# Patient Record
Sex: Male | Born: 1965 | Race: White | Hispanic: No | Marital: Married | Smoking: Former smoker
Health system: Southern US, Community
[De-identification: ages and names within clinical notes are randomized; demographics above are authoritative.]

## PROBLEM LIST (undated history)

## (undated) DIAGNOSIS — M1990 Unspecified osteoarthritis, unspecified site: Secondary | ICD-10-CM

## (undated) DIAGNOSIS — K5792 Diverticulitis of intestine, part unspecified, without perforation or abscess without bleeding: Secondary | ICD-10-CM

## (undated) DIAGNOSIS — M519 Unspecified thoracic, thoracolumbar and lumbosacral intervertebral disc disorder: Secondary | ICD-10-CM

## (undated) DIAGNOSIS — S82851A Displaced trimalleolar fracture of right lower leg, initial encounter for closed fracture: Secondary | ICD-10-CM

## (undated) HISTORY — PX: TOTAL HIP ARTHROPLASTY: SHX124

## (undated) HISTORY — PX: ABDOMINAL SURGERY: SHX537

---

## 2013-02-15 ENCOUNTER — Emergency Department (HOSPITAL_COMMUNITY): Payer: Self-pay

## 2013-02-15 ENCOUNTER — Telehealth (HOSPITAL_COMMUNITY): Payer: Self-pay | Admitting: Emergency Medicine

## 2013-02-15 ENCOUNTER — Encounter (HOSPITAL_COMMUNITY): Payer: Self-pay | Admitting: Emergency Medicine

## 2013-02-15 ENCOUNTER — Emergency Department (HOSPITAL_COMMUNITY)
Admission: EM | Admit: 2013-02-15 | Discharge: 2013-02-15 | Disposition: A | Discharge status: Home / Self Care | Payer: Self-pay | Attending: Emergency Medicine | Admitting: Emergency Medicine

## 2013-02-15 DIAGNOSIS — Z8739 Personal history of other diseases of the musculoskeletal system and connective tissue: Secondary | ICD-10-CM | POA: Insufficient documentation

## 2013-02-15 DIAGNOSIS — M545 Low back pain: Secondary | ICD-10-CM

## 2013-02-15 DIAGNOSIS — IMO0002 Reserved for concepts with insufficient information to code with codable children: Secondary | ICD-10-CM | POA: Insufficient documentation

## 2013-02-15 DIAGNOSIS — M129 Arthropathy, unspecified: Secondary | ICD-10-CM | POA: Insufficient documentation

## 2013-02-15 HISTORY — DX: Unspecified osteoarthritis, unspecified site: M19.90

## 2013-02-15 HISTORY — DX: Unspecified thoracic, thoracolumbar and lumbosacral intervertebral disc disorder: M51.9

## 2013-02-15 HISTORY — DX: Diverticulitis of intestine, part unspecified, without perforation or abscess without bleeding: K57.92

## 2013-02-15 MED ORDER — OXYCODONE-ACETAMINOPHEN 5-325 MG PO TABS
2.0000 | ORAL_TABLET | Freq: Once | ORAL | Status: AC
  Administered 2013-02-15: 2 via ORAL
  Filled 2013-02-15: qty 2

## 2013-02-15 MED ORDER — KETOROLAC TROMETHAMINE 60 MG/2ML IM SOLN
60.0000 mg | Freq: Once | INTRAMUSCULAR | Status: AC
  Administered 2013-02-15: 60 mg via INTRAMUSCULAR
  Filled 2013-02-15: qty 2

## 2013-02-15 MED ORDER — PREDNISONE 20 MG PO TABS: 40.0000 mg | ORAL_TABLET | Freq: Every day | ORAL | Status: DC

## 2013-02-15 MED ORDER — DIAZEPAM 5 MG PO TABS
5.0000 mg | ORAL_TABLET | Freq: Once | ORAL | Status: AC
  Administered 2013-02-15: 5 mg via ORAL
  Filled 2013-02-15: qty 1

## 2013-02-15 MED ORDER — CYCLOBENZAPRINE HCL 10 MG PO TABS: 10.0000 mg | ORAL_TABLET | Freq: Once | ORAL | Status: DC

## 2013-02-15 MED ORDER — OXYCODONE-ACETAMINOPHEN 5-325 MG PO TABS: 2.0000 | ORAL_TABLET | Freq: Once | ORAL | Status: DC

## 2013-02-15 MED ORDER — CYCLOBENZAPRINE HCL 10 MG PO TABS
10.0000 mg | ORAL_TABLET | Freq: Once | ORAL | Status: AC
  Administered 2013-02-15: 10 mg via ORAL
  Filled 2013-02-15: qty 1

## 2013-02-15 NOTE — ED Provider Notes (Signed)
History    CSN: [id number] Arrival date & time 02/15/13  4098  First MD Initiated Contact with Patient 02/15/13 1010     Chief Complaint  Patient presents with  . Back Pain  . Leg Pain    right leg   (Consider location/radiation/quality/duration/timing/severity/associated sxs/prior Treatment) HPI Comments: Patient is a 47 year old male with a past medical history of a ruptured disc who presents with gradual onset of lower back pain that started 4 days ago. The pain is sharp and severe and radiates down bilateral legs. The pain is constant. Movement makes the pain worse. Nothing makes the pain better. Patient has tried tylenol and ibuprofen for pain without relief. No associated symptoms. No saddles paresthesias or bladder/bowel incontinence.     Past Medical History  Diagnosis Date  . Ruptured disk   . Diverticulitis   . Arthritis    Past Surgical History  Procedure Laterality Date  . Abdominal surgery     No family history on file. History  Substance Use Topics  . Smoking status: Never Smoker   . Smokeless tobacco: Not on file  . Alcohol Use: No    Review of Systems  Musculoskeletal: Positive for back pain.  All other systems reviewed and are negative.    Allergies  Review of patient's allergies indicates no known allergies.  Home Medications   Current Outpatient Rx  Name  Route  Sig  Dispense  Refill  . acetaminophen (TYLENOL) 500 MG tablet   Oral   Take 1,000 mg by mouth every 6 (six) hours as needed for pain.         Marland Kitchen ibuprofen (ADVIL,MOTRIN) 200 MG tablet   Oral   Take 800 mg by mouth every 6 (six) hours as needed for pain.         Marland Kitchen senna (SENOKOT) 8.6 MG tablet   Oral   Take 2 tablets by mouth 2 (two) times daily.          BP 165/85  Pulse 62  Temp(Src) 97 F (36.1 C) (Oral)  Resp 20  Ht 6' (1.829 m)  Wt 240 lb (108.863 kg)  BMI 32.54 kg/m2  SpO2 99% Physical Exam  Nursing note and vitals reviewed. Constitutional: He appears  well-developed and well-nourished. No distress.  HENT:  Head: Normocephalic and atraumatic.  Eyes: Conjunctivae and EOM are normal.  Neck: Normal range of motion.  Cardiovascular: Normal rate and regular rhythm.  Exam reveals no gallop and no friction rub.   No murmur heard. Pulmonary/Chest: Effort normal and breath sounds normal. He has no wheezes. He has no rales. He exhibits no tenderness.  Abdominal: Soft. He exhibits no distension. There is no tenderness. There is no rebound.  Musculoskeletal: Normal range of motion.  No midline spine tenderness to palpation. No paraspinal tenderness to palpation.   Neurological: He is alert. Coordination normal.  Lower extremity strength and sensation equal and intact bilaterally. Speech is goal-oriented. Moves limbs without ataxia.   Skin: Skin is warm and dry.  Psychiatric: He has a normal mood and affect. His behavior is normal.    ED Course  Procedures (including critical care time) Labs Reviewed - No data to display Dg Lumbar Spine Complete  02/15/2013   *RADIOLOGY REPORT*  Clinical Data: Back pain  LUMBAR SPINE - COMPLETE 4+ VIEW  Comparison: None  Findings: There is a slight curvature of the lumbar spine which is convex towards the left.  There is mild multilevel disc space narrowing and  ventral endplate spurring noted.  This is most severe at L5 S1.  There is no fracture or subluxation identified.  IMPRESSION:  1.  Lumbar degenerative disc disease. 2.  No acute findings noted.   Original Report Authenticated By: Signa Kell, M.D.   1. Low back pain     MDM  10:55 AM Patient will have toradol and valium for pain. Lumbar spine xray pending.   11:22 AM Xray shows no acute changes. No bladder/bowel incontinence or saddle paresthesias. Patient denies relief with Toradol and valium. Patient will have Percocet and Flexeril. Patient will follow up with Dr. Jeral Fruit. Patient instructed to return with worsening or concerning symptoms.   Emilia Beck, PA-C 02/15/13 1131

## 2013-02-15 NOTE — ED Notes (Signed)
Pt discharged.Vital signs stable and GCS 15.Pt still complaining of pain on discharge.

## 2013-02-15 NOTE — ED Notes (Signed)
Pt reports low back pain that radiates down right leg onset Thursday. Pt reports that when he bent over the pain began. Pt has history of back problems. Pt ambulatory with crutches.

## 2013-02-15 NOTE — ED Provider Notes (Signed)
Medical screening examination/treatment/procedure(s) were performed by non-physician practitioner and as supervising physician I was immediately available for consultation/collaboration.    Tereso Unangst D Briyonna Omara, MD 02/15/13 1256 

## 2013-02-19 ENCOUNTER — Other Ambulatory Visit: Payer: Self-pay | Admitting: Neurosurgery

## 2013-02-19 DIAGNOSIS — M545 Low back pain: Secondary | ICD-10-CM

## 2013-02-20 ENCOUNTER — Ambulatory Visit
Admission: RE | Admit: 2013-02-20 | Discharge: 2013-02-20 | Disposition: A | Discharge status: Home / Self Care | Payer: No Typology Code available for payment source | Source: Ambulatory Visit | Attending: Neurosurgery | Admitting: Neurosurgery

## 2013-02-20 DIAGNOSIS — M545 Low back pain: Secondary | ICD-10-CM

## 2013-02-20 MED ORDER — IOHEXOL 180 MG/ML  SOLN
1.0000 mL | Freq: Once | INTRAMUSCULAR | Status: AC | PRN
  Administered 2013-02-20: 1 mL via EPIDURAL

## 2013-02-20 MED ORDER — METHYLPREDNISOLONE ACETATE 40 MG/ML INJ SUSP (RADIOLOG
120.0000 mg | Freq: Once | INTRAMUSCULAR | Status: AC
  Administered 2013-02-20: 120 mg via EPIDURAL

## 2013-11-09 ENCOUNTER — Other Ambulatory Visit: Payer: Self-pay | Admitting: Neurosurgery

## 2013-11-09 DIAGNOSIS — M545 Low back pain, unspecified: Secondary | ICD-10-CM

## 2013-11-11 ENCOUNTER — Ambulatory Visit
Admission: RE | Admit: 2013-11-11 | Discharge: 2013-11-11 | Disposition: A | Discharge status: Home / Self Care | Payer: No Typology Code available for payment source | Source: Ambulatory Visit | Attending: Neurosurgery | Admitting: Neurosurgery

## 2013-11-11 DIAGNOSIS — M545 Low back pain, unspecified: Secondary | ICD-10-CM

## 2013-11-11 MED ORDER — METHYLPREDNISOLONE ACETATE 40 MG/ML INJ SUSP (RADIOLOG
120.0000 mg | Freq: Once | INTRAMUSCULAR | Status: AC
  Administered 2013-11-11: 120 mg via EPIDURAL

## 2013-11-11 MED ORDER — IOHEXOL 180 MG/ML  SOLN
1.0000 mL | Freq: Once | INTRAMUSCULAR | Status: AC | PRN
  Administered 2013-11-11: 1 mL via EPIDURAL

## 2013-11-11 NOTE — Discharge Instructions (Signed)

## 2014-08-18 ENCOUNTER — Telehealth: Payer: Self-pay | Admitting: Family Medicine

## 2014-08-18 NOTE — Telephone Encounter (Signed)
Pt has [employer] no current meds that he takes regularly, needs CPE, pt given appt with Dr.Stacks 09/17/14 at 10:55, pt aware to arrive 15 minutes prior to appt with a copy of his insurance card.

## 2014-09-17 ENCOUNTER — Telehealth: Payer: Self-pay

## 2014-09-17 ENCOUNTER — Ambulatory Visit (INDEPENDENT_AMBULATORY_CARE_PROVIDER_SITE_OTHER): Payer: 59 | Admitting: Family Medicine

## 2014-09-17 ENCOUNTER — Encounter: Payer: Self-pay | Admitting: Family Medicine

## 2014-09-17 VITALS — BP 136/87 | HR 66 | Temp 97.0°F | Ht 71.5 in | Wt 265.8 lb

## 2014-09-17 DIAGNOSIS — Z1212 Encounter for screening for malignant neoplasm of rectum: Secondary | ICD-10-CM

## 2014-09-17 DIAGNOSIS — E669 Obesity, unspecified: Secondary | ICD-10-CM | POA: Diagnosis not present

## 2014-09-17 DIAGNOSIS — M5431 Sciatica, right side: Secondary | ICD-10-CM

## 2014-09-17 DIAGNOSIS — Z0000 Encounter for general adult medical examination without abnormal findings: Secondary | ICD-10-CM

## 2014-09-17 MED ORDER — CELECOXIB 400 MG PO CAPS: 400.0000 mg | ORAL_CAPSULE | Freq: Every day | ORAL | Status: DC

## 2014-09-17 MED ORDER — BETAMETHASONE SOD PHOS & ACET 6 (3-3) MG/ML IJ SUSP
6.0000 mg | Freq: Once | INTRAMUSCULAR | Status: AC
  Administered 2014-09-17: 6 mg via INTRAMUSCULAR

## 2014-09-17 NOTE — Addendum Note (Signed)
Addended by: Selmer Dominion on: 09/17/2014 02:14 PM   Modules accepted: Orders

## 2014-09-17 NOTE — Telephone Encounter (Signed)
[facility] to x-ray for referral  To [facility].

## 2014-09-17 NOTE — Progress Notes (Signed)
Subjective:  Patient ID: Aaron Krause, male    DOB: [birth year]  Age: 49 y.o. MRN: [id number]  CC: Annual Exam   HPI Aaron Krause presents for sciatica down RLE also bone spurs in Right hip. Has questions about weight control. Starting a new plant-based diet. Con not bend or lift. 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 Has neurosurgical eval stating he has 2 herniated discs that can be repaired. Pt. Was advised to wait as long as possible due to complexity of the surgery. States he uses ibuprofen rarely due to concern for his stomach. Uses oxy less because he has watched his bro-in law become an addict.Pain is moderate in severity. Waxes and wanes.  History Aaron Krause has a past medical history of Ruptured disk; Diverticulitis; and Arthritis.   He has past surgical history that includes Abdominal surgery.   His family history is not on file.He reports that he quit smoking about 5 years ago. His smoking use included Cigarettes. He started smoking about 21 years ago. He smoked 1.00 pack per day. He does not have any smokeless tobacco history on file. He reports that he does not drink alcohol or use illicit drugs.  Current Outpatient Prescriptions on File Prior to Visit  Medication Sig Dispense Refill  . acetaminophen (TYLENOL) 500 MG tablet Take 1,000 mg by mouth every 6 (six) hours as needed for pain.    . cyclobenzaprine (FLEXERIL) 10 MG tablet Take 1 tablet (10 mg total) by mouth once. 20 tablet 0  . oxyCODONE-acetaminophen (PERCOCET/ROXICET) 5-325 MG per tablet Take 2 tablets by mouth once. 20 tablet 0  . senna (SENOKOT) 8.6 MG tablet Take 2 tablets by mouth 2 (two) times daily.     No current facility-administered medications on file prior to visit.    ROS Review of Systems  Constitutional: Negative for fever, chills, diaphoresis, activity change, appetite change, fatigue and unexpected weight change.  HENT: Negative for congestion, ear pain, hearing  loss, postnasal drip, rhinorrhea, sore throat, tinnitus and trouble swallowing.   Eyes: Negative for photophobia, pain, discharge and redness.  Respiratory: Negative for apnea, cough, choking, chest tightness, shortness of breath, wheezing and stridor.   Cardiovascular: Negative for chest pain, palpitations and leg swelling.  Gastrointestinal: Negative for nausea, vomiting, abdominal pain, diarrhea, constipation, blood in stool and abdominal distention.  Endocrine: Negative for cold intolerance, heat intolerance, polydipsia, polyphagia and polyuria.  Genitourinary: Negative for dysuria, urgency, frequency, hematuria, flank pain, enuresis, difficulty urinating and genital sores.  Musculoskeletal: Positive for back pain (see history of present illness). Negative for joint swelling.  Skin: Negative for color change, rash and wound.  Allergic/Immunologic: Negative for immunocompromised state.  Neurological: Negative for dizziness, tremors, seizures, syncope, facial asymmetry, speech difficulty, weakness, light-headedness, numbness and headaches.  Hematological: Does not bruise/bleed easily.  Psychiatric/Behavioral: Negative for suicidal ideas, hallucinations, behavioral problems, confusion, sleep disturbance, dysphoric mood, decreased concentration and agitation. The patient is not nervous/anxious and is not hyperactive.     Objective:  BP 136/87 mmHg  Pulse 66  Temp(Src) 97 F (36.1 C) (Oral)  Ht 5' 11.5" (1.816 m)  Wt 265 lb 12.8 oz (120.566 kg)  BMI 36.56 kg/m2  BP Readings from Last 3 Encounters:  09/17/14 136/87  11/11/13 157/76  02/20/13 155/75    Wt Readings from Last 3 Encounters:  09/17/14 265 lb 12.8 oz (120.566 kg)  02/15/13 240 lb (108.863 kg)     Physical Exam  Constitutional: He is oriented  to person, place, and time. He appears well-developed and well-nourished.  HENT:  Head: Normocephalic and atraumatic.  Mouth/Throat: Oropharynx is clear and moist.  Eyes: EOM are  normal. Pupils are equal, round, and reactive to light.  Neck: Normal range of motion. No tracheal deviation present. No thyromegaly present.  Cardiovascular: Normal rate, regular rhythm and normal heart sounds.  Exam reveals no gallop and no friction rub.   No murmur heard. Pulmonary/Chest: Breath sounds normal. He has no wheezes. He has no rales.  Abdominal: Soft. He exhibits no mass. There is no tenderness.  Genitourinary: Rectum normal, prostate normal and penis normal.  Musculoskeletal: Normal range of motion. He exhibits no edema.  Tenderness in lumbar paraspinous musculature is moderate. Straight leg raise is markedly positive at about 15 on the right.  Neurological: He is alert and oriented to person, place, and time.  Skin: Skin is warm and dry.  Psychiatric: He has a normal mood and affect.    No results found for: HGBA1C  No results found for: WBC, HGB, HCT, PLT, GLUCOSE, CHOL, TRIG, HDL, LDLDIRECT, LDLCALC, ALT, AST, NA, K, CL, CREATININE, BUN, CO2, TSH, PSA, INR, GLUF, HGBA1C, MICROALBUR  Dg Epidurography  11/11/2013   CLINICAL DATA:  Lumbosacral spondylosis without myelopathy. Significant relief after the previous injection, without side effect or complication. Low back and left lower extremity pain. Partial recurrence of symptoms. The patient wishes to repeat.  The procedure, risks, benefits, and alternatives were explained to the patient. Questions regarding the procedure were encouraged and answered. The patient understands and consents to the procedure.  PROCEDURE: LUMBAR EPIDURAL INJECTION: An interlaminar approach was performed on the left at L5-S1. Operator donned sterile gloves and mask. The overlying skin was cleansed and anesthetized. A 20 gauge Crawford epidural needle was advanced using loss-of-resistance technique.  DIAGNOSTIC EPIDURAL INJECTION: Injection of Omnipaque 180 shows a good epidural pattern with spread above and below the level of needle placement. No  intrathecal or vascular opacification is seen.  THERAPEUTIC EPIDURAL INJECTION: 120mg  of Depo-Medrol mixed with 5ml lidocaine 1% were instilled. The procedure was well-tolerated, and the patient was discharged thirty minutes following the injection in good condition.  FLUOROSCOPY TIME:  19 seconds  COMPLICATIONS: None  IMPRESSION: Technically successful epidural injection on the left at L5-S1.   Electronically Signed   By: Arne Cleveland M.D.   On: 11/11/2013 14:41    Assessment & Plan:   Aaron Krause was seen today for annual exam.  Diagnoses and all orders for this visit:  Wellness examination Orders: -     Cancel: POCT CBC -     CMP14+EGFR -     PSA, total and free -     Vit D  25 hydroxy (rtn osteoporosis monitoring) -     Thyroid Panel With TSH -     NMR, lipoprofile  Sciatica associated with disorder of lumbar spine, right Orders: -     Ambulatory referral to Orthopedic Surgery -     betamethasone acetate-betamethasone sodium phosphate (CELESTONE) injection 6 mg; Inject 1 mL (6 mg total) into the muscle once.  Other orders -     celecoxib (CELEBREX) 400 MG capsule; Take 1 capsule (400 mg total) by mouth daily after breakfast.   I have discontinued Mr. Gahm ibuprofen and predniSONE. I am also having him start on celecoxib. Additionally, I am having him maintain his senna, acetaminophen, cyclobenzaprine, and oxyCODONE-acetaminophen. We administered betamethasone acetate-betamethasone sodium phosphate.  Meds ordered this encounter  Medications  . celecoxib (  CELEBREX) 400 MG capsule    Sig: Take 1 capsule (400 mg total) by mouth daily after breakfast.    Dispense:  30 capsule    Refill:  5  . betamethasone acetate-betamethasone sodium phosphate (CELESTONE) injection 6 mg    Sig:     Comments: Return here after released by orthopedics or 6 months whichever is first.  Follow-up: Return in about 6 months (around 03/18/2015).  Claretta Fraise, M.D.

## 2014-09-18 LAB — CMP14+EGFR
ALBUMIN: 4.9 g/dL (ref 3.5–5.5)
ALT: 23 IU/L (ref 0–44)
AST: 24 IU/L (ref 0–40)
Albumin/Globulin Ratio: 1.8 (ref 1.1–2.5)
Alkaline Phosphatase: 63 IU/L (ref 39–117)
BUN / CREAT RATIO: 15 (ref 9–20)
BUN: 14 mg/dL (ref 6–24)
Bilirubin Total: 0.8 mg/dL (ref 0.0–1.2)
CHLORIDE: 101 mmol/L (ref 97–108)
CO2: 24 mmol/L (ref 18–29)
CREATININE: 0.92 mg/dL (ref 0.76–1.27)
Calcium: 9.6 mg/dL (ref 8.7–10.2)
GFR calc Af Amer: 115 mL/min/{1.73_m2} (ref >59)
GFR calc non Af Amer: 99 mL/min/{1.73_m2} (ref >59)
GLOBULIN, TOTAL: 2.8 g/dL (ref 1.5–4.5)
GLUCOSE: 75 mg/dL (ref 65–99)
Potassium: 4.4 mmol/L (ref 3.5–5.2)
Sodium: 142 mmol/L (ref 134–144)
Total Protein: 7.7 g/dL (ref 6.0–8.5)

## 2014-09-18 LAB — NMR, LIPOPROFILE
CHOLESTEROL: 182 mg/dL (ref 100–199)
HDL Cholesterol by NMR: 48 mg/dL (ref >39)
HDL PARTICLE NUMBER: 33.3 umol/L (ref >=30.5)
LDL PARTICLE NUMBER: 1324 nmol/L — AB (ref <1000)
LDL Size: 20.4 nm (ref >20.5)
LDL-C: 115 mg/dL — ABNORMAL HIGH (ref 0–99)
LP-IR SCORE: 51 — AB (ref <=45)
SMALL LDL PARTICLE NUMBER: 618 nmol/L — AB (ref <=527)
TRIGLYCERIDES BY NMR: 94 mg/dL (ref 0–149)

## 2014-09-18 LAB — THYROID PANEL WITH TSH
Free Thyroxine Index: 2.5 (ref 1.2–4.9)
T3 UPTAKE RATIO: 27 % (ref 24–39)
T4, Total: 9.1 ug/dL (ref 4.5–12.0)
TSH: 1.58 u[IU]/mL (ref 0.450–4.500)

## 2014-09-18 LAB — PSA, TOTAL AND FREE
PSA FREE PCT: 40 %
PSA, Free: 0.08 ng/mL
PSA: 0.2 ng/mL (ref 0.0–4.0)

## 2014-09-18 LAB — VITAMIN D 25 HYDROXY (VIT D DEFICIENCY, FRACTURES): VIT D 25 HYDROXY: 21.4 ng/mL — AB (ref 30.0–100.0)

## 2014-09-19 LAB — FECAL OCCULT BLOOD, IMMUNOCHEMICAL: Fecal Occult Bld: POSITIVE — AB

## 2014-09-21 ENCOUNTER — Telehealth: Payer: Self-pay | Admitting: *Deleted

## 2014-09-21 NOTE — Telephone Encounter (Signed)
-----   Message from Claretta Fraise, MD sent at 09/19/2014  3:31 PM EST ----- Dear Aaron Krause,   Your Vitamin D is quite low. You need a high dose supplement. Use 50,000 units twice weekly for two months. I will send that in as a 1 time only prescription.  Then switch to OTC Vitamin D 2000 units daily. Recheck vitamin D level with office visit in 6 months. The cholesterol profile is excellent. Minimal elevation of LDH particles is insignificant. The remainder of the blood work looks good. The only other concern is that there was some blood in the stool specimen. I recommend seeing a gastroenterologist to consider colonoscopy.    Best regards, Claretta Fraise M.D.

## 2014-09-21 NOTE — Telephone Encounter (Signed)
Patient aware of labs and rx

## 2015-03-18 ENCOUNTER — Encounter: Payer: Self-pay | Admitting: Family Medicine

## 2015-03-18 ENCOUNTER — Ambulatory Visit (INDEPENDENT_AMBULATORY_CARE_PROVIDER_SITE_OTHER): Payer: 59 | Admitting: Family Medicine

## 2015-03-18 VITALS — BP 119/70 | HR 65 | Temp 97.6°F | Ht 71.5 in | Wt 244.4 lb

## 2015-03-18 DIAGNOSIS — M5431 Sciatica, right side: Secondary | ICD-10-CM | POA: Diagnosis not present

## 2015-03-18 DIAGNOSIS — Z139 Encounter for screening, unspecified: Secondary | ICD-10-CM

## 2015-03-18 MED ORDER — CYCLOBENZAPRINE HCL 10 MG PO TABS: 10.0000 mg | ORAL_TABLET | Freq: Three times a day (TID) | ORAL | Status: AC | PRN

## 2015-03-18 MED ORDER — BETAMETHASONE SOD PHOS & ACET 6 (3-3) MG/ML IJ SUSP
6.0000 mg | Freq: Once | INTRAMUSCULAR | Status: AC
  Administered 2015-03-18: 6 mg via INTRAMUSCULAR

## 2015-03-18 NOTE — Progress Notes (Signed)
Subjective:  Patient ID: Aaron Krause, male    DOB: [birth year]  Age: 49 y.o. MRN: [id number]  CC: Sciatica and Vit D deficiency   HPI Aaron Krause presents for ruptured discs. Sees Neurosurg.Aaron Krause. Recently more pain. 5-6/10 pain. Worse lifting over 40 lb. Relief with ice & rest in the evening. Had cortisone shot a year ago.  Needs referral. Needs GI referral also. Good relief with celebrex. Had to DC due to pending Oral Surgery.   History Aaron Krause has a past medical history of Ruptured disk; Diverticulitis; and Arthritis.   He has past surgical history that includes Abdominal surgery.   His family history is not on file.He reports that he quit smoking about 5 years ago. His smoking use included Cigarettes. He started smoking about 21 years ago. He smoked 1.00 pack per day. He does not have any smokeless tobacco history on file. He reports that he does not drink alcohol or use illicit drugs.  Outpatient Prescriptions Prior to Visit  Medication Sig Dispense Refill  . acetaminophen (TYLENOL) 500 MG tablet Take 1,000 mg by mouth every 6 (six) hours as needed for pain.    Marland Kitchen senna (SENOKOT) 8.6 MG tablet Take 2 tablets by mouth 2 (two) times daily.    . cyclobenzaprine (FLEXERIL) 10 MG tablet Take 1 tablet (10 mg total) by mouth once. 20 tablet 0  . celecoxib (CELEBREX) 400 MG capsule Take 1 capsule (400 mg total) by mouth daily after breakfast. (Patient not taking: Reported on 03/18/2015) 30 capsule 5  . oxyCODONE-acetaminophen (PERCOCET/ROXICET) 5-325 MG per tablet Take 2 tablets by mouth once. (Patient not taking: Reported on 03/18/2015) 20 tablet 0   No facility-administered medications prior to visit.    ROS Review of Systems  Constitutional: Negative for fever, chills and diaphoresis.  HENT: Negative for congestion, rhinorrhea and sore throat.   Respiratory: Negative for cough, shortness of breath and wheezing.   Cardiovascular: Negative for chest pain.    Gastrointestinal: Negative for nausea, vomiting, abdominal pain, diarrhea, constipation and abdominal distention.  Genitourinary: Negative for dysuria and frequency.  Musculoskeletal: Negative for joint swelling and arthralgias.  Skin: Negative for rash.  Neurological: Negative for headaches.    Objective:  BP 119/70 mmHg  Pulse 65  Temp(Src) 97.6 F (36.4 C) (Oral)  Ht 5' 11.5" (1.816 m)  Wt 244 lb 6.4 oz (110.859 kg)  BMI 33.62 kg/m2  BP Readings from Last 3 Encounters:  03/18/15 119/70  09/17/14 136/87  11/11/13 157/76    Wt Readings from Last 3 Encounters:  03/18/15 244 lb 6.4 oz (110.859 kg)  09/17/14 265 lb 12.8 oz (120.566 kg)  02/15/13 240 lb (108.863 kg)     Physical Exam  Constitutional: He is oriented to person, place, and time. He appears well-developed and well-nourished. He appears distressed (mild, due to pain).  HENT:  Head: Normocephalic and atraumatic.  Right Ear: External ear normal.  Left Ear: External ear normal.  Nose: Nose normal.  Mouth/Throat: Oropharynx is clear and moist.  Eyes: Conjunctivae and EOM are normal. Pupils are equal, round, and reactive to light.  Neck: Normal range of motion. Neck supple. No thyromegaly present.  Cardiovascular: Normal rate, regular rhythm and normal heart sounds.   No murmur heard. Pulmonary/Chest: Effort normal and breath sounds normal. No respiratory distress. He has no wheezes. He has no rales.  Abdominal: Soft. Bowel sounds are normal. He exhibits no distension. There is no tenderness.  Musculoskeletal: Normal range of motion. He exhibits tenderness (  RLB). He exhibits no edema.  Lymphadenopathy:    He has no cervical adenopathy.  Neurological: He is alert and oriented to person, place, and time. He has normal reflexes.  Skin: Skin is warm and dry.  Psychiatric: He has a normal mood and affect. His behavior is normal. Judgment and thought content normal.    No results found for: HGBA1C  Lab Results   Component Value Date   GLUCOSE 75 09/17/2014   CHOL 182 09/17/2014   TRIG 94 09/17/2014   HDL 48 09/17/2014   ALT 23 09/17/2014   AST 24 09/17/2014   NA 142 09/17/2014   K 4.4 09/17/2014   CL 101 09/17/2014   CREATININE 0.92 09/17/2014   BUN 14 09/17/2014   CO2 24 09/17/2014   TSH 1.580 09/17/2014   PSA 0.2 09/17/2014    Dg Epidurography  11/11/2013   CLINICAL DATA:  Lumbosacral spondylosis without myelopathy. Significant relief after the previous injection, without side effect or complication. Low back and left lower extremity pain. Partial recurrence of symptoms. The patient wishes to repeat.  The procedure, risks, benefits, and alternatives were explained to the patient. Questions regarding the procedure were encouraged and answered. The patient understands and consents to the procedure.  PROCEDURE: LUMBAR EPIDURAL INJECTION: An interlaminar approach was performed on the left at L5-S1. Operator donned sterile gloves and mask. The overlying skin was cleansed and anesthetized. A 20 gauge Crawford epidural needle was advanced using loss-of-resistance technique.  DIAGNOSTIC EPIDURAL INJECTION: Injection of Omnipaque 180 shows a good epidural pattern with spread above and below the level of needle placement. No intrathecal or vascular opacification is seen.  THERAPEUTIC EPIDURAL INJECTION: 120mg  of Depo-Medrol mixed with 10ml lidocaine 1% were instilled. The procedure was well-tolerated, and the patient was discharged thirty minutes following the injection in good condition.  FLUOROSCOPY TIME:  19 seconds  COMPLICATIONS: None  IMPRESSION: Technically successful epidural injection on the left at L5-S1.   Electronically Signed   By: Arne Cleveland M.D.   On: 11/11/2013 14:41    Assessment & Plan:   [name] was seen today for sciatica and vit d deficiency.  Diagnoses and all orders for this visit:  Screening -     Ambulatory referral to Gastroenterology  Sciatica, right -     betamethasone  acetate-betamethasone sodium phosphate (CELESTONE) injection 6 mg; Inject 1 mL (6 mg total) into the muscle once. -     Ambulatory referral to Neurosurgery  Other orders -     cyclobenzaprine (FLEXERIL) 10 MG tablet; Take 1 tablet (10 mg total) by mouth 3 (three) times daily as needed for muscle spasms.   I have discontinued Mr. Ledvina cyclobenzaprine. I am also having him start on cyclobenzaprine. Additionally, I am having him maintain his senna, acetaminophen, oxyCODONE-acetaminophen, and celecoxib. We administered betamethasone acetate-betamethasone sodium phosphate.  Meds ordered this encounter  Medications  . betamethasone acetate-betamethasone sodium phosphate (CELESTONE) injection 6 mg    Sig:   . cyclobenzaprine (FLEXERIL) 10 MG tablet    Sig: Take 1 tablet (10 mg total) by mouth 3 (three) times daily as needed for muscle spasms.    Dispense:  90 tablet    Refill:  1     Follow-up: Return in about 6 months (around 09/18/2015) for CPE.  Claretta Fraise, M.D.

## 2015-03-21 ENCOUNTER — Encounter: Payer: Self-pay | Admitting: Gastroenterology

## 2015-05-03 ENCOUNTER — Encounter: Payer: Self-pay | Admitting: Gastroenterology

## 2015-05-03 ENCOUNTER — Ambulatory Visit (INDEPENDENT_AMBULATORY_CARE_PROVIDER_SITE_OTHER): Payer: 59 | Admitting: Gastroenterology

## 2015-05-03 VITALS — BP 142/80 | HR 84 | Ht 71.5 in | Wt 248.2 lb

## 2015-05-03 DIAGNOSIS — K5732 Diverticulitis of large intestine without perforation or abscess without bleeding: Secondary | ICD-10-CM | POA: Diagnosis not present

## 2015-05-03 DIAGNOSIS — R195 Other fecal abnormalities: Secondary | ICD-10-CM

## 2015-05-03 NOTE — Patient Instructions (Signed)

## 2015-05-03 NOTE — Progress Notes (Signed)
HPI :  49 y/o male seen in consultation for Dr. Claretta Fraise for history of diverticulitis.   Patient reports he was diagnosed with diverticulitis in 2014, at which time he developed fever and acute onset of lower abdominal pain. He presented to [facility] and a CT scan reportedly showed he had a colonic perforation and led to operation for drain placement and ABx but did not have it repaired, was told it was a "small tear". He reports he has not seen GI for a follow up since that time. No prior colonoscopy. He is unclear where in his colon the diverticulitis occurred. He was hospitalized for several days and recovered without event. He denies any recurrence of diverticulitis since his initial episode and operation. He had a positive FOBT in February 2016.   He reports he has some intermittent lower right abdominal discomfort if he becomes constipated, but at baseline denies abdominal pains. He takes senna routinely for constipation. He has occasional discomfort in the RLQ. He has roughly 1-2 BMs per day. No blood in the stools that he sees but endorses some blood noted on the toilet paper when he wipes himself. No weight loss. No family history of colon cancer. Father had diverticulitis. He has chronic back pain and hip pain for which he takes celebrex.     Past Medical History  Diagnosis Date  . Ruptured disk   . Diverticulitis   . Arthritis      Past Surgical History  Procedure Laterality Date  . Abdominal surgery     History reviewed. No pertinent family history. Social History  Substance Use Topics  . Smoking status: Former Smoker -- 1.00 packs/day    Types: Cigarettes    Start date: 09/17/1993    Quit date: 09/17/2009  . Smokeless tobacco: None  . Alcohol Use: No   Current Outpatient Prescriptions  Medication Sig Dispense Refill  . acetaminophen (TYLENOL) 500 MG tablet Take 1,000 mg by mouth every 6 (six) hours as needed for pain.    . celecoxib (CELEBREX) 400 MG capsule  Take 1 capsule (400 mg total) by mouth daily after breakfast. 30 capsule 5  . cyclobenzaprine (FLEXERIL) 10 MG tablet Take 1 tablet (10 mg total) by mouth 3 (three) times daily as needed for muscle spasms. 90 tablet 1  . oxyCODONE-acetaminophen (PERCOCET/ROXICET) 5-325 MG per tablet Take 2 tablets by mouth once. 20 tablet 0  . senna (SENOKOT) 8.6 MG tablet Take 2 tablets by mouth 2 (two) times daily.     No current facility-administered medications for this visit.   No Known Allergies   Review of Systems: All systems reviewed and negative except where noted in HPI.   February 2016 - positive FOBT  Physical Exam: BP 142/80 mmHg  Pulse 84  Ht 5' 11.5" (1.816 m)  Wt 248 lb 4 oz (112.605 kg)  BMI 34.14 kg/m2 Constitutional: Pleasant,well-developed, male in no acute distress. HEENT: Normocephalic and atraumatic. Conjunctivae are normal. No scleral icterus. Neck supple.  Cardiovascular: Normal rate, regular rhythm.  Pulmonary/chest: Effort normal and breath sounds normal. No wheezing, rales or rhonchi. Abdominal: Soft, nondistended, nontender. Bowel sounds active throughout. There are no masses palpable. No hepatomegaly. Extremities: no edema Lymphadenopathy: No cervical adenopathy noted. Neurological: Alert and oriented to person place and time. Skin: Skin is warm and dry. No rashes noted. Psychiatric: Normal mood and affect. Behavior is normal.   ASSESSMENT AND PLAN: 49 y/o male seen in consultation for reported history of complicated diverticulitis in 2014  associated with colonic perforation leading to surgery, who has not had a prior colonoscopy, and had a positive FOBT earlier this year. He otherwise has some intermittent lower abdominal discomfort and sees some blood noted on the toilet paper but not in the stools.   I discussed diverticulosis with the patient, natural history, and associated risks of diverticulitis and bleeding. Unclear where in his colon he suffered this  perforation but have requested his records from [facility] to clarify. Regardless, I discussed the indications he has at this time for a colonoscopy (reported diverticulitis, and blood in the stools). The indications, risks, and benefits of colonoscopy were explained to the patient in detail. Risks include but are not limited to bleeding, perforation, adverse reaction to medications, and cardiopulmonary compromise. Sequelae include but are not limited to the possibility of surgery, hospitalization, and mortality. The patient verbalized understanding and wished to proceed. All questions answered, referred to the scheduler and bowel prep ordered. Further recommendations pending results of the exam. We will otherwise await records from [facility]. Should he have any recurrence of symptoms of diverticulitis in the interim I asked him to contact me immediately for evaluation.   Eureka Cellar, MD [facility]   CC: Dr. Claretta Fraise

## 2015-05-05 ENCOUNTER — Other Ambulatory Visit: Payer: Self-pay

## 2015-05-05 DIAGNOSIS — K5732 Diverticulitis of large intestine without perforation or abscess without bleeding: Secondary | ICD-10-CM

## 2015-05-05 DIAGNOSIS — R195 Other fecal abnormalities: Secondary | ICD-10-CM

## 2015-05-10 ENCOUNTER — Telehealth: Payer: Self-pay | Admitting: Gastroenterology

## 2015-05-10 NOTE — Telephone Encounter (Signed)
The note is to update the patient's chart from recent visit. Records obtained from [facility] hospital to clarify his history. He had a complicated sigmoid diverticulitis in 2014 associated with perforation / abscess leading to ex-lap and drainage of abscess. Diverticulosis appears isolated to sigmoid colon based on review of CT Scan. Will await colonoscopy report, records to be scanned into Epic.

## 2015-05-23 ENCOUNTER — Other Ambulatory Visit: Payer: Self-pay

## 2015-05-23 ENCOUNTER — Telehealth: Payer: Self-pay | Admitting: Gastroenterology

## 2015-05-23 MED ORDER — NA SULFATE-K SULFATE-MG SULF 17.5-3.13-1.6 GM/177ML PO SOLN: ORAL | Status: DC

## 2015-05-23 NOTE — Telephone Encounter (Signed)
Send prep to pharmacy. Called pt and informed him.

## 2015-05-25 ENCOUNTER — Telehealth: Payer: Self-pay | Admitting: Gastroenterology

## 2015-05-25 NOTE — Telephone Encounter (Signed)
Called pt and told him he can pick up prep from the front. Pt understands.

## 2015-05-26 ENCOUNTER — Encounter: Payer: Self-pay | Admitting: Gastroenterology

## 2015-05-26 ENCOUNTER — Ambulatory Visit (AMBULATORY_SURGERY_CENTER): Payer: 59 | Admitting: Gastroenterology

## 2015-05-26 VITALS — BP 126/78 | HR 64 | Temp 97.9°F | Resp 17 | Ht 71.0 in | Wt 248.0 lb

## 2015-05-26 DIAGNOSIS — D120 Benign neoplasm of cecum: Secondary | ICD-10-CM

## 2015-05-26 DIAGNOSIS — R195 Other fecal abnormalities: Secondary | ICD-10-CM

## 2015-05-26 DIAGNOSIS — K5732 Diverticulitis of large intestine without perforation or abscess without bleeding: Secondary | ICD-10-CM

## 2015-05-26 DIAGNOSIS — D123 Benign neoplasm of transverse colon: Secondary | ICD-10-CM | POA: Diagnosis not present

## 2015-05-26 DIAGNOSIS — D124 Benign neoplasm of descending colon: Secondary | ICD-10-CM

## 2015-05-26 DIAGNOSIS — R933 Abnormal findings on diagnostic imaging of other parts of digestive tract: Secondary | ICD-10-CM | POA: Diagnosis not present

## 2015-05-26 DIAGNOSIS — K635 Polyp of colon: Secondary | ICD-10-CM | POA: Diagnosis not present

## 2015-05-26 MED ORDER — SODIUM CHLORIDE 0.9 % IV SOLN: 500.0000 mL | INTRAVENOUS | Status: DC

## 2015-05-26 NOTE — Patient Instructions (Signed)
Impressions/recommendations:  Polyps (handout given) Diverticulosis (handout given) Hemorrhoids (handout given)  High Fiber diet (handout given)  No aspirin, aspirin containing products and NSAIDS for 2 weeks. May resume 06/10/15.  YOU HAD AN ENDOSCOPIC PROCEDURE TODAY AT [facility]:   Refer to the procedure report that was given to you for any specific questions about what was found during the examination.  If the procedure report does not answer your questions, please call your gastroenterologist to clarify.  If you requested that your care partner not be given the details of your procedure findings, then the procedure report has been included in a sealed envelope for you to review at your convenience later.  YOU SHOULD EXPECT: Some feelings of bloating in the abdomen. Passage of more gas than usual.  Walking can help get rid of the air that was put into your GI tract during the procedure and reduce the bloating. If you had a lower endoscopy (such as a colonoscopy or flexible sigmoidoscopy) you may notice spotting of blood in your stool or on the toilet paper. If you underwent a bowel prep for your procedure, you may not have a normal bowel movement for a few days.  Please Note:  You might notice some irritation and congestion in your nose or some drainage.  This is from the oxygen used during your procedure.  There is no need for concern and it should clear up in a day or so.  SYMPTOMS TO REPORT IMMEDIATELY:   Following lower endoscopy (colonoscopy or flexible sigmoidoscopy):  Excessive amounts of blood in the stool  Significant tenderness or worsening of abdominal pains  Swelling of the abdomen that is new, acute  Fever of 100F or higher  For urgent or emergent issues, a gastroenterologist can be reached at any hour by calling [phone].   DIET: Your first meal following the procedure should be a small meal and then it is ok to progress to your normal diet.  Heavy or fried foods are harder to digest and may make you feel nauseous or bloated.  Likewise, meals heavy in dairy and vegetables can increase bloating.  Drink plenty of fluids but you should avoid alcoholic beverages for 24 hours.  ACTIVITY:  You should plan to take it easy for the rest of today and you should NOT DRIVE or use heavy machinery until tomorrow (because of the sedation medicines used during the test).    FOLLOW UP: Our staff will call the number listed on your records the next business day following your procedure to check on you and address any questions or concerns that you may have regarding the information given to you following your procedure. If we do not reach you, we will leave a message.  However, if you are feeling well and you are not experiencing any problems, there is no need to return our call.  We will assume that you have returned to your regular daily activities without incident.  If any biopsies were taken you will be contacted by phone or by letter within the next 1-3 weeks.  Please call us at [phone] if you have not heard about the biopsies in 3 weeks.    SIGNATURES/CONFIDENTIALITY: You and/or your care partner have signed paperwork which will be entered into your electronic medical record.  These signatures attest to the fact that that the information above on your After Visit Summary has been reviewed and is understood.  Full responsibility of the confidentiality of this discharge information  lies with you and/or your care-partner.

## 2015-05-26 NOTE — Progress Notes (Signed)
VSS report to PACU RN

## 2015-05-26 NOTE — Progress Notes (Signed)
Called to room to assist during endoscopic procedure.  Patient ID and intended procedure confirmed with present staff. Received instructions for my participation in the procedure from the performing physician.  

## 2015-05-26 NOTE — Op Note (Signed)
[place]  [address] [place], [id number]   COLONOSCOPY PROCEDURE REPORT  PATIENT: Aaron Krause, Aaron Krause  MR#: [id number] BIRTHDATE: July 08, 1966 , 49  yrs. old GENDER: male ENDOSCOPIST: Yetta Flock, MD REFERRED BY: PROCEDURE DATE:  05/26/2015 PROCEDURE:   Colonoscopy, diagnostic, Colonoscopy with biopsy, Colonoscopy with snare polypectomy, and Submucosal injection, any substance Polyps removed today? Yes ASA CLASS:   Class II INDICATIONS:Colorectal Neoplasm Risk Assessment for this procedure is average risk, and an abnormal CT with history of suspected diverticulitis, also with occult positive stools. MEDICATIONS: Propofol 500 mg IV  DESCRIPTION OF PROCEDURE:   After the risks benefits and alternatives of the procedure were thoroughly explained, informed consent was obtained.  The digital rectal exam revealed no abnormalities of the rectum.   The LB PCF Q180 [id number]  endoscope was introduced through the anus and advanced to the cecum, which was identified by both the appendix and ileocecal valve. No adverse events experienced.   The quality of the prep was good.  The instrument was then slowly withdrawn as the colon was fully examined. Estimated blood loss is zero unless otherwise noted in this procedure report.   COLON FINDINGS: A 59mm sessile polyp was noted in the cecum and removed with cold forceps.  A 84mm sessile polyp was noted in the transverse colon and removed via cold snare.  A 70mm sessile polyp was noted in the transverse colon and removed with cold forceps. An inflammed, ulcerated sessile polyp, roughly 6mm in size was noted in the descending colon.  It was removed with a hot snare however some oozing was noted at the site following polypectomy. The polyp appeared atypical and had a deep base to it.  A hemostasis clip was placed to close the polypectomy site with good hemostasis.  The area was tattood in case follow up at the site  is warranted pending pathology results.  Moderate diverticulosis was otherwise noted in the left colon.  Retroflexed views revealed internal hemorrhoids. The time to cecum = 4.2 Withdrawal time = 36.9   The scope was withdrawn and the procedure completed. COMPLICATIONS: There were no immediate complications.  ENDOSCOPIC IMPRESSION: Multiple polyps removed as above. Descending colon polypectomy site was closed with hemostasis clip and tattooed as described above. Left sided diverticulosis Internal hemorrhoids  RECOMMENDATIONS: No aspirin or NSAIDs for 2 weeks post polypectomy Resume diet Resume medications Await pathology results  eSigned:  Yetta Flock, MD 05/26/2015 1:03 PM   cc: the patient   PATIENT NAME:  Aaron Krause MR#: [id number]

## 2015-05-27 ENCOUNTER — Telehealth: Payer: Self-pay

## 2015-05-27 NOTE — Telephone Encounter (Signed)
Left message on answering machine. 

## 2015-07-11 ENCOUNTER — Ambulatory Visit: Payer: 59 | Attending: Student | Admitting: Physical Therapy

## 2015-07-11 DIAGNOSIS — M25651 Stiffness of right hip, not elsewhere classified: Secondary | ICD-10-CM | POA: Diagnosis present

## 2015-07-11 DIAGNOSIS — R269 Unspecified abnormalities of gait and mobility: Secondary | ICD-10-CM | POA: Insufficient documentation

## 2015-07-11 DIAGNOSIS — M25551 Pain in right hip: Secondary | ICD-10-CM | POA: Insufficient documentation

## 2015-07-11 DIAGNOSIS — R5381 Other malaise: Secondary | ICD-10-CM

## 2015-07-11 NOTE — Therapy (Signed)
[facility] [place], [place], [address] Phone: [phone]   Fax:  [phone]  Physical Therapy Evaluation  Patient Details  Name: Aaron Krause MRN: [id number] Date of Birth: [birth year] Referring Provider: Vela Prose MD  Encounter Date: 07/11/2015      PT End of Session - 07/11/15 0942    Visit Number 1   Number of Visits 18   Date for PT Re-Evaluation 09/05/15   PT Start Time 0911   PT Stop Time 0940   PT Time Calculation (min) 29 min   Activity Tolerance Patient tolerated treatment well   Behavior During Therapy [facility] for tasks assessed/performed      Past Medical History  Diagnosis Date  . Ruptured disk   . Arthritis   . Diverticulitis     with perforation reported    Past Surgical History  Procedure Laterality Date  . Abdominal surgery      diverticulitis perforation    There were no vitals filed for this visit.  Visit Diagnosis:  Right hip pain - Plan: PT plan of care cert/re-cert  Hip stiffness, right - Plan: PT plan of care cert/re-cert  Gait abnormality - Plan: PT plan of care cert/re-cert  Debility - Plan: PT plan of care cert/re-cert      Subjective Assessment - 07/11/15 0915    Subjective Pain worsening since 2014.     Patient Stated Goals Get out of pain.   Currently in Pain? Yes   Pain Score 8    Pain Orientation Right   Pain Descriptors / Indicators Aching;Throbbing;Stabbing;Sharp;Shooting   Pain Onset More than a month ago   Pain Frequency Constant   Aggravating Factors  Working the hip.   Pain Relieving Factors Lying down.            [facility] PT Assessment - 07/11/15 0001    Assessment   Medical Diagnosis OA of right hip.   Referring Provider Vela Prose MD   Onset Date/Surgical Date --  2014.   Precautions   Precautions --  No ultrasound after surgery.   Restrictions   Weight Bearing Restrictions --  Per surgeon guidelies.   Balance Screen   Has the  patient fallen in the past 6 months Yes   How many times? 4-5   Has the patient had a decrease in activity level because of a fear of falling?  Yes   Is the patient reluctant to leave their home because of a fear of falling?  No   Home Environment   Living Environment Private residence   Prior Function   Level of Independence Independent   Observation/Other Assessments   Observations --  Equal leg lengths.   Posture/Postural Control   Posture Comments Right LE held in external rotation.   ROM / Strength   AROM / PROM / Strength AROM;Strength   AROM   Overall AROM Comments Right hip flexion to 70 degrees.  ER -45 degrees from neutral.   Strength   Overall Strength Comments Right hip flexion and abduction= 3-/5.   Palpation   Palpation comment Pain "in right hip.  C/o right LE pain to right foot on occasions due to lumbar issues.   Ambulation/Gait   Gait Comments Very antagic gait pattern with patient walking with a right stiff-legged gait pattern with right LE in excessive ER.  PT Long Term Goals - 07/11/15 0944    PT LONG TERM GOAL #1   Title Ind with a HEP.   Time 8   Period Weeks   Status New   PT LONG TERM GOAL #2   Title Right hip srength= 5/5.   Time 8   Period Weeks   Status New   PT LONG TERM GOAL #3   Title Walkj with normal gait pattern.   Time 8   Period Weeks   Status New   PT LONG TERM GOAL #4   Title Perform ADL's with pain not > 3/10.   Time 8   Period Weeks   Status New   PT LONG TERM GOAL #5   Title Restore normal hip motion when OK with surgeon.   Time 8   Period Weeks   Status New               Plan - 07/11/15 [id number]    Clinical Impression Statement Pain started many years ago due to ruptured discs.  Pain into right LE and affected gait cycle and wore my right hip out.  Pain in right hip is a 7-8/10.  Patient scheduled for right hip surgery later this month.   Pt will benefit from  skilled therapeutic intervention in order to improve on the following deficits Decreased activity tolerance;Decreased strength;Decreased range of motion;Difficulty walking   Rehab Potential Good   PT Frequency 3x / week   PT Duration 6 weeks   PT Treatment/Interventions ADLs/Self Care Home Management;Electrical Stimulation;Moist Heat;Therapeutic activities;Therapeutic exercise;Manual techniques;Patient/family education   PT Next Visit Plan Re-eval post op.  Plan to follow total hip protocol.         Problem List Patient Active Problem List   Diagnosis Date Noted  . Diverticulitis of colon 05/03/2015    APPLEGATE, [place] MPT 07/11/2015, 9:48 AM  [facility] [address] [place], [place], [address] Phone: [phone]   Fax:  [phone]  Name: Aaron Krause MRN: [id number] Date of Birth: 1965/11/30

## 2015-08-03 ENCOUNTER — Ambulatory Visit: Payer: BLUE CROSS/BLUE SHIELD | Attending: Student | Admitting: Physical Therapy

## 2015-08-03 DIAGNOSIS — R269 Unspecified abnormalities of gait and mobility: Secondary | ICD-10-CM | POA: Diagnosis present

## 2015-08-03 DIAGNOSIS — M25551 Pain in right hip: Secondary | ICD-10-CM | POA: Insufficient documentation

## 2015-08-03 DIAGNOSIS — R5381 Other malaise: Secondary | ICD-10-CM

## 2015-08-03 DIAGNOSIS — M25651 Stiffness of right hip, not elsewhere classified: Secondary | ICD-10-CM | POA: Diagnosis present

## 2015-08-03 NOTE — Therapy (Signed)
[facility] [place], [place], [address] Phone: [phone]   Fax:  [phone]  Physical Therapy Treatment  Patient Details  Name: Keilan Badders MRN: [id number] Date of Birth: [birth year] Referring Provider: Vela Prose MD  Encounter Date: 08/03/2015      PT End of Session - 08/03/15 1111    Visit Number 2   Number of Visits 18   Date for PT Re-Evaluation 09/05/15   PT Start Time 0955   PT Stop Time 1037   PT Time Calculation (min) 42 min   Activity Tolerance Patient tolerated treatment well   Behavior During Therapy [facility] for tasks assessed/performed      Past Medical History  Diagnosis Date  . Ruptured disk   . Arthritis   . Diverticulitis     with perforation reported    Past Surgical History  Procedure Laterality Date  . Abdominal surgery      diverticulitis perforation    There were no vitals filed for this visit.  Visit Diagnosis:  Right hip pain  Hip stiffness, right  Gait abnormality  Debility          [facility] PT Assessment - 08/03/15 1108    Strength   Overall Strength Comments Right hip flexion and abduction= 3-/5.   Palpation   Palpation comment Palpably tender around right hip post-surgical dressing.   Ambulation/Gait   Gait Comments Safe gait with FWW.                     [facility] PT Treatment/Exercise - 08/03/15 1108    Exercises   Exercises Knee/Hip   Knee/Hip Exercises: Aerobic   Nustep Level 3 to 70 degrees of right hip flexion x 15 minutes.                PT Education - 08/03/15 1109    Education provided Yes   Education Details Patient has been performing QS and assisted SLR's and hip abduction at home.  Added SAQ's with resistance today.Marland KitchenMarland KitchenMarland KitchenPatient did great with this exercise.             PT Long Term Goals - 07/11/15 0944    PT LONG TERM GOAL #1   Title Ind with a HEP.   Time 8   Period Weeks   Status New   PT LONG TERM GOAL #2    Title Right hip srength= 5/5.   Time 8   Period Weeks   Status New   PT LONG TERM GOAL #3   Title Walkj with normal gait pattern.   Time 8   Period Weeks   Status New   PT LONG TERM GOAL #4   Title Perform ADL's with pain not > 3/10.   Time 8   Period Weeks   Status New   PT LONG TERM GOAL #5   Title Restore normal hip motion when OK with surgeon.   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  He is well versed in his precautions.  He has his post-surgical dressing intact.  He returns to his surgeon next week (Tuesday 08/08/14) for suture removal.)  He is well pleased with his outcome thus far and is no longer experiencing the stabbing pain he was once.  Patient is  currently using a FWW and is wbat.   Pt will benefit from skilled therapeutic intervention in order to improve on the following deficits Decreased activity tolerance;Decreased strength;Decreased range of motion;Difficulty walking   PT Frequency 3x / week   PT Duration 6 weeks   PT Treatment/Interventions ADLs/Self Care Home Management;Electrical Stimulation;Moist Heat;Therapeutic activities;Therapeutic exercise;Manual techniques;Patient/family education   PT Next Visit Plan Re-eval post op.  Plan to follow total hip protocol.  Nustep; right LE strengthening; Gait taining and weaning from walker.        Problem List Patient Active Problem List   Diagnosis Date Noted  . Diverticulitis of colon 05/03/2015    Darcel Zick, [place] MPT 08/03/2015, 11:31 AM  [facility] [address] [place], [place], [address] Phone: [phone]   Fax:  [phone]  Name: Billie Luebbers MRN: [id number] Date of Birth: March 11, 1966

## 2015-08-10 ENCOUNTER — Encounter: Payer: Self-pay | Admitting: Physical Therapy

## 2015-08-10 ENCOUNTER — Ambulatory Visit: Payer: BLUE CROSS/BLUE SHIELD | Admitting: Physical Therapy

## 2015-08-10 DIAGNOSIS — M25551 Pain in right hip: Secondary | ICD-10-CM

## 2015-08-10 DIAGNOSIS — R5381 Other malaise: Secondary | ICD-10-CM

## 2015-08-10 DIAGNOSIS — M25651 Stiffness of right hip, not elsewhere classified: Secondary | ICD-10-CM

## 2015-08-10 DIAGNOSIS — R269 Unspecified abnormalities of gait and mobility: Secondary | ICD-10-CM

## 2015-08-10 NOTE — Therapy (Signed)
[facility] [place], [place], [address] Phone: [phone]   Fax:  [phone]  Physical Therapy Treatment  Patient Details  Name: Aaron Krause MRN: [id number] Date of Birth: 02/27/1966 Referring Provider: Vela Prose MD  Encounter Date: 08/10/2015      PT End of Session - 08/10/15 1001    Visit Number 3   Number of Visits 18   Date for PT Re-Evaluation 09/05/15   PT Start Time 0954   PT Stop Time 1032   PT Time Calculation (min) 38 min   Activity Tolerance Patient tolerated treatment well   Behavior During Therapy [facility] for tasks assessed/performed      Past Medical History  Diagnosis Date  . Ruptured disk   . Arthritis   . Diverticulitis     with perforation reported    Past Surgical History  Procedure Laterality Date  . Abdominal surgery      diverticulitis perforation    There were no vitals filed for this visit.  Visit Diagnosis:  Right hip pain  Hip stiffness, right  Gait abnormality  Debility      Subjective Assessment - 08/10/15 0958    Subjective Reports now able to lift RLE and is noted of abiding by hip precautions.   Patient Stated Goals Get out of pain.            [facility] PT Assessment - 08/10/15 0001    Assessment   Medical Diagnosis OA of right hip.   Onset Date/Surgical Date 07/27/15   Next MD Visit 09/06/2015   Precautions   Precaution Comments Right total hip replacement precautions.                     [facility] Adult PT Treatment/Exercise - 08/10/15 0001    Knee/Hip Exercises: Aerobic   Nustep L3 (less than 90 deg) x16 min   Knee/Hip Exercises: Supine   Quad Sets Strengthening;Right;1 set;15 reps;Other (comment)   5sec hold   [employer] Strengthening;Right;1 set;15 reps;Other (comment)  5 sec hold   Heel Slides AROM;Right;1 set;15 reps   Hip Adduction Isometric Strengthening;Both;1 set;15 reps;Other (comment)  5 sec hold   Straight Leg Raises  Strengthening;Right;1 set;15 reps   Other Supine Knee/Hip Exercises R hip abduction x15 reps AROM   Other Supine Knee/Hip Exercises Glut sets 3-5 sec hold x15 reps   Knee/Hip Exercises: Sidelying   Hip ABduction AROM;Right;1 set;15 reps                     PT Long Term Goals - 07/11/15 0944    PT LONG TERM GOAL #1   Title Ind with a HEP.   Time 8   Period Weeks   Status New   PT LONG TERM GOAL #2   Title Right hip srength= 5/5.   Time 8   Period Weeks   Status New   PT LONG TERM GOAL #3   Title Walkj with normal gait pattern.   Time 8   Period Weeks   Status New   PT LONG TERM GOAL #4   Title Perform ADL's with pain not > 3/10.   Time 8   Period Weeks   Status New   PT LONG TERM GOAL #5   Title Restore normal hip motion when OK with surgeon.   Time 8   Period Weeks   Status New  Plan - 08/10/15 1001    Clinical Impression Statement Patient tolerated today's treatment well with only muscle fatigue and burn during today's exercise. Completed all exercises with proper form and little multimodal cueing for technique. Patient is well educated regarding lateral approach hip precautions and uses FWW for ambulation. Per patient report he has tried walking with a cane but feels he is not ready at this time and would like to postpone gait training until next week. Reports experiencing RLE buckling sensation during ambulation but no sharp pain wiht ambulation. Educated to continue HEP exercises and to continue icing as needed. Denied R hip pain following today's treatment only muscle fatigue following today's treatment.   Pt will benefit from skilled therapeutic intervention in order to improve on the following deficits Decreased activity tolerance;Decreased strength;Decreased range of motion;Difficulty walking   Rehab Potential Good   PT Frequency 3x / week   PT Duration 6 weeks   PT Treatment/Interventions ADLs/Self Care Home Management;Electrical  Stimulation;Moist Heat;Therapeutic activities;Therapeutic exercise;Manual techniques;Patient/family education   PT Next Visit Plan Continue per R THR protocol with lateral approach per MD and MPT POC. Initiate gait training with cane next treatment.   Consulted and Agree with Plan of Care Patient        Problem List Patient Active Problem List   Diagnosis Date Noted  . Diverticulitis of colon 05/03/2015    Wynelle Fanny, PTA 08/10/2015, 11:25 AM  [facility] [address] [place], [place], [address] Phone: [phone]   Fax:  [phone]  Name: Lazerrick Sparta MRN: [id number] Date of Birth: 1966-04-27

## 2015-08-15 ENCOUNTER — Ambulatory Visit: Payer: BLUE CROSS/BLUE SHIELD | Admitting: Physical Therapy

## 2015-08-15 DIAGNOSIS — M25551 Pain in right hip: Secondary | ICD-10-CM | POA: Diagnosis not present

## 2015-08-15 DIAGNOSIS — R269 Unspecified abnormalities of gait and mobility: Secondary | ICD-10-CM

## 2015-08-15 DIAGNOSIS — M25651 Stiffness of right hip, not elsewhere classified: Secondary | ICD-10-CM

## 2015-08-15 DIAGNOSIS — R5381 Other malaise: Secondary | ICD-10-CM

## 2015-08-15 NOTE — Therapy (Signed)
[facility] [place], [place], [address] Phone: [phone]   Fax:  [phone]  Physical Therapy Treatment  Patient Details  Name: Jerzy Obrochta MRN: [id number] Date of Birth: [birth year] Referring Provider: Vela Prose MD  Encounter Date: 08/15/2015      PT End of Session - 08/15/15 0956    Visit Number 4   Number of Visits 18   Date for PT Re-Evaluation 09/05/15   PT Start Time 0951   PT Stop Time 1035   PT Time Calculation (min) 44 min   Activity Tolerance Patient tolerated treatment well   Behavior During Therapy [facility] for tasks assessed/performed      Past Medical History  Diagnosis Date  . Ruptured disk   . Arthritis   . Diverticulitis     with perforation reported    Past Surgical History  Procedure Laterality Date  . Abdominal surgery      diverticulitis perforation    There were no vitals filed for this visit.  Visit Diagnosis:  Hip stiffness, right  Gait abnormality  Debility      Subjective Assessment - 08/15/15 0956    Subjective Patient reports no pain.   Currently in Pain? No/denies                         [facility] Adult PT Treatment/Exercise - 08/15/15 0001    Ambulation/Gait   Ambulation Distance (Feet) 300 Feet   Assistive device Small based quad cane   Gait Pattern Decreased hip/knee flexion - right;Lateral trunk lean to left   Ambulation Surface Level   Pre-Gait Activities weight shifting side to side, semi tandem stance B    Gait Comments Worked to normalize gait using mirror and exaggerated step/stride length.    Knee/Hip Exercises: Stretches   Hip Flexor Stretch 1 rep;30 seconds   Knee/Hip Exercises: Aerobic   Nustep L3 (less than 90 deg) x16 min   Knee/Hip Exercises: Standing   Hip Flexion Stengthening;Both;3 sets;10 reps   Hip Abduction Stengthening;Both;3 sets;10 reps   SLS RLE x 30 seconds, x 15 sec no UE support   Knee/Hip Exercises: Supine    Bridges Limitations Bridge x 20   Straight Leg Raises Strengthening;Right;1 set;15 reps                     PT Long Term Goals - 08/15/15 1217    PT LONG TERM GOAL #1   Title Ind with a HEP.   Time 8   Period Weeks   Status On-going   PT LONG TERM GOAL #2   Title Right hip srength= 5/5.   Time 8   Period Weeks   Status On-going   PT LONG TERM GOAL #3   Title Walk with normal gait pattern.   Time 8   Period Weeks   Status On-going   PT LONG TERM GOAL #4   Title Perform ADL's with pain not > 3/10.   Time 8   Period Weeks   Status On-going   PT LONG TERM GOAL #5   Title Restore normal hip motion when OK with surgeon.   Time 8   Period Weeks   Status On-going               Plan - 08/15/15 1220    Clinical Impression Statement Patient did very well with gait training activities today. He was able to self correct trunk lean with mirror as visual  cue. He continues to have decreased hip extension on the R with gait, but tolerated HF stretch without complaint. Goals are ongoing.    Pt will benefit from skilled therapeutic intervention in order to improve on the following deficits Decreased activity tolerance;Decreased strength;Decreased range of motion;Difficulty walking   Rehab Potential Good   PT Frequency 3x / week   PT Duration 6 weeks   PT Treatment/Interventions ADLs/Self Care Home Management;Electrical Stimulation;Moist Heat;Therapeutic activities;Therapeutic exercise;Manual techniques;Patient/family education   PT Next Visit Plan Continue gait training with cane progressing to no AD. Also work SLS balance activities. Continue per R THR protocol with lateral approach per MD and MPT POC.  Note: pt would like to decrease frequency due to high copay.   Consulted and Agree with Plan of Care Patient        Problem List Patient Active Problem List   Diagnosis Date Noted  . Diverticulitis of colon 05/03/2015   Madelyn Flavors PT  08/15/2015, 12:25  PM  [facility] [address] [place], [place], [address] Phone: [phone]   Fax:  [phone]  Name: Gavan Lougheed MRN: [id number] Date of Birth: 12-02-1965

## 2015-08-22 ENCOUNTER — Ambulatory Visit: Payer: BLUE CROSS/BLUE SHIELD | Admitting: Physical Therapy

## 2015-08-22 ENCOUNTER — Encounter: Payer: Self-pay | Admitting: Physical Therapy

## 2015-08-22 DIAGNOSIS — M25551 Pain in right hip: Secondary | ICD-10-CM

## 2015-08-22 DIAGNOSIS — R269 Unspecified abnormalities of gait and mobility: Secondary | ICD-10-CM

## 2015-08-22 DIAGNOSIS — R5381 Other malaise: Secondary | ICD-10-CM

## 2015-08-22 DIAGNOSIS — M25651 Stiffness of right hip, not elsewhere classified: Secondary | ICD-10-CM

## 2015-08-22 NOTE — Therapy (Signed)
[facility] [place], [place], [address] Phone: [phone]   Fax:  [phone]  Physical Therapy Treatment  Patient Details  Name: Aaron Krause MRN: [id number] Date of Birth: [birth year] Referring Provider: Vela Prose MD  Encounter Date: 08/22/2015      PT End of Session - 08/22/15 0847    Visit Number 5   Number of Visits 18   Date for PT Re-Evaluation 09/05/15   PT Start Time 0828   PT Stop Time 0856   PT Time Calculation (min) 28 min   Activity Tolerance Patient tolerated treatment well   Behavior During Therapy [facility] for tasks assessed/performed      Past Medical History  Diagnosis Date  . Ruptured disk   . Arthritis   . Diverticulitis     with perforation reported    Past Surgical History  Procedure Laterality Date  . Abdominal surgery      diverticulitis perforation    There were no vitals filed for this visit.  Visit Diagnosis:  Hip stiffness, right  Gait abnormality  Debility  Right hip pain      Subjective Assessment - 08/22/15 0831    Subjective Patient has no pain reports today. Patient reported working out on elliptical on saterday for a quarter mile and had some soreness after / patient 44min late today   Patient Stated Goals Get out of pain.   Currently in Pain? No/denies   Aggravating Factors  muscle weakness causing soreness with overactivity per patient   Pain Relieving Factors rest                         [facility] Adult PT Treatment/Exercise - 08/22/15 0001    Knee/Hip Exercises: Stretches   Hip Flexor Stretch 2 reps;20 seconds   Knee/Hip Exercises: Aerobic   Nustep 46min L4 within precations   Knee/Hip Exercises: Standing   Hip Flexion Stengthening;Both;10 reps;2 sets   Hip Abduction Stengthening;Both;10 reps;2 sets   Lateral Step Up Right;2 sets;10 reps;Step Height: 6"   Forward Step Up Right;2 sets;10 reps;Step Height: 6"   Rocker Board 3 minutes   SLS RLE x 30 seconds, x 15 sec no UE support   Knee/Hip Exercises: Supine   Bridges Limitations Bridge x 20   Straight Leg Raises Right;Strengthening;2 sets;10 reps                     PT Long Term Goals - 08/15/15 1217    PT LONG TERM GOAL #1   Title Ind with a HEP.   Time 8   Period Weeks   Status On-going   PT LONG TERM GOAL #2   Title Right hip srength= 5/5.   Time 8   Period Weeks   Status On-going   PT LONG TERM GOAL #3   Title Walk with normal gait pattern.   Time 8   Period Weeks   Status On-going   PT LONG TERM GOAL #4   Title Perform ADL's with pain not > 3/10.   Time 8   Period Weeks   Status On-going   PT LONG TERM GOAL #5   Title Restore normal hip motion when OK with surgeon.   Time 8   Period Weeks   Status On-going               Plan - 08/22/15 [id number]    Clinical Impression Statement Pateint tolerated treatment very well today with no  pain reported. Patient reported doing HEP daily with no difficulty. Patient unable to meet any further goals due to weakness in right LE. slight antalgic gait pattern due to weakness.   Pt will benefit from skilled therapeutic intervention in order to improve on the following deficits Decreased activity tolerance;Decreased strength;Decreased range of motion;Difficulty walking   Rehab Potential Good   PT Frequency 3x / week   PT Duration 6 weeks   PT Treatment/Interventions ADLs/Self Care Home Management;Electrical Stimulation;Moist Heat;Therapeutic activities;Therapeutic exercise;Manual techniques;Patient/family education   PT Next Visit Plan Continue gait training with cane progressing to no AD. Also work SLS balance activities. Continue per R THR protocol with lateral approach per MD and MPT POC.  Note: pt would like to decrease frequency due to high copay.   Consulted and Agree with Plan of Care Patient        Problem List Patient Active Problem List   Diagnosis Date Noted  . Diverticulitis of  colon 05/03/2015    Phillips Climes, PTA 08/22/2015, 8:57 AM  [facility] [place], [place], [address] Phone: [phone]   Fax:  [phone]  Name: Aaron Krause MRN: [id number] Date of Birth: [birth year]

## 2015-08-29 ENCOUNTER — Ambulatory Visit: Payer: BLUE CROSS/BLUE SHIELD | Admitting: Physical Therapy

## 2015-08-29 ENCOUNTER — Encounter: Payer: Self-pay | Admitting: Physical Therapy

## 2015-08-29 DIAGNOSIS — R5381 Other malaise: Secondary | ICD-10-CM

## 2015-08-29 DIAGNOSIS — M25651 Stiffness of right hip, not elsewhere classified: Secondary | ICD-10-CM

## 2015-08-29 DIAGNOSIS — M25551 Pain in right hip: Secondary | ICD-10-CM | POA: Diagnosis not present

## 2015-08-29 DIAGNOSIS — R269 Unspecified abnormalities of gait and mobility: Secondary | ICD-10-CM

## 2015-08-29 NOTE — Therapy (Signed)
[facility] [address] [place], [place], [address] Phone: [phone]   Fax:  [phone]  Physical Therapy Treatment  Patient Details  Name: Aaron Krause MRN: [id number] Date of Birth: 08/25/1965 Referring Provider: Maxwell Langfitt MD  Encounter Date: 08/29/2015      PT End of Session - 08/29/15 0857    Visit Number 6   Number of Visits 18   Date for PT Re-Evaluation 09/05/15   PT Start Time 0814   PT Stop Time 0858   PT Time Calculation (min) 44 min   Activity Tolerance Patient tolerated treatment well   Behavior During Therapy WFL for tasks assessed/performed      Past Medical History  Diagnosis Date  . Ruptured disk   . Arthritis   . Diverticulitis     with perforation reported    Past Surgical History  Procedure Laterality Date  . Abdominal surgery      diverticulitis perforation    There were no vitals filed for this visit.  Visit Diagnosis:  Hip stiffness, right  Gait abnormality  Debility  Right hip pain      Subjective Assessment - 08/29/15 0828    Subjective patient continues to go to work out at [employer] on elliptical with no pain today   Patient Stated Goals Get out of pain.   Currently in Pain? No/denies            [facility] PT Assessment - 08/29/15 0001    Strength   Overall Strength Deficits   Overall Strength Comments right hip abd 4+/5                     [facility] Adult PT Treatment/Exercise - 08/29/15 0001    Knee/Hip Exercises: Stretches   Hip Flexor Stretch 2 reps;20 seconds   Knee/Hip Exercises: Aerobic   Tread Mill 1.5 x8min   Nustep 15min    Knee/Hip Exercises: Standing   Hip Abduction Stengthening;Both;10 reps;2 sets   Lateral Step Up Right;10 reps;Step Height: 6";3 sets   Forward Step Up Right;10 reps;Step Height: 6";3 sets   SLS RLE x 30 seconds, x 15 sec no UE support   Knee/Hip Exercises: Supine   Bridges Limitations Bridge x 20   Straight Leg Raises  Right;Strengthening;2 sets;10 reps                PT Education - 08/29/15 0845    Education provided Yes   Education Details HEP   Person(s) Educated Patient   Methods Explanation;Demonstration;Handout   Comprehension Verbalized understanding;Returned demonstration             PT Long Term Goals - 08/29/15 0851    PT LONG TERM GOAL #1   Title Ind with a HEP.   Time 8   Period Weeks   Status Achieved  independent with HEP (08/29/15)   PT LONG TERM GOAL #2   Title Right hip srength= 5/5.   Time 8   Period Weeks   Status On-going  4+/5 (08/29/15)   PT LONG TERM GOAL #3   Title Walk with normal gait pattern.   Time 8   Period Weeks   Status On-going   PT LONG TERM GOAL #4   Title Perform ADL's with pain not > 3/10.   Time 8   Period Weeks   Status Achieved  0 pain with ADL's (08/29/15)   PT LONG TERM GOAL #5   Title Restore normal hip motion when OK with surgeon.     Time 8   Period Weeks   Status On-going               Plan - 08/29/15 0854    Clinical Impression Statement Patient continues to progress with all activities. Patient has 0 pain with ADL's and no pain complaints. Patient has improved right hip strength. Patient met LTG #1 and #4 others ongoing due to slight deviated gait and strength deficits.   Pt will benefit from skilled therapeutic intervention in order to improve on the following deficits Decreased activity tolerance;Decreased strength;Decreased range of motion;Difficulty walking   Rehab Potential Good   PT Frequency 3x / week   PT Duration 6 weeks   PT Treatment/Interventions ADLs/Self Care Home Management;Electrical Stimulation;Moist Heat;Therapeutic activities;Therapeutic exercise;Manual techniques;Patient/family education   PT Next Visit Plan Continue gait training with cane progressing to no AD. Also work SLS balance activities. Continue per R THR protocol with lateral approach per MD and MPT POC.  Note: pt would like to decrease  frequency due to high copay. cont per MD [name] 09/06/15    Consulted and Agree with Plan of Care Patient        Problem List Patient Active Problem List   Diagnosis Date Noted  . Diverticulitis of colon 05/03/2015    [name], PTA 08/29/2015, 8:59 AM  [name], PTA 08/29/2015 8:59 AM   [facility] [place], [place], [address] Phone: [phone]   Fax:  [phone]  Name: Aaron Krause MRN: [id number] Date of Birth: 26-Mar-1966

## 2015-08-29 NOTE — Patient Instructions (Signed)
  Abduction: Side Leg Lift (Eccentric) - Side-Lying   Lie on side. Lift top leg slightly higher than shoulder level. Keep top leg straight with body, toes pointing forward. Slowly lower for 3-5 seconds. _10__ reps per set, _2-3__ sets per day, _3__ days per week. Add _1__ lbs when you achieve _30__ repetitions.   Strengthening: Straight Leg Raise (Phase 1)   Tighten muscles on front of right thigh, then lift leg __5__ inches from surface, keeping knee locked.  Repeat _10___ times per set. Do __2-3__ sets per session. Do ___2_ sessions per day.    Bridging   Slowly raise buttocks from floor, keeping stomach tight. Repeat _10___ times per set. Do __2__ sets per session. Do __2__ sessions per day.

## 2015-08-29 NOTE — Therapy (Signed)
[facility] [address] [place], [place], [address] Phone: [phone]   Fax:  [phone]  Physical Therapy Treatment  Patient Details  Name: Aaron Krause MRN: [id number] Date of Birth: 10/23/1965 Referring Provider: Maxwell Langfitt MD  Encounter Date: 08/29/2015      PT End of Session - 08/29/15 0857    Visit Number 6   Number of Visits 18   Date for PT Re-Evaluation 09/05/15   PT Start Time 0814   PT Stop Time 0858   PT Time Calculation (min) 44 min   Activity Tolerance Patient tolerated treatment well   Behavior During Therapy WFL for tasks assessed/performed      Past Medical History  Diagnosis Date  . Ruptured disk   . Arthritis   . Diverticulitis     with perforation reported    Past Surgical History  Procedure Laterality Date  . Abdominal surgery      diverticulitis perforation    There were no vitals filed for this visit.  Visit Diagnosis:  Hip stiffness, right  Gait abnormality  Debility  Right hip pain      Subjective Assessment - 08/29/15 0828    Subjective patient continues to go to work out at [employer] on elliptical with no pain today   Patient Stated Goals Get out of pain.   Currently in Pain? No/denies            [facility] PT Assessment - 08/29/15 0001    Strength   Overall Strength Deficits   Overall Strength Comments right hip abd 4+/5                     [facility] Adult PT Treatment/Exercise - 08/29/15 0001    Knee/Hip Exercises: Stretches   Hip Flexor Stretch 2 reps;20 seconds   Knee/Hip Exercises: Aerobic   Tread Mill 1.5 x8min   Nustep 15min    Knee/Hip Exercises: Standing   Hip Abduction Stengthening;Both;10 reps;2 sets   Lateral Step Up Right;10 reps;Step Height: 6";3 sets   Forward Step Up Right;10 reps;Step Height: 6";3 sets   SLS RLE x 30 seconds, x 15 sec no UE support   Knee/Hip Exercises: Supine   Bridges Limitations Bridge x 20   Straight Leg Raises  Right;Strengthening;2 sets;10 reps                PT Education - 08/29/15 0845    Education provided Yes   Education Details HEP   Person(s) Educated Patient   Methods Explanation;Demonstration;Handout   Comprehension Verbalized understanding;Returned demonstration             PT Long Term Goals - 08/29/15 0851    PT LONG TERM GOAL #1   Title Ind with a HEP.   Time 8   Period Weeks   Status Achieved  independent with HEP (08/29/15)   PT LONG TERM GOAL #2   Title Right hip srength= 5/5.   Time 8   Period Weeks   Status On-going  4+/5 (08/29/15)   PT LONG TERM GOAL #3   Title Walk with normal gait pattern.   Time 8   Period Weeks   Status On-going   PT LONG TERM GOAL #4   Title Perform ADL's with pain not > 3/10.   Time 8   Period Weeks   Status Achieved  0 pain with ADL's (08/29/15)   PT LONG TERM GOAL #5   Title Restore normal hip motion when OK with surgeon.     Time 8   Period Weeks   Status On-going               Plan - 08/29/15 0854    Clinical Impression Statement Patient continues to progress with all activities. Patient has 0 pain with ADL's and no pain complaints. Patient has improved right hip strength. Patient met LTG #1 and #4 others ongoing due to slight deviated gait and strength deficits.   Pt will benefit from skilled therapeutic intervention in order to improve on the following deficits Decreased activity tolerance;Decreased strength;Decreased range of motion;Difficulty walking   Rehab Potential Good   PT Frequency 3x / week   PT Duration 6 weeks   PT Treatment/Interventions ADLs/Self Care Home Management;Electrical Stimulation;Moist Heat;Therapeutic activities;Therapeutic exercise;Manual techniques;Patient/family education   PT Next Visit Plan Continue gait training with cane progressing to no AD. Also work SLS balance activities. Continue per R THR protocol with lateral approach per MD and MPT POC.  Note: pt would like to decrease  frequency due to high copay. cont per MD [name] 09/06/15    Consulted and Agree with Plan of Care Patient        Problem List Patient Active Problem List   Diagnosis Date Noted  . Diverticulitis of colon 05/03/2015    Raylei Losurdo, [place] MPT 08/29/2015, 9:45 AM  [facility] [address] [place], [place], [address] Phone: [phone]   Fax:  [phone]  Name: Aaron Krause MRN: [id number] Date of Birth: 04/10/1966

## 2015-09-07 ENCOUNTER — Encounter: Payer: BLUE CROSS/BLUE SHIELD | Admitting: Physical Therapy

## 2015-09-08 ENCOUNTER — Ambulatory Visit: Payer: BLUE CROSS/BLUE SHIELD | Attending: Student | Admitting: Physical Therapy

## 2015-09-08 DIAGNOSIS — R5381 Other malaise: Secondary | ICD-10-CM | POA: Diagnosis present

## 2015-09-08 DIAGNOSIS — M25651 Stiffness of right hip, not elsewhere classified: Secondary | ICD-10-CM | POA: Insufficient documentation

## 2015-09-08 DIAGNOSIS — M25551 Pain in right hip: Secondary | ICD-10-CM | POA: Diagnosis present

## 2015-09-08 DIAGNOSIS — R269 Unspecified abnormalities of gait and mobility: Secondary | ICD-10-CM | POA: Insufficient documentation

## 2015-09-08 NOTE — Therapy (Signed)
[facility] [place], [place], [address] Phone: [phone]   Fax:  [phone]  Physical Therapy Treatment  Patient Details  Name: Aaron Krause MRN: [id number] Date of Birth: December 30, 1965 Referring Provider: Vela Prose MD  Encounter Date: 09/08/2015      PT End of Session - 09/08/15 1816    Visit Number 7   Number of Visits 18   Date for PT Re-Evaluation 09/05/15   PT Start Time 0454   PT Stop Time 0557   PT Time Calculation (min) 63 min   Activity Tolerance Patient tolerated treatment well   Behavior During Therapy [facility] for tasks assessed/performed      Past Medical History  Diagnosis Date  . Ruptured disk   . Arthritis   . Diverticulitis     with perforation reported    Past Surgical History  Procedure Laterality Date  . Abdominal surgery      diverticulitis perforation    There were no vitals filed for this visit.  Visit Diagnosis:  Hip stiffness, right  Gait abnormality  Debility  Right hip pain      Subjective Assessment - 09/08/15 1758    Subjective I'm back to work and doing great.   Patient Stated Goals Get out of pain.   Pain Score 4    Pain Orientation Right   Pain Descriptors / Indicators Aching                         [facility] Adult PT Treatment/Exercise - 09/08/15 0001    Exercises   Exercises Knee/Hip   Knee/Hip Exercises: Aerobic   Tread Mill x 18 minutes with patient progressively increasing incline to his tolerance.   Nustep Level 7 x 15 minutes.   Knee/Hip Exercises: Standing   Lateral Step Up Limitations 6 inch x 4 minutes.   Forward Step Up Limitations 6 inch x 4 minutes.                     PT Long Term Goals - 08/29/15 [id number]    PT LONG TERM GOAL #1   Title Ind with a HEP.   Time 8   Period Weeks   Status Achieved  independent with HEP (08/29/15)   PT LONG TERM GOAL #2   Title Right hip srength= 5/5.   Time 8   Period Weeks    Status On-going  4+/5 (08/29/15)   PT LONG TERM GOAL #3   Title Walk with normal gait pattern.   Time 8   Period Weeks   Status On-going   PT LONG TERM GOAL #4   Title Perform ADL's with pain not > 3/10.   Time 8   Period Weeks   Status Achieved  0 pain with ADL's (08/29/15)   PT LONG TERM GOAL #5   Title Restore normal hip motion when OK with surgeon.   Time 8   Period Weeks   Status On-going               Problem List Patient Active Problem List   Diagnosis Date Noted  . Diverticulitis of colon 05/03/2015    Roxanna Mcever, [place] MPT 09/08/2015, 6:18 PM  [facility] [address] [place], [place], [address] Phone: [phone]   Fax:  [phone]  Name: Aaron Krause MRN: [id number] Date of Birth: 11-15-1965

## 2015-09-13 ENCOUNTER — Ambulatory Visit: Payer: BLUE CROSS/BLUE SHIELD | Admitting: Physical Therapy

## 2015-09-27 ENCOUNTER — Ambulatory Visit: Payer: BLUE CROSS/BLUE SHIELD | Admitting: Physical Therapy

## 2015-09-29 ENCOUNTER — Ambulatory Visit: Payer: BLUE CROSS/BLUE SHIELD | Attending: Student | Admitting: Physical Therapy

## 2015-09-29 ENCOUNTER — Encounter: Payer: Self-pay | Admitting: Physical Therapy

## 2015-09-29 DIAGNOSIS — M25651 Stiffness of right hip, not elsewhere classified: Secondary | ICD-10-CM | POA: Diagnosis present

## 2015-09-29 DIAGNOSIS — R269 Unspecified abnormalities of gait and mobility: Secondary | ICD-10-CM | POA: Insufficient documentation

## 2015-09-29 DIAGNOSIS — M25551 Pain in right hip: Secondary | ICD-10-CM | POA: Diagnosis present

## 2015-09-29 DIAGNOSIS — R5381 Other malaise: Secondary | ICD-10-CM | POA: Diagnosis present

## 2015-09-29 NOTE — Therapy (Signed)
[facility] [place], [place], [address] Phone: [phone]   Fax:  [phone]  Physical Therapy Treatment  Patient Details  Name: Aaron Krause MRN: [id number] Date of Birth: 10/10/1965 Referring Provider: Vela Prose MD  Encounter Date: 09/29/2015      PT End of Session - 09/29/15 1616    Visit Number 8   Number of Visits 18   Date for PT Re-Evaluation 09/05/15   PT Start Time [id number]   PT Stop Time 1646   PT Time Calculation (min) 30 min   Activity Tolerance Patient tolerated treatment well   Behavior During Therapy [facility] for tasks assessed/performed      Past Medical History  Diagnosis Date  . Ruptured disk   . Arthritis   . Diverticulitis     with perforation reported    Past Surgical History  Procedure Laterality Date  . Abdominal surgery      diverticulitis perforation    There were no vitals filed for this visit.  Visit Diagnosis:  Hip stiffness, right  Gait abnormality  Debility  Right hip pain      Subjective Assessment - 09/29/15 1616    Subjective Denies any R hip pain today.   Patient Stated Goals Get out of pain.   Currently in Pain? No/denies            [id number] PT Assessment - 09/29/15 0001    Assessment   Medical Diagnosis OA of right hip.   Onset Date/Surgical Date 07/27/15   Next MD Visit 10/2015   Precautions   Precaution Comments Right total hip replacement precautions.                     [facility] Adult PT Treatment/Exercise - 09/29/15 0001    Knee/Hip Exercises: Aerobic   Nustep R6, L5 x10 min   Knee/Hip Exercises: Machines for Strengthening   Cybex Leg Press  3 pl, seat 9, 3x10 reps   Knee/Hip Exercises: Standing   Lateral Step Up Right;3 sets;10 reps;Step Height: 6"   Forward Step Up Right;3 sets;10 reps;Hand Hold: 0;Step Height: 6"   Wall Squat 2 sets;10 reps   Walking with Sports Cord Pink XTS 4D x10 reps each   Other Standing Knee Exercises  Sidestepping red theraband 10' x4 RT                     PT Long Term Goals - 09/29/15 1631    PT LONG TERM GOAL #1   Title Ind with a HEP.   Time 8   Period Weeks   Status Achieved  independent with HEP (08/29/15)   PT LONG TERM GOAL #2   Title Right hip srength= 5/5.   Time 8   Period Weeks   Status On-going  4+/5 (08/29/15)   PT LONG TERM GOAL #3   Title Walk with normal gait pattern.   Time 8   Period Weeks   Status Achieved   PT LONG TERM GOAL #4   Title Perform ADL's with pain not > 3/10.   Time 8   Period Weeks   Status Achieved  0 pain with ADL's (08/29/15)   PT LONG TERM GOAL #5   Title Restore normal hip motion when OK with surgeon.   Time 8   Period Weeks   Status Deferred  Deferred per MPT until patient sees Surgeon in early April 2017  Plan - 09/29/15 1651    Clinical Impression Statement Patient continues to progress well with all exercises and actvities today with no reports of pain. Only ADL of difficulty is tying his R shoe per patient report. Tolerates elliptical and all strengthening exercises well without complaint of pain. Patient achieved gait LT goal today in clinic with no gait deviation noted. R hip ROM goal deferred per MPT until patient sees surgeon in April 2017. Patient denied pain in R hip following today's treatment.   Pt will benefit from skilled therapeutic intervention in order to improve on the following deficits Decreased activity tolerance;Decreased strength;Decreased range of motion;Difficulty walking   Rehab Potential Good   PT Frequency 3x / week   PT Duration 6 weeks   PT Treatment/Interventions ADLs/Self Care Home Management;Electrical Stimulation;Moist Heat;Therapeutic activities;Therapeutic exercise;Manual techniques;Patient/family education   PT Next Visit Plan Continue with RLE strengthening as well as proprioceptive exercises per MPT POC for THR lateral approach.   Consulted and Agree with Plan  of Care Patient        Problem List Patient Active Problem List   Diagnosis Date Noted  . Diverticulitis of colon 05/03/2015    Wynelle Fanny, PTA 09/29/2015, 4:56 PM  [facility] [address] [place], [place], [address] Phone: [phone]   Fax:  [phone]  Name: Aaron Krause MRN: [id number] Date of Birth: [birth year]

## 2015-10-06 ENCOUNTER — Ambulatory Visit: Payer: BLUE CROSS/BLUE SHIELD | Admitting: Physical Therapy

## 2015-10-06 DIAGNOSIS — M25651 Stiffness of right hip, not elsewhere classified: Secondary | ICD-10-CM | POA: Diagnosis not present

## 2015-10-06 DIAGNOSIS — R269 Unspecified abnormalities of gait and mobility: Secondary | ICD-10-CM

## 2015-10-06 DIAGNOSIS — M25551 Pain in right hip: Secondary | ICD-10-CM

## 2015-10-06 DIAGNOSIS — R5381 Other malaise: Secondary | ICD-10-CM

## 2015-10-06 NOTE — Therapy (Addendum)
[facility] [address] [place], [place], [address] Phone: [phone]   Fax:  [phone]  Physical Therapy Treatment  Patient Details  Name: Aaron Krause MRN: [id number] Date of Birth: 01-14-1966 Referring Provider: Levonne Lapping MD  Encounter Date: 10/06/2015      PT End of Session - 10/06/15 1811    Visit Number 9   Number of Visits 18   Date for PT Re-Evaluation 09/05/15   PT Start Time 0445   PT Stop Time 0537   PT Time Calculation (min) 52 min   Activity Tolerance Patient tolerated treatment well   Behavior During Therapy [facility] for tasks assessed/performed      Past Medical History  Diagnosis Date  . Ruptured disk   . Arthritis   . Diverticulitis     with perforation reported    Past Surgical History  Procedure Laterality Date  . Abdominal surgery      diverticulitis perforation    There were no vitals filed for this visit.  Visit Diagnosis:  Hip stiffness, right  Gait abnormality  Debility  Right hip pain      Subjective Assessment - 10/06/15 1802    Subjective I'm doing great!!   Currently in Pain? No/denies                                      PT Long Term Goals - 09/29/15 1631    PT LONG TERM GOAL #1   Title Ind with a HEP.   Time 8   Period Weeks   Status Achieved  independent with HEP (08/29/15)   PT LONG TERM GOAL #2   Title Right hip srength= 5/5.   Time 8   Period Weeks   Status On-going  4+/5 (08/29/15)   PT LONG TERM GOAL #3   Title Walk with normal gait pattern.   Time 8   Period Weeks   Status Achieved   PT LONG TERM GOAL #4   Title Perform ADL's with pain not > 3/10.   Time 8   Period Weeks   Status Achieved  0 pain with ADL's (08/29/15)   PT LONG TERM GOAL #5   Title Restore normal hip motion when OK with surgeon.   Time 8   Period Weeks   Status Deferred  Deferred per MPT until patient sees Surgeon in early April 2017                Problem List Patient Active Problem List   Diagnosis Date Noted  . Diverticulitis of colon 05/03/2015  TREATMENT:  Treadmill x 16 minutes with speed and incline controlled by patient f/b Elliptical incline 8 and workload 8 x 10 minutes (forward and backward) f/b Nustep x 15 minutes f/b SDLY hip abduction.   PHYSICAL THERAPY DISCHARGE SUMMARY  Visits from Start of Care: 9.  Current functional level related to goals / functional outcomes: See above.   Remaining deficits: Excellent progress with just a minimal loss of right hip strength.   Education / Equipment: HEP. Plan: Patient agrees to discharge.  Patient goals were partially met. Patient is being discharged due to being pleased with the current functional level.  ?????      [name], [name] 10/06/2015, 6:12 PM  [facility] [address] [place], [place], [address] Phone: [phone]   Fax:  [phone]  Name: Aaron Krause MRN: [id number]  Date of Birth: 06/16/1966

## 2015-10-13 ENCOUNTER — Ambulatory Visit: Payer: BLUE CROSS/BLUE SHIELD | Admitting: Physical Therapy

## 2015-10-18 ENCOUNTER — Ambulatory Visit: Payer: BLUE CROSS/BLUE SHIELD | Admitting: *Deleted

## 2016-04-10 ENCOUNTER — Ambulatory Visit (INDEPENDENT_AMBULATORY_CARE_PROVIDER_SITE_OTHER): Payer: BLUE CROSS/BLUE SHIELD

## 2016-04-10 ENCOUNTER — Ambulatory Visit (INDEPENDENT_AMBULATORY_CARE_PROVIDER_SITE_OTHER): Payer: BLUE CROSS/BLUE SHIELD | Admitting: Family

## 2016-04-10 ENCOUNTER — Encounter: Payer: Self-pay | Admitting: Family

## 2016-04-10 VITALS — BP 137/80 | HR 64 | Temp 98.1°F | Ht 71.5 in | Wt 271.6 lb

## 2016-04-10 DIAGNOSIS — M25572 Pain in left ankle and joints of left foot: Secondary | ICD-10-CM

## 2016-04-10 DIAGNOSIS — S9002XA Contusion of left ankle, initial encounter: Secondary | ICD-10-CM

## 2016-04-10 DIAGNOSIS — M79672 Pain in left foot: Secondary | ICD-10-CM

## 2016-04-10 NOTE — Patient Instructions (Signed)

## 2016-04-10 NOTE — Progress Notes (Signed)
   Subjective:    Patient ID: Aaron Krause, male    DOB: Feb 01, 1966, 50 y.o.   MRN: [id number]  Ankle Pain   The incident occurred 3 to 6 hours ago. The incident occurred at home. The injury mechanism was a direct blow. The quality of the pain is described as aching. The pain is at a severity of 2/10. The pain is mild. The pain has been constant since onset. Associated symptoms include tingling. Pertinent negatives include no inability to bear weight, loss of motion or numbness. He reports no foreign bodies present. The symptoms are aggravated by movement. He has tried nothing for the symptoms. The treatment provided no relief.      Review of Systems  Musculoskeletal: Positive for joint swelling.  Neurological: Positive for tingling. Negative for numbness.  All other systems reviewed and are negative.      Objective:   Physical Exam  Constitutional: He is oriented to person, place, and time. He appears well-developed and well-nourished. No distress.  HENT:  Head: Normocephalic.  Eyes: Pupils are equal, round, and reactive to light. Right eye exhibits no discharge. Left eye exhibits no discharge.  Neck: Normal range of motion. Neck supple. No thyromegaly present.  Cardiovascular: Normal rate, regular rhythm, normal heart sounds and intact distal pulses.   No murmur heard. Pulmonary/Chest: Effort normal and breath sounds normal. No respiratory distress. He has no wheezes.  Abdominal: Soft. Bowel sounds are normal. He exhibits no distension. There is no tenderness.  Musculoskeletal: Normal range of motion. He exhibits edema (moderate swelling of anterior ankle with eccyhmosis ) and tenderness.  Neurological: He is alert and oriented to person, place, and time.  Skin: Skin is warm and dry. No rash noted. No erythema.  Psychiatric: He has a normal mood and affect. His behavior is normal. Judgment and thought content normal.  Vitals reviewed.   X-ray- No fracture noted Preliminary  reading by Evelina Dun, FNP [facility]   BP 137/80 (BP Location: Right Arm, Patient Position: Sitting, Cuff Size: Large)   Pulse 64   Temp 98.1 F (36.7 C) (Oral)   Ht 5' 11.5" (1.816 m)   Wt 271 lb 9.6 oz (123.2 kg)   BMI 37.35 kg/m      Assessment & Plan:  1. Left foot pain - DG Foot Complete Left; Future  2. Left ankle pain - DG Ankle Complete Left; Future  3. Contusion, ankle, left, initial encounter -Rest -Ice  -Tylenol prn for pain   Evelina Dun, FNP

## 2017-05-20 ENCOUNTER — Emergency Department (HOSPITAL_COMMUNITY): Payer: Worker's Compensation

## 2017-05-20 ENCOUNTER — Emergency Department (HOSPITAL_COMMUNITY)
Admission: EM | Admit: 2017-05-20 | Discharge: 2017-05-21 | Disposition: A | Discharge status: Home / Self Care | Payer: Worker's Compensation | Attending: Emergency Medicine | Admitting: Emergency Medicine

## 2017-05-20 ENCOUNTER — Encounter (HOSPITAL_COMMUNITY): Payer: Self-pay | Admitting: Emergency Medicine

## 2017-05-20 DIAGNOSIS — W1789XA Other fall from one level to another, initial encounter: Secondary | ICD-10-CM | POA: Insufficient documentation

## 2017-05-20 DIAGNOSIS — Y990 Civilian activity done for income or pay: Secondary | ICD-10-CM | POA: Diagnosis not present

## 2017-05-20 DIAGNOSIS — Y9389 Activity, other specified: Secondary | ICD-10-CM | POA: Diagnosis not present

## 2017-05-20 DIAGNOSIS — Z87891 Personal history of nicotine dependence: Secondary | ICD-10-CM | POA: Diagnosis not present

## 2017-05-20 DIAGNOSIS — S9304XA Dislocation of right ankle joint, initial encounter: Secondary | ICD-10-CM

## 2017-05-20 DIAGNOSIS — Z79899 Other long term (current) drug therapy: Secondary | ICD-10-CM | POA: Diagnosis not present

## 2017-05-20 DIAGNOSIS — Z96649 Presence of unspecified artificial hip joint: Secondary | ICD-10-CM | POA: Insufficient documentation

## 2017-05-20 DIAGNOSIS — W19XXXA Unspecified fall, initial encounter: Secondary | ICD-10-CM

## 2017-05-20 DIAGNOSIS — Y9289 Other specified places as the place of occurrence of the external cause: Secondary | ICD-10-CM | POA: Diagnosis not present

## 2017-05-20 DIAGNOSIS — S82831A Other fracture of upper and lower end of right fibula, initial encounter for closed fracture: Secondary | ICD-10-CM | POA: Diagnosis not present

## 2017-05-20 DIAGNOSIS — M25511 Pain in right shoulder: Secondary | ICD-10-CM | POA: Diagnosis not present

## 2017-05-20 DIAGNOSIS — S8991XA Unspecified injury of right lower leg, initial encounter: Secondary | ICD-10-CM | POA: Diagnosis present

## 2017-05-20 LAB — CBC
HEMATOCRIT: 45.3 % (ref 39.0–52.0)
HEMOGLOBIN: 15.1 g/dL (ref 13.0–17.0)
MCH: 30.9 pg (ref 26.0–34.0)
MCHC: 33.3 g/dL (ref 30.0–36.0)
MCV: 92.6 fL (ref 78.0–100.0)
Platelets: 196 10*3/uL (ref 150–400)
RBC: 4.89 MIL/uL (ref 4.22–5.81)
RDW: 13.2 % (ref 11.5–15.5)
WBC: 5.9 10*3/uL (ref 4.0–10.5)

## 2017-05-20 LAB — BASIC METABOLIC PANEL
ANION GAP: 9 (ref 5–15)
BUN: 9 mg/dL (ref 6–20)
CHLORIDE: 106 mmol/L (ref 101–111)
CO2: 23 mmol/L (ref 22–32)
Calcium: 9.6 mg/dL (ref 8.9–10.3)
Creatinine, Ser: 0.9 mg/dL (ref 0.61–1.24)
GFR calc non Af Amer: >60 mL/min (ref >60)
GLUCOSE: 101 mg/dL — AB (ref 65–99)
POTASSIUM: 3.8 mmol/L (ref 3.5–5.1)
Sodium: 138 mmol/L (ref 135–145)

## 2017-05-20 MED ORDER — KETOROLAC TROMETHAMINE 30 MG/ML IJ SOLN
30.0000 mg | Freq: Once | INTRAMUSCULAR | Status: AC
  Administered 2017-05-20: 30 mg via INTRAVENOUS
  Filled 2017-05-20: qty 1

## 2017-05-20 MED ORDER — FENTANYL CITRATE (PF) 100 MCG/2ML IJ SOLN
INTRAMUSCULAR | Status: AC
  Administered 2017-05-20: 100 ug
  Filled 2017-05-20: qty 2

## 2017-05-20 MED ORDER — OXYCODONE-ACETAMINOPHEN 5-325 MG PO TABS
1.0000 | ORAL_TABLET | Freq: Once | ORAL | Status: AC
  Administered 2017-05-20: 1 via ORAL
  Filled 2017-05-20: qty 1

## 2017-05-20 MED ORDER — MORPHINE SULFATE (PF) 4 MG/ML IV SOLN
4.0000 mg | Freq: Once | INTRAVENOUS | Status: AC
  Administered 2017-05-20: 4 mg via INTRAVENOUS
  Filled 2017-05-20: qty 1

## 2017-05-20 MED ORDER — PROPOFOL 10 MG/ML IV BOLUS
1.0000 mg/kg | Freq: Once | INTRAVENOUS | Status: AC
  Administered 2017-05-20: 124.7 mg via INTRAVENOUS
  Filled 2017-05-20: qty 20

## 2017-05-20 MED ORDER — PROPOFOL 10 MG/ML IV BOLUS: 1.5000 mg/kg | Freq: Once | INTRAVENOUS | Status: DC

## 2017-05-20 MED ORDER — PROPOFOL 10 MG/ML IV BOLUS
1.0000 mg/kg | Freq: Once | INTRAVENOUS | Status: DC
  Filled 2017-05-20: qty 20

## 2017-05-20 MED ORDER — HYDROMORPHONE HCL 1 MG/ML IJ SOLN
INTRAMUSCULAR | Status: AC
  Filled 2017-05-20: qty 1

## 2017-05-20 MED ORDER — OXYCODONE-ACETAMINOPHEN 5-325 MG PO TABS: 1.0000 | ORAL_TABLET | Freq: Four times a day (QID) | ORAL | 0 refills | Status: DC | PRN

## 2017-05-20 MED ORDER — PROPOFOL 10 MG/ML IV BOLUS
180.0000 mg | Freq: Once | INTRAVENOUS | Status: AC
  Administered 2017-05-20: 180 mg via INTRAVENOUS
  Filled 2017-05-20: qty 20

## 2017-05-20 MED ORDER — HYDROMORPHONE HCL 1 MG/ML IJ SOLN
INTRAMUSCULAR | Status: AC | PRN
  Administered 2017-05-20: 1 mg via INTRAVENOUS

## 2017-05-20 MED ORDER — HYDROMORPHONE HCL 1 MG/ML IJ SOLN
1.0000 mg | Freq: Once | INTRAMUSCULAR | Status: DC
  Filled 2017-05-20: qty 1

## 2017-05-20 MED ORDER — FENTANYL CITRATE (PF) 100 MCG/2ML IJ SOLN
100.0000 ug | Freq: Once | INTRAMUSCULAR | Status: AC
  Administered 2017-05-20: 100 ug via INTRAVENOUS
  Filled 2017-05-20: qty 2

## 2017-05-20 MED ORDER — PROPOFOL 10 MG/ML IV BOLUS
INTRAVENOUS | Status: AC | PRN
  Administered 2017-05-20: 40 mg via INTRAVENOUS
  Administered 2017-05-20: 50 mg via INTRAVENOUS
  Administered 2017-05-20: 40 mg via INTRAVENOUS
  Administered 2017-05-20: 60 mg via INTRAVENOUS

## 2017-05-20 NOTE — Progress Notes (Signed)
Orthopedic Tech Progress Note Patient Details:  Aaron Krause 1966-05-21 [id number]  Ortho Devices Type of Ortho Device: Ace wrap Ortho Device/Splint Location: RLE Ortho Device/Splint Interventions: Ordered   Braulio Bosch 05/20/2017, 5:47 PM

## 2017-05-20 NOTE — Progress Notes (Signed)
RT at bedside for conscious sedation procedure for ankle. Jaw thrust performed when patient falling asleep. Patient bagged for about 2 minutes until patient began to spontaneously breath on his own again after meds wore off. SATs remained stable while bagging.  Patient awake and on 2L nasal cannula post procedure. Vitals stable.

## 2017-05-20 NOTE — ED Provider Notes (Addendum)
.   Please see previous physicians note regarding patient's presenting history and physical, initial ED course, and associated medical decision making.  51 year old male with right trimalleolar ankle fracture. Dr. Percell Miller has evaluated patient in the ED. He has reduced ankle at bedside under sedation, performed by me. On re-eval, he is alert, oriented, tolerating PO, feeling improved and at baseline. Stable for discharge. With follow-up with Dr. Percell Miller in 2 days.   Procedures .Sedation Date/Time: 05/21/2017 12:22 AM Performed by: Brantley Stage DUO Authorized by: Brantley Stage DUO   Consent:    Consent obtained:  Verbal and written   Consent given by:  Patient   Risks discussed:  Allergic reaction, dysrhythmia, inadequate sedation, nausea, prolonged hypoxia resulting in organ damage, prolonged sedation necessitating reversal, respiratory compromise necessitating ventilatory assistance and intubation and vomiting   Alternatives discussed:  Analgesia without sedation and anxiolysis Indications:    Procedure performed:  Fracture reduction   Procedure necessitating sedation performed by:  Different physician   Intended level of sedation:  Moderate (conscious sedation) Pre-sedation assessment:    NPO status caution: urgency dictates proceeding with non-ideal NPO status     ASA classification: class 2 - patient with mild systemic disease     Neck mobility: normal     Mouth opening:  3 or more finger widths   Thyromental distance:  4 finger widths   Mallampati score:  II - soft palate, uvula, fauces visible   Pre-sedation assessments completed and reviewed: airway patency, cardiovascular function, hydration status, mental status, nausea/vomiting and pain level   Immediate pre-procedure details:    Reassessment: Patient reassessed immediately prior to procedure     Reviewed: vital signs and relevant labs/tests     Verified: bag valve mask available, emergency equipment available, intubation equipment  available, IV patency confirmed, oxygen available and suction available   Procedure details (see MAR for exact dosages):    Preoxygenation:  Nasal cannula   Sedation:  Propofol   Intra-procedure monitoring:  Blood pressure monitoring, cardiac monitor, continuous pulse oximetry and continuous capnometry   Intra-procedure events: hypercapnia     Intra-procedure management:  BVM ventilation   Total Provider sedation time (minutes):  35 Post-procedure details:    Attendance: Constant attendance by certified staff until patient recovered     Recovery: Patient returned to pre-procedure baseline     Post-sedation assessments completed and reviewed: airway patency, cardiovascular function, hydration status, mental status, nausea/vomiting and pain level     Patient is stable for discharge or admission: yes     Patient tolerance:  Tolerated well, no immediate complications   (including critical care time)   Forde Dandy, MD 05/21/17 0024    Forde Dandy, MD 05/21/17 Laureen Abrahams

## 2017-05-20 NOTE — Consult Note (Signed)
Reason for Consult:Right ankle fx Referring Physician: [name] is an 51 y.o. male.  HPI: [name] was trying to retrieve a pool ladder from a dumpster when he lost his balance and fell onto the concrete. He heard a crack and had immediate right ankle pain. He also had some right shoulder pain that was much milder. He was not able to ambulate afterwards. He came to the ED where x-rays showed a trimal ankle fx. He underwent 2 reductions with conscious sedation by ED personnel with success on second attempt but he dislocated again after splinting. I tried without sedating him again and he displaced more.  Past Medical History:  Diagnosis Date  . Arthritis   . Diverticulitis    with perforation reported  . Ruptured disk     Past Surgical History:  Procedure Laterality Date  . ABDOMINAL SURGERY     diverticulitis perforation  . TOTAL HIP ARTHROPLASTY      History reviewed. No pertinent family history.  Social History:  reports that he quit smoking about 7 years ago. His smoking use included Cigarettes. He started smoking about 23 years ago. He smoked 1.00 pack per day. He does not have any smokeless tobacco history on file. He reports that he does not drink alcohol or use drugs.  Allergies: No Known Allergies  Medications: I have reviewed the patient's current medications.  Results for orders placed or performed during the hospital encounter of 05/20/17 (from the past 48 hour(s))  CBC     Status: None   Collection Time: 05/20/17 10:40 AM  Result Value Ref Range   WBC 5.9 4.0 - 10.5 K/uL   RBC 4.89 4.22 - 5.81 MIL/uL   Hemoglobin 15.1 13.0 - 17.0 g/dL   HCT 90.5 03.9 - 97.1 %   MCV 92.6 78.0 - 100.0 fL   MCH 30.9 26.0 - 34.0 pg   MCHC 33.3 30.0 - 36.0 g/dL   RDW 37.3 45.6 - 34.6 %   Platelets 196 150 - 400 K/uL  Basic metabolic panel     Status: Abnormal   Collection Time: 05/20/17 10:40 AM  Result Value Ref Range   Sodium 138 135 - 145 mmol/L   Potassium 3.8  3.5 - 5.1 mmol/L   Chloride 106 101 - 111 mmol/L   CO2 23 22 - 32 mmol/L   Glucose, Bld 101 (H) 65 - 99 mg/dL   BUN 9 6 - 20 mg/dL   Creatinine, Ser 1.71 0.61 - 1.24 mg/dL   Calcium 9.6 8.9 - 85.4 mg/dL   GFR calc non Af Amer >60 >60 mL/min   GFR calc Af Amer >60 >60 mL/min    Comment: (NOTE) The eGFR has been calculated using the CKD EPI equation. This calculation has not been validated in all clinical situations. eGFR's persistently <60 mL/min signify possible Chronic Kidney Disease.    Anion gap 9 5 - 15    Dg Chest Portable 1 View  Result Date: 05/20/2017 CLINICAL DATA:  Right shoulder injury due to a fall from a dumpster today. EXAM: PORTABLE CHEST 1 VIEW COMPARISON:  None. FINDINGS: Lungs are clear. Heart size is normal. No pneumothorax or pleural effusion. No bony abnormality. IMPRESSION: Negative chest. Electronically Signed   By: Drusilla Kanner M.D.   On: 05/20/2017 11:46   Dg Shoulder Right Port  Result Date: 05/20/2017 CLINICAL DATA:  Fall with right shoulder pain and deformity. EXAM: PORTABLE RIGHT SHOULDER COMPARISON:  None. FINDINGS: Two-view exam obtained portably shows  no fracture. No evidence for shoulder separation or dislocation. IMPRESSION: No acute bony abnormality on this two-view portable study. Electronically Signed   By: Misty Stanley M.D.   On: 05/20/2017 13:08   Dg Shoulder Right Portable  Result Date: 05/20/2017 CLINICAL DATA:  51 year old male with right shoulder pain post fall. Initial encounter. EXAM: PORTABLE RIGHT SHOULDER COMPARISON:  None. FINDINGS: Single portable view of the right shoulder was only able to be obtained. On this limited exam, no right shoulder fracture or dislocation is noted. Follow-up imaging may be considered if the patient has persistent pain and can tolerate standard shoulder series. IMPRESSION: Single portable view of the right shoulder without fracture or dislocation detected as detailed above. Electronically Signed   By:  Genia Del M.D.   On: 05/20/2017 11:55   Dg Ankle Right Port  Result Date: 05/20/2017 CLINICAL DATA:  Post reduction RIGHT ankle EXAM: PORTABLE RIGHT ANKLE - 2 VIEW COMPARISON:  05/20/2017 FINDINGS: Fiberglass splint material obscures bone detail. Oblique fracture of the lateral malleolus with increased lateral displacement since previous exam. Lateral subluxation of the talus with widening of the medial and anterior joint line. Previously identified avulsion fragment at the tip of the medial malleolus is not well visualized. Minimally displaced posterior malleolar fracture fragment unchanged. Small plantar calcaneal spur. IMPRESSION: Slightly increased lateral malleolar fracture displacement and subluxation of the talus at the tibiotalar joint versus the earlier study as above. Electronically Signed   By: Lavonia Dana M.D.   On: 05/20/2017 15:19   Dg Ankle Right Port  Result Date: 05/20/2017 CLINICAL DATA:  Post reduction. EXAM: PORTABLE RIGHT ANKLE - 2 VIEW COMPARISON:  Right ankle x-rays from same day at 12:27. FINDINGS: Interval reduction of the tibiotalar dislocation. Oblique fracture through the distal fibula, in near anatomic alignment. Possible minimally displaced posterior malleolar fracture. Tiny avulsion fracture at the tip of the medial malleolus. Bone mineralization is normal. Diffuse soft tissue swelling about the ankle. IMPRESSION: 1. Interval reduction of tibiotalar dislocation. 2. Oblique distal fibular fracture, in near anatomic alignment. 3. Probable minimally displaced posterior and medial malleolar fractures. Electronically Signed   By: Titus Dubin M.D.   On: 05/20/2017 15:04   Dg Ankle Right Port  Result Date: 05/20/2017 CLINICAL DATA:  Status post reduction EXAM: PORTABLE RIGHT ANKLE - 2 VIEW COMPARISON:  Films from earlier the same day FINDINGS: Casting material is now seen. Previously seen distal fibular fracture is again identified but slightly reduced when compared  with the prior exam. There remains disruption of the tibiotalar joint with the talus somewhat posteriorly and laterally displaced with respect to the distal tibia. IMPRESSION: Slight reduction in fibular fracture. There remains dislocation of the talus with respect to the distal tibia. Electronically Signed   By: Inez Catalina M.D.   On: 05/20/2017 12:52   Dg Ankle Right Port  Result Date: 05/20/2017 CLINICAL DATA:  Pain following fall EXAM: PORTABLE RIGHT ANKLE - 2 VIEW COMPARISON:  None. FINDINGS: Frontal and lateral views were obtained. There is a comminuted fracture of the distal fibular diaphysis with displaced fracture fragments. The major distal fracture fragment is displaced and angulated laterally. There is gross disruption of the ankle mortise with the foot displaced laterally and posteriorly to the tibial plafond. A small bony fragment is noted in the medial aspect of the joint, likely arising from the medial malleolus. There are small posterior and inferior calcaneal spurs. IMPRESSION: Comminuted fracture distal fibula with displaced and angulated major fracture fragments. Probable avulsion  arising from the medial malleolus. Gross disruption of the ankle mortise. Small calcaneal spurs noted. Electronically Signed   By: Lowella Grip III M.D.   On: 05/20/2017 11:57    Review of Systems  Constitutional: Negative for weight loss.  HENT: Negative for ear discharge, ear pain, hearing loss and tinnitus.   Eyes: Negative for blurred vision, double vision, photophobia and pain.  Respiratory: Negative for cough, sputum production and shortness of breath.   Cardiovascular: Negative for chest pain.  Gastrointestinal: Negative for abdominal pain, nausea and vomiting.  Genitourinary: Negative for dysuria, flank pain, frequency and urgency.  Musculoskeletal: Positive for joint pain (Right ankle). Negative for back pain, falls, myalgias and neck pain.  Neurological: Negative for dizziness, tingling,  sensory change, focal weakness, loss of consciousness and headaches.  Endo/Heme/Allergies: Does not bruise/bleed easily.  Psychiatric/Behavioral: Negative for depression, memory loss and substance abuse. The patient is not nervous/anxious.    Blood pressure (!) 140/96, pulse 83, temperature 97.9 F (36.6 C), temperature source Oral, resp. rate 19, height '5\' 11"'$  (1.803 m), weight 124.7 kg (275 lb), SpO2 100 %. Physical Exam  Constitutional: He appears well-developed and well-nourished. No distress.  HENT:  Head: Normocephalic.  Eyes: Conjunctivae are normal. Right eye exhibits no discharge. Left eye exhibits no discharge. No scleral icterus.  Neck: Normal range of motion.  Cardiovascular: Normal rate and regular rhythm.   Respiratory: Effort normal. No respiratory distress.  Musculoskeletal:  Right shoulder, elbow, wrist, digits- no skin wounds, diffuse TTP shoulder, especially laterally, no instability, no blocks to motion  Sens  Ax/R/M/U intact  Mot   Ax/ R/ PIN/ M/ AIN/ U intact  Rad 2+  RLE No traumatic wounds, ecchymosis, or rash  Ankle TTP, swollen  No knee effusion  Knee stable to varus/ valgus and anterior/posterior stress  Sens DPN, SPN, TN intact  Motor EHL, ext, flex, evers 5/5  DP 2+, No significant edema  Neurological: He is alert.  Skin: Skin is warm and dry. He is not diaphoretic.  Psychiatric: He has a normal mood and affect. His behavior is normal.    Assessment/Plan: Fall Right shoulder pain -- Will need PT, may need MR at later date to r/o rotator cuff pathology. Right trimal ankle fx -- Will attempt CR in ED one more time by Dr. Percell Miller with CS. If successful will plan d/c home with plans for delayed ORIF.    Aaron Abu, PA-C Orthopedic Surgery [phone] 05/20/2017, 4:32 PM

## 2017-05-20 NOTE — ED Provider Notes (Signed)
[facility] EMERGENCY DEPARTMENT Provider Note   CSN: [id number] Arrival date & time:        History   Chief Complaint Chief Complaint  Patient presents with  . Fall    HPI Aaron Krause is a 51 y.o. male.  HPI  51 y.o. male, presents to the Emergency Department today due to fall. Pt states he was climbing on top of a dumpster at work when he lost his balance and fell backwards. Notes falling 3 feet. Landed on right ankle as well as right shoulder. Denies head trauma or LOC. Brought in by EMS. Noted obvious deformity to right ankle and shoulder. Rates pain 7/10. Throbbing sensation. Worse with movement. Pulses noted. No numbness/tingling. No other symptoms noted   Past Medical History:  Diagnosis Date  . Arthritis   . Diverticulitis    with perforation reported  . Ruptured disk     Patient Active Problem List   Diagnosis Date Noted  . Diverticulitis of colon 05/03/2015    Past Surgical History:  Procedure Laterality Date  . ABDOMINAL SURGERY     diverticulitis perforation  . TOTAL HIP ARTHROPLASTY         Home Medications    Prior to Admission medications   Medication Sig Start Date End Date Taking? Authorizing Provider  acetaminophen (TYLENOL) 500 MG tablet Take 1,000 mg by mouth every 6 (six) hours as needed for pain.    [provider]  cyclobenzaprine (FLEXERIL) 10 MG tablet Take 1 tablet (10 mg total) by mouth 3 (three) times daily as needed for muscle spasms. 03/18/15   Claretta Fraise, MD  oxyCODONE-acetaminophen (PERCOCET/ROXICET) 5-325 MG per tablet Take 2 tablets by mouth once. 02/15/13   Szekalski, Kaitlyn, PA-C  senna (SENOKOT) 8.6 MG tablet Take 2 tablets by mouth 2 (two) times daily.    [provider]    Family History History reviewed. No pertinent family history.  Social History Social History  Substance Use Topics  . Smoking status: Former Smoker    Packs/day: 1.00    Types: Cigarettes    Start date:  09/17/1993    Quit date: 09/17/2009  . Smokeless tobacco: Not on file  . Alcohol use No     Allergies   Patient has no known allergies.   Review of Systems Review of Systems ROS reviewed and all are negative for acute change except as noted in the HPI.  Physical Exam Updated Vital Signs BP (!) 142/81 (BP Location: Left Arm)   Pulse 80   Temp 97.7 F (36.5 C) (Temporal)   Resp 16   Ht 5\' 11"  (1.803 m)   Wt 124.7 kg (275 lb)   SpO2 98%   BMI 38.35 kg/m   Physical Exam  Constitutional: He is oriented to person, place, and time. Vital signs are normal. He appears well-developed and well-nourished.  HENT:  Head: Normocephalic and atraumatic.  Right Ear: Hearing normal.  Left Ear: Hearing normal.  Eyes: Pupils are equal, round, and reactive to light. Conjunctivae and EOM are normal.  Neck: Normal range of motion. Neck supple.  Cardiovascular: Normal rate, regular rhythm, normal heart sounds and intact distal pulses.   Pulmonary/Chest: Effort normal and breath sounds normal.  Musculoskeletal: Normal range of motion.  Right ankle with obvious deformity noted. External rotated. NVI. Distal pulses appreciated. No open wounds. Also noted right shoulder deformity with anterior dislocation. NVI. Distal pulses appreciated. Limited ROM due to pain.   Neurological: He is alert and oriented  to person, place, and time.  Skin: Skin is warm and dry.  Psychiatric: He has a normal mood and affect. His speech is normal and behavior is normal. Thought content normal.  Nursing note and vitals reviewed.    ED Treatments / Results  Labs (all labs ordered are listed, but only abnormal results are displayed) Labs Reviewed  BASIC METABOLIC PANEL - Abnormal; Notable for the following:       Result Value   Glucose, Bld 101 (*)    All other components within normal limits  CBC    EKG  EKG Interpretation None       Radiology Dg Chest Portable 1 View  Result Date: 05/20/2017 CLINICAL  DATA:  Right shoulder injury due to a fall from a dumpster today. EXAM: PORTABLE CHEST 1 VIEW COMPARISON:  None. FINDINGS: Lungs are clear. Heart size is normal. No pneumothorax or pleural effusion. No bony abnormality. IMPRESSION: Negative chest. Electronically Signed   By: Inge Rise M.D.   On: 05/20/2017 11:46   Dg Shoulder Right Port  Result Date: 05/20/2017 CLINICAL DATA:  Fall with right shoulder pain and deformity. EXAM: PORTABLE RIGHT SHOULDER COMPARISON:  None. FINDINGS: Two-view exam obtained portably shows no fracture. No evidence for shoulder separation or dislocation. IMPRESSION: No acute bony abnormality on this two-view portable study. Electronically Signed   By: Misty Stanley M.D.   On: 05/20/2017 13:08   Dg Shoulder Right Portable  Result Date: 05/20/2017 CLINICAL DATA:  51 year old male with right shoulder pain post fall. Initial encounter. EXAM: PORTABLE RIGHT SHOULDER COMPARISON:  None. FINDINGS: Single portable view of the right shoulder was only able to be obtained. On this limited exam, no right shoulder fracture or dislocation is noted. Follow-up imaging may be considered if the patient has persistent pain and can tolerate standard shoulder series. IMPRESSION: Single portable view of the right shoulder without fracture or dislocation detected as detailed above. Electronically Signed   By: Genia Del M.D.   On: 05/20/2017 11:55   Dg Ankle Right Port  Result Date: 05/20/2017 CLINICAL DATA:  Status post reduction EXAM: PORTABLE RIGHT ANKLE - 2 VIEW COMPARISON:  Films from earlier the same day FINDINGS: Casting material is now seen. Previously seen distal fibular fracture is again identified but slightly reduced when compared with the prior exam. There remains disruption of the tibiotalar joint with the talus somewhat posteriorly and laterally displaced with respect to the distal tibia. IMPRESSION: Slight reduction in fibular fracture. There remains dislocation of the  talus with respect to the distal tibia. Electronically Signed   By: Inez Catalina M.D.   On: 05/20/2017 12:52   Dg Ankle Right Port  Result Date: 05/20/2017 CLINICAL DATA:  Pain following fall EXAM: PORTABLE RIGHT ANKLE - 2 VIEW COMPARISON:  None. FINDINGS: Frontal and lateral views were obtained. There is a comminuted fracture of the distal fibular diaphysis with displaced fracture fragments. The major distal fracture fragment is displaced and angulated laterally. There is gross disruption of the ankle mortise with the foot displaced laterally and posteriorly to the tibial plafond. A small bony fragment is noted in the medial aspect of the joint, likely arising from the medial malleolus. There are small posterior and inferior calcaneal spurs. IMPRESSION: Comminuted fracture distal fibula with displaced and angulated major fracture fragments. Probable avulsion arising from the medial malleolus. Gross disruption of the ankle mortise. Small calcaneal spurs noted. Electronically Signed   By: Lowella Grip III M.D.   On: 05/20/2017 11:57  Procedures Reduction of fracture dislocation Date/Time: 05/20/2017 4:30 PM Performed by: Shary Decamp Authorized by: Shary Decamp  Consent: Verbal consent obtained. Written consent obtained. Risks and benefits: risks, benefits and alternatives were discussed Consent given by: patient Patient understanding: patient states understanding of the procedure being performed Patient consent: the patient's understanding of the procedure matches consent given Procedure consent: procedure consent matches procedure scheduled Relevant documents: relevant documents present and verified Site marked: the operative site was marked Imaging studies: imaging studies available Required items: required blood products, implants, devices, and special equipment available Patient identity confirmed: verbally with patient and arm band  Sedation: Patient sedated: yes Sedation type:  moderate (conscious) sedation Sedatives: propofol Vitals: Vital signs were monitored during sedation. Patient tolerance: Patient tolerated the procedure well with no immediate complications Comments: Splint placed. NVI. Distal pulses appreciated     (including critical care time)  Medications Ordered in ED Medications  HYDROmorphone (DILAUDID) 1 MG/ML injection (not administered)  morphine 4 MG/ML injection 4 mg (not administered)  morphine 4 MG/ML injection 4 mg (4 mg Intravenous Given 05/20/17 1054)  propofol (DIPRIVAN) 10 mg/mL bolus/IV push 124.7 mg (124.7 mg Intravenous Given 05/20/17 1213)  fentaNYL (SUBLIMAZE) 100 MCG/2ML injection (100 mcg  Given 05/20/17 1116)  propofol (DIPRIVAN) 10 mg/mL bolus/IV push (50 mg Intravenous Given 05/20/17 1445)  HYDROmorphone (DILAUDID) injection (1 mg Intravenous Given 05/20/17 1230)  ketorolac (TORADOL) 30 MG/ML injection 30 mg (30 mg Intravenous Given 05/20/17 1252)  propofol (DIPRIVAN) 10 mg/mL bolus/IV push 124.7 mg (124.7 mg Intravenous Given 05/20/17 1321)  morphine 4 MG/ML injection 4 mg (4 mg Intravenous Given 05/20/17 1429)     Initial Impression / Assessment and Plan / ED Course  I have reviewed the triage vital signs and the nursing notes.  Pertinent labs & imaging results that were available during my care of the patient were reviewed by me and considered in my medical decision making (see chart for details).  Final Clinical Impressions(s) / ED Diagnoses  {I have reviewed and evaluated the relevant laboratory values. {I have reviewed and evaluated the relevant imaging studies.  {I have reviewed the relevant previous healthcare records.  {I obtained HPI from historian. {Patient discussed with supervising physician.  ED Course:  Assessment: Patient X-Ray shoulder unremarkable. Likely rotator cuff tear vs sprain. Xray ankle with lateral ankle dislocation and distal fib fracture. Sedated and reduced in ED after 2 attempts. Splinted.  NVI. Distal pulses appreciated. After repeat imaging, pt found to be slightly dislocated. Consult to Ortho [facility], PA-C). Likely due to tri-mal fracture. Will reduce in ED and use Plaster at this time instead of Ortho-Glass. Pt was aligned at bedside with imaging done at that time, but ortho glass did not hold alignment. Pt advised to follow up with orthopedics. (see ortho consult note for further reduction).  4:29 PM- Attempted to reduce x 3 and unchanged. Consult placed to Ortho (Dr. Percell Miller) to ED for further evaluation. See note for details  Disposition/Plan:  Pending Ortho Consult Pt acknowledges and agrees with plan  Supervising Physician Charlesetta Shanks, MD  Final diagnoses:  Fall  Dislocation of right ankle joint, initial encounter  Closed fracture of distal end of right fibula, unspecified fracture morphology, initial encounter  Acute pain of right shoulder    New Prescriptions New Prescriptions   No medications on file     Shary Decamp, PA-C 05/20/17 1534    Shary Decamp, PA-C 05/20/17 1631    Charlesetta Shanks, MD 05/21/17 1658

## 2017-05-20 NOTE — ED Triage Notes (Signed)
Pt arrives via EMS from workplace where he fell approx 68ft off dumpster states felt a pop in right ankle. Pt with right ankle deformity and right shoulder deformity. Pt alert, oriented x4, VSS.

## 2017-05-20 NOTE — ED Provider Notes (Addendum)
Medical screening examination/treatment/procedure(s) were conducted as a shared visit with non-physician practitioner(s) and myself.  I personally evaluated the patient during the encounter.   EKG Interpretation None     Patient was seen in conjunction with PA-C Moore.  Patient fell from a dumpster approximately 3 feet.  Landing on his right side.  Patient complains of pain in his right shoulder and right ankle which has clear deformity.  No chest pain or shortness of breath.  No head injury, headache or neck pain.  No weakness numbness tingling of extremities except as per distinct injuries of right ankle and right shoulder.  Neurologically and vascularly intact. Physical Exam  BP 133/78   Pulse 92   Temp 97.7 F (36.5 C) (Temporal)   Resp (!) 22   Ht 5\' 11"  (1.803 m)   Wt 124.7 kg (275 lb)   SpO2 98%   BMI 38.35 kg/m   Physical Exam Pre-Sedation physical exam performed. Status is clear. Normal cephalic atraumatic. Normal Jaw opening. Cervical spine nontender with normal range of motion. Lungs clear without wheeze rhonchi or rale.  Heart regular.  ED Course  .Sedation Date/Time: 05/20/2017 12:05 PM Performed by: Charlesetta Shanks Authorized by: Charlesetta Shanks   Consent:    Consent obtained:  Verbal and written   Consent given by:  Patient   Risks discussed:  Allergic reaction, dysrhythmia, inadequate sedation, nausea, prolonged hypoxia resulting in organ damage, prolonged sedation necessitating reversal, respiratory compromise necessitating ventilatory assistance and intubation and vomiting Indications:    Procedure performed:  Dislocation reduction   Procedure necessitating sedation performed by:  Different physician   Intended level of sedation:  Moderate (conscious sedation) Pre-sedation assessment:    Time since last food or drink:  08:00   NPO status caution: urgency dictates proceeding with non-ideal NPO status     ASA classification: class 1 - normal, healthy  patient     Neck mobility: normal     Mouth opening:  3 or more finger widths   Thyromental distance:  4 finger widths   Mallampati score:  II - soft palate, uvula, fauces visible   Pre-sedation assessments completed and reviewed: airway patency, cardiovascular function, hydration status, mental status, nausea/vomiting, pain level, respiratory function and temperature     Pre-sedation assessment completed:  05/20/2017 11:30 AM Immediate pre-procedure details:    Reassessment: Patient reassessed immediately prior to procedure     Reviewed: vital signs, relevant labs/tests and NPO status     Verified: bag valve mask available, emergency equipment available, intubation equipment available, IV patency confirmed, oxygen available, reversal medications available and suction available   Procedure details (see MAR for exact dosages):    Preoxygenation:  Nasal cannula   Sedation:  Propofol   Intra-procedure monitoring:  Blood pressure monitoring, cardiac monitor, continuous capnometry, continuous pulse oximetry, frequent LOC assessments and frequent vital sign checks   Intra-procedure events: none     Total Provider sedation time (minutes):  20 Post-procedure details:    Post-sedation assessment completed:  05/20/2017 12:37 PM   Attendance: Constant attendance by certified staff until patient recovered     Recovery: Patient returned to pre-procedure baseline     Post-sedation assessments completed and reviewed: airway patency, cardiovascular function, hydration status, mental status, nausea/vomiting, pain level and respiratory function     Patient is stable for discharge or admission: yes     Patient tolerance:  Tolerated well, no immediate complications    I did directly supervised the reduction done by Dawna Part.  After first reduction, alignment appeared good.  The previous deformity was significantly resolved with the foot in the midline.  I did confirm that he had continued good pedal pulse  which was present.  It was then splinted with Ortho-Glass by [facility]'s.  Post reduction x-ray however showed the patient to still have significant gap in the mortise.  Due to the persistent displacement from the mortise, patient had second uncomplicated sedation and reduction., At that time I did have x-ray at bedside to confirm patient's positioning postreduction and during sedation.  On the second attempt,  x-ray confirmed excellent alignment.  Patient was then splinted.  After the second splinting completed, repeat x-rays obtained in the splint,  due to amount of laxity, the mortis was again widened in the splint. Orthopedics consulted and proceeded with realignment and plan to splint with plaster to better maintain postreduction positioning.  See orthopedic notes for further reductions and cast/splint application.  Patient tolerated sedation and splinting well.     Charlesetta Shanks, MD 05/21/17 1658    Charlesetta Shanks, MD 05/21/17 [phone]

## 2017-05-20 NOTE — Progress Notes (Signed)
I performed a closed reduction of his R ankle. Xrays demonstrated appropriate reduction. He was NVI post reduction.   Plan:  NWB RLE Elevate F/u Wed am at 8:30 at my office    Brownie Nehme D

## 2017-05-20 NOTE — Discharge Instructions (Signed)
You have a 8:30 AM appointment with Dr. Percell Miller on Wednesday   Do not bear weight on the right leg Keep elevated above the heart at rest

## 2017-05-20 NOTE — Progress Notes (Signed)
Patient bagged for 5 minutes during conscious sedation for ankle procedure. Patient awake and on 2 L post procedure. Vitals stable throughout.

## 2017-05-20 NOTE — Progress Notes (Signed)
Orthopedic Tech Progress Note Patient Details:  Aaron Krause 29-Nov-1965 [id number]  Ortho Devices Type of Ortho Device: Short leg splint Ortho Device/Splint Location: applied short leg splint to stirrup to pt right leg/ankle.  pt tolerated well.  Right Leg/ankle Ortho Device/Splint Interventions: Application, Adjustment   Kristopher Oppenheim 05/20/2017, 12:36 PM

## 2017-06-03 ENCOUNTER — Encounter (HOSPITAL_BASED_OUTPATIENT_CLINIC_OR_DEPARTMENT_OTHER): Payer: Self-pay | Admitting: *Deleted

## 2017-06-06 ENCOUNTER — Encounter (HOSPITAL_BASED_OUTPATIENT_CLINIC_OR_DEPARTMENT_OTHER): Payer: Self-pay

## 2017-06-06 ENCOUNTER — Encounter (HOSPITAL_BASED_OUTPATIENT_CLINIC_OR_DEPARTMENT_OTHER)
Admission: RE | Disposition: A | Discharge status: Home / Self Care | Payer: Self-pay | Source: Ambulatory Visit | Attending: Orthopedic Surgery

## 2017-06-06 ENCOUNTER — Ambulatory Visit (HOSPITAL_BASED_OUTPATIENT_CLINIC_OR_DEPARTMENT_OTHER): Payer: Worker's Compensation | Admitting: Anesthesiology

## 2017-06-06 ENCOUNTER — Other Ambulatory Visit: Payer: Self-pay

## 2017-06-06 ENCOUNTER — Other Ambulatory Visit: Payer: Self-pay | Admitting: Orthopedic Surgery

## 2017-06-06 ENCOUNTER — Ambulatory Visit (HOSPITAL_BASED_OUTPATIENT_CLINIC_OR_DEPARTMENT_OTHER)
Admission: RE | Admit: 2017-06-06 | Discharge: 2017-06-06 | Disposition: A | Discharge status: Home / Self Care | Payer: Worker's Compensation | Source: Ambulatory Visit | Attending: Orthopedic Surgery | Admitting: Orthopedic Surgery

## 2017-06-06 DIAGNOSIS — Y990 Civilian activity done for income or pay: Secondary | ICD-10-CM | POA: Diagnosis not present

## 2017-06-06 DIAGNOSIS — W1789XA Other fall from one level to another, initial encounter: Secondary | ICD-10-CM | POA: Insufficient documentation

## 2017-06-06 DIAGNOSIS — Z87891 Personal history of nicotine dependence: Secondary | ICD-10-CM | POA: Diagnosis not present

## 2017-06-06 DIAGNOSIS — M1990 Unspecified osteoarthritis, unspecified site: Secondary | ICD-10-CM | POA: Insufficient documentation

## 2017-06-06 DIAGNOSIS — Z6838 Body mass index (BMI) 38.0-38.9, adult: Secondary | ICD-10-CM | POA: Insufficient documentation

## 2017-06-06 DIAGNOSIS — S82851A Displaced trimalleolar fracture of right lower leg, initial encounter for closed fracture: Secondary | ICD-10-CM | POA: Insufficient documentation

## 2017-06-06 DIAGNOSIS — Z79899 Other long term (current) drug therapy: Secondary | ICD-10-CM | POA: Insufficient documentation

## 2017-06-06 DIAGNOSIS — Z9889 Other specified postprocedural states: Secondary | ICD-10-CM | POA: Diagnosis not present

## 2017-06-06 HISTORY — PX: ORIF ANKLE FRACTURE: SHX5408

## 2017-06-06 HISTORY — DX: Displaced trimalleolar fracture of right lower leg, initial encounter for closed fracture: S82.851A

## 2017-06-06 SURGERY — OPEN REDUCTION INTERNAL FIXATION (ORIF) ANKLE FRACTURE
Anesthesia: General | Site: Ankle | Laterality: Right

## 2017-06-06 MED ORDER — CEFAZOLIN SODIUM-DEXTROSE 1-4 GM/50ML-% IV SOLN
INTRAVENOUS | Status: AC
  Filled 2017-06-06: qty 50

## 2017-06-06 MED ORDER — ROPIVACAINE HCL 7.5 MG/ML IJ SOLN
INTRAMUSCULAR | Status: DC | PRN
  Administered 2017-06-06: 20 mL via PERINEURAL

## 2017-06-06 MED ORDER — OXYCODONE HCL 5 MG/5ML PO SOLN: 5.0000 mg | Freq: Once | ORAL | Status: DC | PRN

## 2017-06-06 MED ORDER — ACETAMINOPHEN 160 MG/5ML PO SOLN: 325.0000 mg | ORAL | Status: DC | PRN

## 2017-06-06 MED ORDER — BUPIVACAINE-EPINEPHRINE (PF) 0.5% -1:200000 IJ SOLN
INTRAMUSCULAR | Status: DC | PRN
Start: 2017-06-06 — End: 2017-06-06
  Administered 2017-06-06: 30 mL via PERINEURAL

## 2017-06-06 MED ORDER — MIDAZOLAM HCL 2 MG/2ML IJ SOLN
INTRAMUSCULAR | Status: AC
  Filled 2017-06-06: qty 2

## 2017-06-06 MED ORDER — SODIUM CHLORIDE 0.9 % IV SOLN: INTRAVENOUS | Status: DC

## 2017-06-06 MED ORDER — DOCUSATE SODIUM 100 MG PO CAPS: 100.0000 mg | ORAL_CAPSULE | Freq: Two times a day (BID) | ORAL | 0 refills | Status: AC

## 2017-06-06 MED ORDER — ASPIRIN EC 81 MG PO TBEC: 81.0000 mg | DELAYED_RELEASE_TABLET | Freq: Two times a day (BID) | ORAL | 0 refills | Status: AC

## 2017-06-06 MED ORDER — FENTANYL CITRATE (PF) 100 MCG/2ML IJ SOLN
50.0000 ug | INTRAMUSCULAR | Status: AC | PRN
  Administered 2017-06-06: 100 ug via INTRAVENOUS
  Administered 2017-06-06 (×2): 25 ug via INTRAVENOUS

## 2017-06-06 MED ORDER — CEFAZOLIN SODIUM-DEXTROSE 2-4 GM/100ML-% IV SOLN
INTRAVENOUS | Status: AC
  Filled 2017-06-06: qty 100

## 2017-06-06 MED ORDER — OXYCODONE HCL 5 MG PO TABS: 5.0000 mg | ORAL_TABLET | ORAL | 0 refills | Status: AC | PRN

## 2017-06-06 MED ORDER — FENTANYL CITRATE (PF) 100 MCG/2ML IJ SOLN
INTRAMUSCULAR | Status: AC
Start: 2017-06-06 — End: 2017-06-06
  Filled 2017-06-06: qty 2

## 2017-06-06 MED ORDER — MIDAZOLAM HCL 2 MG/2ML IJ SOLN
1.0000 mg | INTRAMUSCULAR | Status: DC | PRN
  Administered 2017-06-06: 2 mg via INTRAVENOUS

## 2017-06-06 MED ORDER — FENTANYL CITRATE (PF) 100 MCG/2ML IJ SOLN: 25.0000 ug | INTRAMUSCULAR | Status: DC | PRN

## 2017-06-06 MED ORDER — BUPIVACAINE-EPINEPHRINE 0.25% -1:200000 IJ SOLN
INTRAMUSCULAR | Status: DC | PRN
  Administered 2017-06-06: 10 mL

## 2017-06-06 MED ORDER — SCOPOLAMINE 1 MG/3DAYS TD PT72: 1.0000 | MEDICATED_PATCH | Freq: Once | TRANSDERMAL | Status: DC | PRN

## 2017-06-06 MED ORDER — ACETAMINOPHEN 325 MG PO TABS: 325.0000 mg | ORAL_TABLET | ORAL | Status: DC | PRN

## 2017-06-06 MED ORDER — CHLORHEXIDINE GLUCONATE 4 % EX LIQD: 60.0000 mL | Freq: Once | CUTANEOUS | Status: DC

## 2017-06-06 MED ORDER — DEXAMETHASONE SODIUM PHOSPHATE 10 MG/ML IJ SOLN
INTRAMUSCULAR | Status: DC | PRN
  Administered 2017-06-06: 10 mg via INTRAVENOUS

## 2017-06-06 MED ORDER — LIDOCAINE HCL (CARDIAC) 20 MG/ML IV SOLN
INTRAVENOUS | Status: DC | PRN
  Administered 2017-06-06: 30 mg via INTRAVENOUS

## 2017-06-06 MED ORDER — ONDANSETRON HCL 4 MG/2ML IJ SOLN
INTRAMUSCULAR | Status: DC | PRN
Start: 2017-06-06 — End: 2017-06-06
  Administered 2017-06-06: 4 mg via INTRAVENOUS

## 2017-06-06 MED ORDER — LACTATED RINGERS IV SOLN
INTRAVENOUS | Status: DC
  Administered 2017-06-06 (×2): via INTRAVENOUS

## 2017-06-06 MED ORDER — PROPOFOL 10 MG/ML IV BOLUS
INTRAVENOUS | Status: DC | PRN
  Administered 2017-06-06: 200 mg via INTRAVENOUS

## 2017-06-06 MED ORDER — OXYCODONE HCL 5 MG PO TABS: 5.0000 mg | ORAL_TABLET | Freq: Once | ORAL | Status: DC | PRN

## 2017-06-06 MED ORDER — SENNA 8.6 MG PO TABS: 2.0000 | ORAL_TABLET | Freq: Two times a day (BID) | ORAL | 0 refills | Status: AC

## 2017-06-06 MED ORDER — FENTANYL CITRATE (PF) 100 MCG/2ML IJ SOLN
INTRAMUSCULAR | Status: AC
  Filled 2017-06-06: qty 2

## 2017-06-06 MED ORDER — CEFAZOLIN SODIUM-DEXTROSE 2-4 GM/100ML-% IV SOLN
2.0000 g | INTRAVENOUS | Status: AC
  Administered 2017-06-06: 3 g via INTRAVENOUS

## 2017-06-06 MED ORDER — 0.9 % SODIUM CHLORIDE (POUR BTL) OPTIME
TOPICAL | Status: DC | PRN
  Administered 2017-06-06: 150 mL

## 2017-06-06 SURGICAL SUPPLY — 80 items
BANDAGE ESMARK 6X9 LF (GAUZE/BANDAGES/DRESSINGS) ×1 IMPLANT
BIT DRILL 2.5X2.75 QC CALB (BIT) ×2 IMPLANT
BIT DRILL 2.9 CANN QC NONSTRL (BIT) ×2 IMPLANT
BIT DRILL 3.5X5.5 QC CALB (BIT) ×2 IMPLANT
BLADE SURG 15 STRL LF DISP TIS (BLADE) ×2 IMPLANT
BLADE SURG 15 STRL SS (BLADE) ×6
BNDG CMPR 9X4 STRL LF SNTH (GAUZE/BANDAGES/DRESSINGS)
BNDG CMPR 9X6 STRL LF SNTH (GAUZE/BANDAGES/DRESSINGS) ×1
BNDG COHESIVE 4X5 TAN STRL (GAUZE/BANDAGES/DRESSINGS) ×5 IMPLANT
BNDG COHESIVE 6X5 TAN STRL LF (GAUZE/BANDAGES/DRESSINGS) ×3 IMPLANT
BNDG ESMARK 4X9 LF (GAUZE/BANDAGES/DRESSINGS) IMPLANT
BNDG ESMARK 6X9 LF (GAUZE/BANDAGES/DRESSINGS) ×3
CANISTER SUCT 1200ML W/VALVE (MISCELLANEOUS) ×3 IMPLANT
CHLORAPREP W/TINT 26ML (MISCELLANEOUS) ×3 IMPLANT
COVER BACK TABLE 60X90IN (DRAPES) ×3 IMPLANT
CUFF TOURNIQUET SINGLE 34IN LL (TOURNIQUET CUFF) ×2 IMPLANT
CUFF TOURNIQUET SINGLE 44IN (TOURNIQUET CUFF) ×2 IMPLANT
DECANTER SPIKE VIAL GLASS SM (MISCELLANEOUS) IMPLANT
DRAPE EXTREMITY T 121X128X90 (DRAPE) ×3 IMPLANT
DRAPE OEC MINIVIEW 54X84 (DRAPES) ×3 IMPLANT
DRAPE U-SHAPE 47X51 STRL (DRAPES) ×3 IMPLANT
DRSG MEPITEL 4X7.2 (GAUZE/BANDAGES/DRESSINGS) ×3 IMPLANT
DRSG PAD ABDOMINAL 8X10 ST (GAUZE/BANDAGES/DRESSINGS) ×6 IMPLANT
ELECT REM PT RETURN 9FT ADLT (ELECTROSURGICAL) ×3
ELECTRODE REM PT RTRN 9FT ADLT (ELECTROSURGICAL) ×1 IMPLANT
GAUZE SPONGE 4X4 12PLY STRL (GAUZE/BANDAGES/DRESSINGS) ×3 IMPLANT
GLOVE BIO SURGEON STRL SZ 6 (GLOVE) ×2 IMPLANT
GLOVE BIO SURGEON STRL SZ8 (GLOVE) ×3 IMPLANT
GLOVE BIOGEL PI IND STRL 6.5 (GLOVE) IMPLANT
GLOVE BIOGEL PI IND STRL 7.0 (GLOVE) IMPLANT
GLOVE BIOGEL PI IND STRL 8 (GLOVE) ×2 IMPLANT
GLOVE BIOGEL PI INDICATOR 6.5 (GLOVE) ×2
GLOVE BIOGEL PI INDICATOR 7.0 (GLOVE) ×4
GLOVE BIOGEL PI INDICATOR 8 (GLOVE) ×4
GLOVE ECLIPSE 6.5 STRL STRAW (GLOVE) ×2 IMPLANT
GLOVE ECLIPSE 8.0 STRL XLNG CF (GLOVE) ×3 IMPLANT
GOWN STRL REUS W/ TWL LRG LVL3 (GOWN DISPOSABLE) ×1 IMPLANT
GOWN STRL REUS W/ TWL XL LVL3 (GOWN DISPOSABLE) ×2 IMPLANT
GOWN STRL REUS W/TWL LRG LVL3 (GOWN DISPOSABLE) ×6
GOWN STRL REUS W/TWL XL LVL3 (GOWN DISPOSABLE) ×6
K-WIRE ACE 1.6X6 (WIRE) ×3
KWIRE ACE 1.6X6 (WIRE) IMPLANT
NEEDLE HYPO 22GX1.5 SAFETY (NEEDLE) IMPLANT
NS IRRIG 1000ML POUR BTL (IV SOLUTION) ×3 IMPLANT
PACK BASIN DAY SURGERY FS (CUSTOM PROCEDURE TRAY) ×3 IMPLANT
PAD CAST 4YDX4 CTTN HI CHSV (CAST SUPPLIES) ×1 IMPLANT
PADDING CAST ABS 4INX4YD NS (CAST SUPPLIES) ×2
PADDING CAST ABS COTTON 4X4 ST (CAST SUPPLIES) IMPLANT
PADDING CAST COTTON 4X4 STRL (CAST SUPPLIES) ×3
PADDING CAST COTTON 6X4 STRL (CAST SUPPLIES) ×3 IMPLANT
PENCIL BUTTON HOLSTER BLD 10FT (ELECTRODE) ×3 IMPLANT
PLATE ACE 100DEG 7HOLE (Plate) ×2 IMPLANT
SANITIZER HAND PURELL 535ML FO (MISCELLANEOUS) ×3 IMPLANT
SCREW ACE CAN 4.0 50M (Screw) ×2 IMPLANT
SCREW CORTICAL 3.5MM  20MM (Screw) ×4 IMPLANT
SCREW CORTICAL 3.5MM 18MM (Screw) ×6 IMPLANT
SCREW CORTICAL 3.5MM 20MM (Screw) IMPLANT
SCREW CORTICAL 3.5MM 24MM (Screw) ×4 IMPLANT
SHEET MEDIUM DRAPE 40X70 STRL (DRAPES) ×3 IMPLANT
SLEEVE SCD COMPRESS KNEE MED (MISCELLANEOUS) ×3 IMPLANT
SPLINT FAST PLASTER 5X30 (CAST SUPPLIES) ×40
SPLINT PLASTER CAST FAST 5X30 (CAST SUPPLIES) ×20 IMPLANT
SPONGE LAP 18X18 X RAY DECT (DISPOSABLE) ×3 IMPLANT
STOCKINETTE 6  STRL (DRAPES) ×2
STOCKINETTE 6 STRL (DRAPES) ×1 IMPLANT
SUCTION FRAZIER HANDLE 10FR (MISCELLANEOUS) ×2
SUCTION TUBE FRAZIER 10FR DISP (MISCELLANEOUS) ×1 IMPLANT
SUT ETHILON 3 0 PS 1 (SUTURE) ×3 IMPLANT
SUT FIBERWIRE #2 38 T-5 BLUE (SUTURE)
SUT MNCRL AB 3-0 PS2 18 (SUTURE) IMPLANT
SUT VIC AB 0 SH 27 (SUTURE) IMPLANT
SUT VIC AB 2-0 SH 27 (SUTURE) ×3
SUT VIC AB 2-0 SH 27XBRD (SUTURE) ×1 IMPLANT
SUTURE FIBERWR #2 38 T-5 BLUE (SUTURE) IMPLANT
SYR BULB 3OZ (MISCELLANEOUS) ×3 IMPLANT
SYR CONTROL 10ML LL (SYRINGE) IMPLANT
TOWEL OR 17X24 6PK STRL BLUE (TOWEL DISPOSABLE) ×6 IMPLANT
TUBE CONNECTING 20'X1/4 (TUBING) ×1
TUBE CONNECTING 20X1/4 (TUBING) ×2 IMPLANT
UNDERPAD 30X30 (UNDERPADS AND DIAPERS) ×3 IMPLANT

## 2017-06-06 NOTE — Anesthesia Procedure Notes (Signed)
Anesthesia Regional Block: Adductor canal block   Pre-Anesthetic Checklist: ,, timeout performed, Correct Patient, Correct Site, Correct Laterality, Correct Procedure, Correct Position, site marked, Risks and benefits discussed,  Surgical consent,  Pre-op evaluation,  At surgeon's request and post-op pain management  Laterality: Lower and Right  Prep: chloraprep       Needles:  Injection technique: Single-shot  Needle Type: Echogenic Stimulator Needle          Additional Needles:   Procedures:,,,, ultrasound used (permanent image in chart),,,,  Narrative:  Start time: 06/06/2017 9:08 AM End time: 06/06/2017 9:19 AM Injection made incrementally with aspirations every 5 mL.  Performed by: Personally  Anesthesiologist: Oleta Mouse, MD  Additional Notes: H+P and labs reviewed, risks and benefits discussed with patient, procedure tolerated well without complications

## 2017-06-06 NOTE — Discharge Instructions (Addendum)
Aaron Simmer, MD [facility]  Please read the following information regarding your care after surgery.  Medications  You only need a prescription for the narcotic pain medicine (ex. oxycodone, Percocet, Norco).  All of the other medicines listed below are available over the counter. X Aleve 2 pills twice a day for the first 3 days after surgery. X acetominophen (Tylenol) 650 mg every 4-6 hours as you need for minor to moderate pain X oxycodone as prescribed for severe pain  Narcotic pain medicine (ex. oxycodone, Percocet, Vicodin) will cause constipation.  To prevent this problem, take the following medicines while you are taking any pain medicine. X docusate sodium (Colace) 100 mg twice a day X senna (Senokot) 2 tablets twice a day  X To help prevent blood clots, take a baby aspirin (81 mg) twice a day for two weeks after surgery.  You should also get up every hour while you are awake to move around.    Weight Bearing X Do not bear any weight on the operated leg or foot.  Cast / Splint / Dressing X Keep your splint, cast or dressing clean and dry.  Dont put anything (coat hanger, pencil, etc) down inside of it.  If it gets damp, use a hair dryer on the cool setting to dry it.  If it gets soaked, call the office to schedule an appointment for a cast change.   After your dressing, cast or splint is removed; you may shower, but do not soak or scrub the wound.  Allow the water to run over it, and then gently pat it dry.  Swelling It is normal for you to have swelling where you had surgery.  To reduce swelling and pain, keep your toes above your nose for at least 3 days after surgery.  It may be necessary to keep your foot or leg elevated for several weeks.  If it hurts, it should be elevated.  Follow Up Call my office at [phone] when you are discharged from the hospital or surgery center to schedule an appointment to be seen two weeks after surgery.  Call my office at  [phone] if you develop a fever >101.5 F, nausea, vomiting, bleeding from the surgical site or severe pain.    Regional Anesthesia Blocks  1. Numbness or the inability to move the "blocked" extremity may last from 3-48 hours after placement. The length of time depends on the medication injected and your individual response to the medication. If the numbness is not going away after 48 hours, call your surgeon.  2. The extremity that is blocked will need to be protected until the numbness is gone and the  Strength has returned. Because you cannot feel it, you will need to take extra care to avoid injury. Because it may be weak, you may have difficulty moving it or using it. You may not know what position it is in without looking at it while the block is in effect.  3. For blocks in the legs and feet, returning to weight bearing and walking needs to be done carefully. You will need to wait until the numbness is entirely gone and the strength has returned. You should be able to move your leg and foot normally before you try and bear weight or walk. You will need someone to be with you when you first try to ensure you do not fall and possibly risk injury.  4. Bruising and tenderness at the needle site are common side effects and will  resolve in a few days.  5. Persistent numbness or new problems with movement should be communicated to the surgeon or the [facility] [phone] [place] [phone]).  Post Anesthesia Home Care Instructions  Activity: Get plenty of rest for the remainder of the day. A responsible individual must stay with you for 24 hours following the procedure.  For the next 24 hours, DO NOT: -Drive a car -Paediatric nurse -Drink alcoholic beverages -Take any medication unless instructed by your physician -Make any legal decisions or sign important papers.  Meals: Start with liquid foods such as gelatin or soup. Progress to regular foods  as tolerated. Avoid greasy, spicy, heavy foods. If nausea and/or vomiting occur, drink only clear liquids until the nausea and/or vomiting subsides. Call your physician if vomiting continues.  Special Instructions/Symptoms: Your throat may feel dry or sore from the anesthesia or the breathing tube placed in your throat during surgery. If this causes discomfort, gargle with warm salt water. The discomfort should disappear within 24 hours.  If you had a scopolamine patch placed behind your ear for the management of post- operative nausea and/or vomiting:  1. The medication in the patch is effective for 72 hours, after which it should be removed.  Wrap patch in a tissue and discard in the trash. Wash hands thoroughly with soap and water. 2. You may remove the patch earlier than 72 hours if you experience unpleasant side effects which may include dry mouth, dizziness or visual disturbances. 3. Avoid touching the patch. Wash your hands with soap and water after contact with the patch.

## 2017-06-06 NOTE — Anesthesia Postprocedure Evaluation (Signed)
Anesthesia Post Note  Patient: Faye Ramsay  Procedure(s) Performed: OPEN REDUCTION INTERNAL FIXATION (ORIF) ANKLE FRACTURE (Right Ankle)     Patient location during evaluation: PACU Anesthesia Type: General Level of consciousness: awake and alert Pain management: pain level controlled Vital Signs Assessment: post-procedure vital signs reviewed and stable Respiratory status: spontaneous breathing, nonlabored ventilation and respiratory function stable Cardiovascular status: blood pressure returned to baseline and stable Postop Assessment: no apparent nausea or vomiting Anesthetic complications: no    Last Vitals:  Vitals:   06/06/17 1202 06/06/17 1252  BP: 134/82 132/85  Pulse: 92 94  Resp: 16 18  Temp:  37 C  SpO2: 94% 96%    Last Pain:  Vitals:   06/06/17 1252  TempSrc: Oral  PainSc:                  Catalina Gravel

## 2017-06-06 NOTE — Transfer of Care (Signed)
Immediate Anesthesia Transfer of Care Note  Patient: Aaron Krause  Procedure(s) Performed: OPEN REDUCTION INTERNAL FIXATION (ORIF) ANKLE FRACTURE; possible deltoid ligament repair (Right Ankle)  Patient Location: PACU  Anesthesia Type:GA combined with regional for post-op pain  Level of Consciousness: awake and patient cooperative  Airway & Oxygen Therapy: Patient Spontanous Breathing and Patient connected to face mask oxygen  Post-op Assessment: Report given to RN and Post -op Vital signs reviewed and stable  Post vital signs: Reviewed and stable  Last Vitals:  Vitals:   06/06/17 0920 06/06/17 0925  BP:    Pulse: 76 81  Resp: 16 12  Temp:    SpO2: 99% 98%    Last Pain:  Vitals:   06/06/17 0752  TempSrc: Oral      Patients Stated Pain Goal: 0 (97/91/50 4136)  Complications: No apparent anesthesia complications

## 2017-06-06 NOTE — Anesthesia Procedure Notes (Signed)
Anesthesia Regional Block: Popliteal block   Pre-Anesthetic Checklist: ,, timeout performed, Correct Patient, Correct Site, Correct Laterality, Correct Procedure, Correct Position, site marked, Risks and benefits discussed,  Surgical consent,  Pre-op evaluation,  At surgeon's request and post-op pain management  Laterality: Lower and Right  Prep: chloraprep       Needles:  Injection technique: Single-shot  Needle Type: Echogenic Stimulator Needle          Additional Needles:   Procedures:,,,, ultrasound used (permanent image in chart),,,,  Narrative:  Start time: 06/06/2017 9:08 AM End time: 06/06/2017 9:19 AM Injection made incrementally with aspirations every 5 mL.  Performed by: Personally  Anesthesiologist: Oleta Mouse, MD  Additional Notes: H+P and labs reviewed, risks and benefits discussed with patient, procedure tolerated well without complications

## 2017-06-06 NOTE — Anesthesia Preprocedure Evaluation (Signed)
Anesthesia Evaluation  Patient identified by MRN, date of birth, ID band Patient awake    Reviewed: Allergy & Precautions, NPO status , Patient's Chart, lab work & pertinent test results  History of Anesthesia Complications Negative for: history of anesthetic complications  Airway Mallampati: II  TM Distance: >3 FB Neck ROM: Full    Dental  (+) Missing, Upper Dentures, Poor Dentition,    Pulmonary neg shortness of breath, neg sleep apnea, neg COPD, neg recent URI, former smoker,    breath sounds clear to auscultation       Cardiovascular negative cardio ROS   Rhythm:Regular     Neuro/Psych negative neurological ROS  negative psych ROS   GI/Hepatic negative GI ROS, Neg liver ROS,   Endo/Other  Morbid obesity  Renal/GU negative Renal ROS     Musculoskeletal  (+) Arthritis , Right ankle fx   Abdominal   Peds  Hematology negative hematology ROS (+)   Anesthesia Other Findings   Reproductive/Obstetrics                             Anesthesia Physical Anesthesia Plan  ASA: II  Anesthesia Plan: General   Post-op Pain Management:  Regional for Post-op pain   Induction: Intravenous  PONV Risk Score and Plan: 2 and Dexamethasone, Treatment may vary due to age or medical condition and Ondansetron  Airway Management Planned: LMA  Additional Equipment: None  Intra-op Plan:   Post-operative Plan: Extubation in OR  Informed Consent: I have reviewed the patients History and Physical, chart, labs and discussed the procedure including the risks, benefits and alternatives for the proposed anesthesia with the patient or authorized representative who has indicated his/her understanding and acceptance.   Dental advisory given  Plan Discussed with: CRNA and Surgeon  Anesthesia Plan Comments:         Anesthesia Quick Evaluation

## 2017-06-06 NOTE — H&P (Signed)
Aaron Krause is an 51 y.o. male.   Chief Complaint: right ankle pain HPI: 51 y/o male without significant PMH fell off a dumpster about a week ago injuring his right ankle and right shoulder.  He has a displaced right ankle trimal fracture and possible deltoid ligament rupture.  He presents today for operative treatment of this displaced and unstable right ankle fracture.    Past Medical History:  Diagnosis Date  . Arthritis   . Diverticulitis    with perforation reported  . Ruptured disk   . Trimalleolar fracture of right ankle     Past Surgical History:  Procedure Laterality Date  . ABDOMINAL SURGERY     diverticulitis perforation  . TOTAL HIP ARTHROPLASTY      History reviewed. No pertinent family history. Social History:  reports that he quit smoking about 7 years ago. His smoking use included cigarettes. He started smoking about 23 years ago. He smoked 1.00 pack per day. he has never used smokeless tobacco. He reports that he does not drink alcohol or use drugs.  Allergies: No Known Allergies  Medications Prior to Admission  Medication Sig Dispense Refill  . acetaminophen (TYLENOL) 500 MG tablet Take 1,000 mg by mouth every 6 (six) hours as needed for pain.    Marland Kitchen senna (SENOKOT) 8.6 MG tablet Take 2 tablets by mouth 2 (two) times daily.    . cyclobenzaprine (FLEXERIL) 10 MG tablet Take 1 tablet (10 mg total) by mouth 3 (three) times daily as needed for muscle spasms. 90 tablet 1  . ibuprofen (ADVIL,MOTRIN) 200 MG tablet Take 200 mg by mouth every 6 (six) hours as needed for fever or mild pain.    Marland Kitchen oxyCODONE-acetaminophen (PERCOCET/ROXICET) 5-325 MG per tablet Take 2 tablets by mouth once. (Patient not taking: Reported on 05/20/2017) 20 tablet 0  . oxyCODONE-acetaminophen (PERCOCET/ROXICET) 5-325 MG tablet Take 1-2 tablets by mouth every 6 (six) hours as needed for moderate pain or severe pain. 15 tablet 0    No results found for this or any previous visit (from the past 48  hour(s)). No results found.  ROS  No recent f/c/n/v/wt loss  Pulse 81, temperature (!) 97.5 F (36.4 C), temperature source Oral, resp. rate 14, height 5\' 11"  (1.803 m), weight 124.3 kg (274 lb), SpO2 100 %. Physical Exam  wn wd male in nad.  A and O x 4.  Mood and affect normal.  EOMI.  resp unlabored.  R ankle with intact and moderate swelling.  No lymphadenopathy.  Brisk cap refill at the toes.  Sens to LT intact dorsally and plantarly.    Assessment/Plan R ankle trimal fracture and possible deltoid ligament rupture - to OR for ORIF and possible ligament repair. The risks and benefits of the alternative treatment options have been discussed in detail.  The patient wishes to proceed with surgery and specifically understands risks of bleeding, infection, nerve damage, blood clots, need for additional surgery, amputation and death.   Wylene Simmer, MD June 10, 2017, 9:13 AM

## 2017-06-06 NOTE — Progress Notes (Signed)
Assisted Dr. Moser with right, ultrasound guided, popliteal/saphenous block. Side rails up, monitors on throughout procedure. See vital signs in flow sheet. Tolerated Procedure well. 

## 2017-06-06 NOTE — Anesthesia Procedure Notes (Signed)
Procedure Name: LMA Insertion Date/Time: 06/06/2017 9:35 AM Performed by: Signe Colt, CRNA Pre-anesthesia Checklist: Patient identified, Emergency Drugs available, Suction available and Patient being monitored Patient Re-evaluated:Patient Re-evaluated prior to induction Oxygen Delivery Method: Circle system utilized Preoxygenation: Pre-oxygenation with 100% oxygen Induction Type: IV induction Ventilation: Mask ventilation without difficulty LMA: LMA inserted LMA Size: 5.0 Number of attempts: 1 Airway Equipment and Method: Bite block Placement Confirmation: positive ETCO2 Tube secured with: Tape Dental Injury: Teeth and Oropharynx as per pre-operative assessment

## 2017-06-06 NOTE — Op Note (Signed)
06/06/2017  11:04 AM  PATIENT:  Aaron Krause  51 y.o. male  PRE-OPERATIVE DIAGNOSIS:  Right ankle trimalleolar fracture  POST-OPERATIVE DIAGNOSIS:  Right ankle trimalleolar fracture  Procedure(s): 1.  Open treatment of right ankle trimalleolar fracture with internal fixation and fixation of the posterior lip 2.  Stress exam of right ankle under fluoro 3.  AP, mortise and lateral xrays of the right ankle  SURGEON:  Wylene Simmer, MD  ASSISTANT: Mechele Claude, PA-C  ANESTHESIA:   General, regional  EBL:  minimal   TOURNIQUET:   Total Tourniquet Time Documented: Thigh (Right) - 61 minutes Total: Thigh (Right) - 61 minutes  COMPLICATIONS:  None apparent  DISPOSITION:  Extubated, awake and stable to recovery.  INDICATION FOR PROCEDURE: The patient is a 51 year old male without significant past medical history.  He was at work just over a week ago when he fell off a dumpster injuring his right shoulder and right ankle.  The right ankle has a trimalleolar fracture dislocation.  He presents now for operative treatment of this unstable and displaced fracture.The risks and benefits of the alternative treatment options have been discussed in detail.  The patient wishes to proceed with surgery and specifically understands risks of bleeding, infection, nerve damage, blood clots, need for additional surgery, amputation and death.  PROCEDURE IN DETAIL:After pre operative consent was obtained, and the correct operative site was identified, the patient was brought to the operating room and placed supine on the OR table.  Anesthesia was administered.  Pre-operative antibiotics were administered.  A surgical timeout was taken.  The right lower extremity was then prepped and draped in standard sterile fashion with a tourniquet around the thigh.  The extremity was exsanguinated and the tourniquet was inflated to 250 mmHg.  A longitudinal incision was then made over the lateral malleolus.  Sharp dissection  was carried down through the skin and subtendinous tissues to the fracture site.  The fracture was mobilized and cleaned of all hematoma and irrigated copiously.  Dissection was then carried down to the posterior malleolus fragment.  This was mobilized with an elevator.  It was reduced with a Weber tenaculum.  A stab incision was made at the anterior ankle.  A K wire was placed across the fracture line from anterior to posterior at the level of the fascial scar.  Lateral and AP radiographs showed acceptable alignment of the posterior malleolus fracture fragment.  Fracture was then secured with a 4 mm partially threaded cannulated screw from the [employer] small frag set.  The fibula fracture was then reduced and held with a lobster claw and a pointed tenaculum.  A 3.5 mm fully threaded lag screw was inserted from posterior to anterior and was noted to compress the fracture site appropriately.  A 7 hole one third tubular plate was then contoured to fit the lateral malleolus.  It was secured distally with 3 unit cortical screws and proximally with 3 bicortical screws.  Final AP, mortise and lateral radiographs were obtained showing appropriate reduction of the fractures in appropriate position and length of all hardware.  The avulsion fracture at the medial malleolus was noted to be appropriately reduced.  Stress examination of the ankle was performed under live fluoroscopy.  With flexion and external rotation stress was applied with no evident widening of the medial clear space, syndesmosis or ankle mortise.  The lateral wound was irrigated copiously.  Deep subtendinous tissues were approximated with inverted simple sutures of 2-0 Vicryl.  Skin incision was closed  with a running 3-0 nylon.  The anterior incision was closed with a horizontal mattress suture of 3-0 nylon.  Sterile dressings were applied followed by well-padded short leg splint.  Patient was awakened from anesthesia and transported to the recovery  room in stable condition.   FOLLOW UP PLAN: Nonweightbearing on the right lower extremity.  Follow-up with me in the office in 2 weeks for suture removal and conversion to a Cam Walker boot for early range of motion and weightbearing.  Aspirin 81 mg po BID for DVT prophylaxis.   RADIOGRAPHS: AP, mortise and lateral radiographs of the ankle are obtained intraoperatively.  These show interval reduction and fixation of the trimalleolar ankle fracture.  Hardware is appropriate position and of the appropriate lengths.  No other acute injuries are noted.    Mechele Claude PA-C was present and scrubbed for the duration of the operative case. His assistance assistance was essential in positioning the patient, prepping and draping, gaining maintaining exposure, performing the operation, closing and dressing the wounds and applying the splint.

## 2017-06-07 ENCOUNTER — Encounter (HOSPITAL_BASED_OUTPATIENT_CLINIC_OR_DEPARTMENT_OTHER): Payer: Self-pay | Admitting: Orthopedic Surgery

## 2017-06-15 ENCOUNTER — Encounter (HOSPITAL_COMMUNITY): Payer: Self-pay | Admitting: Emergency Medicine

## 2017-06-15 ENCOUNTER — Emergency Department (HOSPITAL_COMMUNITY): Payer: BLUE CROSS/BLUE SHIELD

## 2017-06-15 ENCOUNTER — Other Ambulatory Visit: Payer: Self-pay

## 2017-06-15 ENCOUNTER — Emergency Department (HOSPITAL_COMMUNITY)
Admission: EM | Admit: 2017-06-15 | Discharge: 2017-06-15 | Disposition: A | Discharge status: Home / Self Care | Payer: BLUE CROSS/BLUE SHIELD | Attending: Emergency Medicine | Admitting: Emergency Medicine

## 2017-06-15 DIAGNOSIS — Z87891 Personal history of nicotine dependence: Secondary | ICD-10-CM | POA: Diagnosis not present

## 2017-06-15 DIAGNOSIS — J181 Lobar pneumonia, unspecified organism: Secondary | ICD-10-CM | POA: Diagnosis not present

## 2017-06-15 DIAGNOSIS — Z96649 Presence of unspecified artificial hip joint: Secondary | ICD-10-CM | POA: Insufficient documentation

## 2017-06-15 DIAGNOSIS — Z7982 Long term (current) use of aspirin: Secondary | ICD-10-CM | POA: Insufficient documentation

## 2017-06-15 DIAGNOSIS — R079 Chest pain, unspecified: Secondary | ICD-10-CM | POA: Diagnosis present

## 2017-06-15 DIAGNOSIS — J189 Pneumonia, unspecified organism: Secondary | ICD-10-CM

## 2017-06-15 LAB — BASIC METABOLIC PANEL
Anion gap: 12 (ref 5–15)
BUN: 10 mg/dL (ref 6–20)
CHLORIDE: 100 mmol/L — AB (ref 101–111)
CO2: 19 mmol/L — ABNORMAL LOW (ref 22–32)
CREATININE: 0.99 mg/dL (ref 0.61–1.24)
Calcium: 9.5 mg/dL (ref 8.9–10.3)
GFR calc Af Amer: >60 mL/min (ref >60)
GLUCOSE: 86 mg/dL (ref 65–99)
POTASSIUM: 4.1 mmol/L (ref 3.5–5.1)
Sodium: 131 mmol/L — ABNORMAL LOW (ref 135–145)

## 2017-06-15 LAB — CBC
HEMATOCRIT: 43.7 % (ref 39.0–52.0)
Hemoglobin: 15 g/dL (ref 13.0–17.0)
MCH: 31.6 pg (ref 26.0–34.0)
MCHC: 34.3 g/dL (ref 30.0–36.0)
MCV: 92.2 fL (ref 78.0–100.0)
Platelets: 288 10*3/uL (ref 150–400)
RBC: 4.74 MIL/uL (ref 4.22–5.81)
RDW: 12.5 % (ref 11.5–15.5)
WBC: 16.3 10*3/uL — ABNORMAL HIGH (ref 4.0–10.5)

## 2017-06-15 LAB — I-STAT CG4 LACTIC ACID, ED
Lactic Acid, Venous: 1.36 mmol/L (ref 0.5–1.9)
Lactic Acid, Venous: 1.29 mmol/L (ref 0.5–1.9)

## 2017-06-15 LAB — I-STAT TROPONIN, ED: Troponin i, poc: 0.02 ng/mL (ref 0.00–0.08)

## 2017-06-15 LAB — D-DIMER, QUANTITATIVE: D-Dimer, Quant: 2.37 ug/mL-FEU — ABNORMAL HIGH (ref 0.00–0.50)

## 2017-06-15 MED ORDER — FENTANYL CITRATE (PF) 100 MCG/2ML IJ SOLN
100.0000 ug | Freq: Once | INTRAMUSCULAR | Status: AC
Start: 2017-06-15 — End: 2017-06-15
  Administered 2017-06-15: 100 ug via INTRAVENOUS
  Filled 2017-06-15: qty 2

## 2017-06-15 MED ORDER — FENTANYL CITRATE (PF) 100 MCG/2ML IJ SOLN
50.0000 ug | Freq: Once | INTRAMUSCULAR | Status: AC
  Administered 2017-06-15: 50 ug via INTRAVENOUS
  Filled 2017-06-15: qty 2

## 2017-06-15 MED ORDER — IOPAMIDOL (ISOVUE-370) INJECTION 76%
INTRAVENOUS | Status: AC
  Administered 2017-06-15: 80 mL
  Filled 2017-06-15: qty 100

## 2017-06-15 MED ORDER — AMOXICILLIN-POT CLAVULANATE 875-125 MG PO TABS: 1.0000 | ORAL_TABLET | Freq: Two times a day (BID) | ORAL | 0 refills | Status: AC

## 2017-06-15 MED ORDER — KETOROLAC TROMETHAMINE 30 MG/ML IJ SOLN
30.0000 mg | Freq: Once | INTRAMUSCULAR | Status: AC
  Administered 2017-06-15: 30 mg via INTRAVENOUS
  Filled 2017-06-15: qty 1

## 2017-06-15 NOTE — ED Notes (Signed)
Patient transported to X-ray 

## 2017-06-15 NOTE — ED Triage Notes (Signed)
Pt reports SOB, worsewith exertion, and new R lateral CP starting Wednesday. Pt reports being 9 days post-op from R ankle surgery.  Pt sent from [facility] for PE rule out.  Pt tachypnic, tachy in triage.

## 2017-06-15 NOTE — ED Provider Notes (Signed)
[facility] EMERGENCY DEPARTMENT Provider Note   CSN: [id number] Arrival date & time: 06/15/17  1354     History   Chief Complaint Chief Complaint  Patient presents with  . Shortness of Breath  . Chest Pain    HPI Aaron Krause is a 51 y.o. male.  HPI Patient presents with chest pain.  Sent from urgent care.  Had recent surgery on his right ankle for a fracture.  Had chest pain that started 3 days ago.  Worse with exertion and movement.  Occasional cough.  Denies family members with cough.  Low-grade fevers for him.  Pain is much worse with movement and coughing.  Also has pain in his right shoulder but states that is from his fall where he hurt his rotator cuff.  He has been coughing up blood. Past Medical History:  Diagnosis Date  . Arthritis   . Diverticulitis    with perforation reported  . Ruptured disk   . Trimalleolar fracture of right ankle     Patient Active Problem List   Diagnosis Date Noted  . Diverticulitis of colon 05/03/2015    Past Surgical History:  Procedure Laterality Date  . ABDOMINAL SURGERY     diverticulitis perforation  . OPEN REDUCTION INTERNAL FIXATION (ORIF) ANKLE FRACTURE Right 06/06/2017   Performed by Wylene Simmer, MD at [facility]  . TOTAL HIP ARTHROPLASTY         Home Medications    Prior to Admission medications   Medication Sig Start Date End Date Taking? Authorizing Provider  acetaminophen (TYLENOL) 500 MG tablet Take 1,000 mg by mouth every 6 (six) hours as needed for pain.   Yes [provider]  aspirin EC 81 MG tablet Take 1 tablet (81 mg total) 2 (two) times daily by mouth. 06/06/17  Yes Corky Sing, PA-C  cyclobenzaprine (FLEXERIL) 10 MG tablet Take 1 tablet (10 mg total) by mouth 3 (three) times daily as needed for muscle spasms. 03/18/15  Yes Claretta Fraise, MD  docusate sodium (COLACE) 100 MG capsule Take 1 capsule (100 mg total) 2 (two) times daily by mouth. While taking  narcotic pain medicine. 06/06/17  Yes Corky Sing, PA-C  ibuprofen (ADVIL,MOTRIN) 200 MG tablet Take 400 mg every 6 (six) hours as needed by mouth for fever or mild pain.    Yes [provider]  oxyCODONE (ROXICODONE) 5 MG immediate release tablet Take 1-2 tablets (5-10 mg total) every 4 (four) hours as needed by mouth for moderate pain or severe pain. 06/06/17  Yes Corky Sing, PA-C  amoxicillin-clavulanate (AUGMENTIN) 875-125 MG tablet Take 1 tablet every 12 (twelve) hours by mouth. 06/15/17   Davonna Belling, MD  senna (SENOKOT) 8.6 MG TABS tablet Take 2 tablets (17.2 mg total) 2 (two) times daily by mouth. Patient not taking: Reported on 06/15/2017 06/06/17   Corky Sing, PA-C    Family History No family history on file.  Social History Social History   Tobacco Use  . Smoking status: Former Smoker    Packs/day: 1.00    Types: Cigarettes    Start date: 09/17/1993    Last attempt to quit: 09/17/2009    Years since quitting: 7.7  . Smokeless tobacco: Never Used  Substance Use Topics  . Alcohol use: No    Alcohol/week: 0.0 oz  . Drug use: No     Allergies   Patient has no known allergies.   Review of Systems Review of Systems  Constitutional: Positive for fever. Negative for appetite change.  HENT: Negative for congestion.   Respiratory: Positive for cough and shortness of breath. Negative for wheezing.   Cardiovascular: Negative for chest pain.  Gastrointestinal: Negative for abdominal pain.  Genitourinary: Negative for flank pain.  Musculoskeletal: Negative for back pain.  Neurological: Negative for weakness.  Psychiatric/Behavioral: Negative for confusion.     Physical Exam Updated Vital Signs BP 119/71   Pulse (!) 102   Temp (!) 100.6 F (38.1 C) (Oral)   Resp 17   SpO2 95%   Physical Exam  Constitutional: He appears well-developed.  HENT:  Head: Normocephalic.  Eyes: Pupils are equal, round, and reactive to light.  Neck:  Neck supple.  Cardiovascular:  Tachycardia  Pulmonary/Chest:  Rare scattered wheeze.  Moderate to severe tenderness right lateral lower chest wall.  No crepitance.  No subcu emphysema.  No rash.  Abdominal: Soft.  Musculoskeletal:  Right lower leg and ankle in cast.  Neurological: He is alert.  Skin: Skin is warm. Capillary refill takes less than 2 seconds.  Psychiatric: He has a normal mood and affect.     ED Treatments / Results  Labs (all labs ordered are listed, but only abnormal results are displayed) Labs Reviewed  BASIC METABOLIC PANEL - Abnormal; Notable for the following components:      Result Value   Sodium 131 (*)    Chloride 100 (*)    CO2 19 (*)    All other components within normal limits  CBC - Abnormal; Notable for the following components:   WBC 16.3 (*)    All other components within normal limits  D-DIMER, QUANTITATIVE (NOT AT [facility]) - Abnormal; Notable for the following components:   D-Dimer, Quant 2.37 (*)    All other components within normal limits  I-STAT TROPONIN, ED  I-STAT CG4 LACTIC ACID, ED  I-STAT CG4 LACTIC ACID, ED    EKG  EKG Interpretation  Date/Time:  Saturday June 15 2017 14:14:56 EST Ventricular Rate:  125 PR Interval:  152 QRS Duration: 82 QT Interval:  284 QTC Calculation: 409 R Axis:   86 Text Interpretation:  Sinus tachycardia Otherwise normal ECG Confirmed by Pattricia Boss [phone]) on 06/15/2017 2:36:15 PM       Radiology Dg Chest 2 View  Result Date: 06/15/2017 CLINICAL DATA:  Shortness of Breath EXAM: CHEST  2 VIEW COMPARISON:  05/20/17 FINDINGS: Cardiac shadow is within normal limits. Lungs are well aerated bilaterally with evidence of very minimal effusions posteriorly as well as some mild bibasilar atelectasis. No bony abnormality is seen. IMPRESSION: Bibasilar atelectatic changes and small effusions posteriorly. Electronically Signed   By: Inez Catalina M.D.   On: 06/15/2017 14:43   Ct Angio Chest Pe W And/or Wo  Contrast  Result Date: 06/15/2017 CLINICAL DATA:  Recent fall, chest pain with deep inspiration since Thursday. PE suspected. High pretest probability for PE. EXAM: CT ANGIOGRAPHY CHEST WITH CONTRAST TECHNIQUE: Multidetector CT imaging of the chest was performed using the standard protocol during bolus administration of intravenous contrast. Multiplanar CT image reconstructions and MIPs were obtained to evaluate the vascular anatomy. CONTRAST:  43mL ISOVUE-370 IOPAMIDOL (ISOVUE-370) INJECTION 76% COMPARISON:  None. FINDINGS: Cardiovascular: Some of the most peripheral segmental and subsegmental pulmonary arteries are difficult to definitively characterize due to patient breathing motion artifact, however, there is no pulmonary embolism identified within the main, lobar or central segmental pulmonary arteries bilaterally. Thoracic aorta is normal in caliber and configuration. No aortic aneurysm or evidence  of aortic dissection. Heart size is within normal limits. No pericardial effusion. Mediastinum/Nodes: Esophagus appears normal. Scattered small lymph nodes within the mediastinum and perihilar regions. No mass or enlarged lymph nodes identified. Trachea and central bronchi are unremarkable. Lungs/Pleura: Patchy consolidations within the right lower lobe, pneumonia versus atelectasis. Small right pleural effusion. No pneumothorax. Upper Abdomen: No acute findings. Musculoskeletal: Mild degenerative spurring within the thoracic spine. No osseous fracture or dislocation seen. Review of the MIP images confirms the above findings. IMPRESSION: 1. Patchy consolidations within the right lower lobe, pneumonia versus atelectasis. If afebrile, most likely atelectasis. 2. Small right pleural effusion. 3. No pulmonary embolism seen, with mild study limitations detailed above. 4. No aortic aneurysm or evidence of aortic dissection. 5. Heart size is normal.  No pericardial effusion. 6. No osseous fracture or dislocation seen.  Electronically Signed   By: Franki Cabot M.D.   On: 06/15/2017 16:38    Procedures Procedures (including critical care time)  Medications Ordered in ED Medications  fentaNYL (SUBLIMAZE) injection 50 mcg (50 mcg Intravenous Given 06/15/17 1526)  iopamidol (ISOVUE-370) 76 % injection (80 mLs  Contrast Given 06/15/17 1604)  fentaNYL (SUBLIMAZE) injection 100 mcg (100 mcg Intravenous Given 06/15/17 1625)  ketorolac (TORADOL) 30 MG/ML injection 30 mg (30 mg Intravenous Given 06/15/17 1744)     Initial Impression / Assessment and Plan / ED Course  I have reviewed the triage vital signs and the nursing notes.  Pertinent labs & imaging results that were available during my care of the patient were reviewed by me and considered in my medical decision making (see chart for details).     Patient with shortness of breath and chest pain with recent ankle surgery.  CT scan done and showed no pulmonary embolism.  It did however show a possible pneumonia.  Has low-grade fever and elevated white count.  Will treat with antibiotics.  Heart rate is come down and pain improved.  Has pain medicine at home.  Will discharge home.  Final Clinical Impressions(s) / ED Diagnoses   Final diagnoses:  Community acquired pneumonia of right lower lobe of lung [facility])    ED Discharge Orders        Ordered    amoxicillin-clavulanate (AUGMENTIN) 875-125 MG tablet  Every 12 hours     06/15/17 1819       Davonna Belling, MD 06/15/17 1954

## 2017-06-15 NOTE — ED Triage Notes (Signed)
Pt reports family members sick with URI, fevers.

## 2017-08-05 ENCOUNTER — Ambulatory Visit: Payer: Worker's Compensation | Attending: Specialist | Admitting: Physical Therapy

## 2017-08-05 DIAGNOSIS — M25511 Pain in right shoulder: Secondary | ICD-10-CM | POA: Diagnosis not present

## 2017-08-05 DIAGNOSIS — M25651 Stiffness of right hip, not elsewhere classified: Secondary | ICD-10-CM | POA: Diagnosis present

## 2017-08-05 DIAGNOSIS — R269 Unspecified abnormalities of gait and mobility: Secondary | ICD-10-CM | POA: Diagnosis present

## 2017-08-05 DIAGNOSIS — R262 Difficulty in walking, not elsewhere classified: Secondary | ICD-10-CM | POA: Diagnosis present

## 2017-08-05 DIAGNOSIS — M25611 Stiffness of right shoulder, not elsewhere classified: Secondary | ICD-10-CM

## 2017-08-05 DIAGNOSIS — M25571 Pain in right ankle and joints of right foot: Secondary | ICD-10-CM

## 2017-08-05 NOTE — Therapy (Signed)
[facility] [place], [place], [address] Phone: [phone]   Fax:  [phone]  Physical Therapy Evaluation  Patient Details  Name: Aaron Krause MRN: [id number] Date of Birth: 1965-08-02 No Data Recorded  Encounter Date: 08/05/2017  PT End of Session - 08/05/17 0936    Visit Number  1    Number of Visits  12    Date for PT Re-Evaluation  09/06/17    Authorization Type  Pt has 12 visits approved from workers comp. Will need to reapply for further visits.     Authorization Time Period  1 month    Authorization - Visit Number  1    Authorization - Number of Visits  12    PT Start Time  0900    PT Stop Time  0955    PT Time Calculation (min)  55 min    Activity Tolerance  Patient tolerated treatment well    Behavior During Therapy  WFL for tasks assessed/performed       Past Medical History:  Diagnosis Date  . Arthritis   . Diverticulitis    with perforation reported  . Ruptured disk   . Trimalleolar fracture of right ankle     Past Surgical History:  Procedure Laterality Date  . ABDOMINAL SURGERY     diverticulitis perforation  . ORIF ANKLE FRACTURE Right 06/06/2017   Procedure: OPEN REDUCTION INTERNAL FIXATION (ORIF) ANKLE FRACTURE;  Surgeon: Wylene Simmer, MD;  Location: [facility];  Service: Orthopedics;  Laterality: Right;  . TOTAL HIP ARTHROPLASTY      There were no vitals filed for this visit.   Subjective Assessment - 08/06/17 0919    Subjective  Pt arriving to therpay s/p fall from dumpster at work on May 20, 2017. Pt s/p trimalleolar fx/dislocation of R ankle with surgery on 06/06/17. Pt also with R rotator cuffer traumatic tear s/p repair on 07/15/17.    Pertinent History  s/p right trimalleolar fx s/p surgery on 06/06/17, s/p R rotator cuff repair on 07/15/17.     Patient Stated Goals  get back to work    Currently in Pain?  Yes    Pain Score  5     Pain Location  Shoulder    Pain  Orientation  Right    Pain Descriptors / Indicators  Sore;Discomfort;Aching    Pain Type  Surgical pain    Pain Onset  More than a month ago    Pain Frequency  Constant    Aggravating Factors   movement    Pain Relieving Factors  resting, meds    Effect of Pain on Daily Activities  difficulty sleeping, unable to perform ADL's and unable to work                 Objective measurements completed on examination: See above findings.              PT Education - 08/05/17 0935    Education provided  Yes    Education Details  HEP    Person(s) Educated  Patient    Methods  Explanation;Demonstration;Verbal cues;Handout    Comprehension  Verbalized understanding;Returned demonstration          PT Long Term Goals - 08/06/17 1409      PT LONG TERM GOAL #1   Title  Pt will be independent with a HEP and progression.     Time  8    Period  Weeks  Status  New    Target Date  10/01/17      PT LONG TERM GOAL #2   Title  Pt will improve his R SLS to >/= 30 seconds to improve balance and functional mobility.     Time  8    Period  Weeks    Status  New    Target Date  10/01/17      PT LONG TERM GOAL #3   Title  Walk with normal gait pattern.    Time  8    Period  Weeks    Status  New    Target Date  10/01/17      PT LONG TERM GOAL #4   Title  Pt will improve his R shoulder flexion to >/= 150 degrees.     Time  8    Period  Weeks    Status  New    Target Date  10/01/17      PT LONG TERM GOAL #5   Title  Pt will improve his R grip strength to >/= 40 ppsi.     Time  8    Period  Weeks    Status  New    Target Date  10/01/17             Plan - 08/05/17 1610    Clinical Impression Statement  Pt presenting today s/p fall from dumpster at work. Pt s/p R trimalleolar fx/dislocation and R rotator cuff repair. Pt was NWB on R LE for 2 weeks and then placed in a CAM walker for 4 weeks. Pt arriving to therapy amb with mild antalgic gait with decreaseed  weight bearing on R LE. Pt arriving wearing no sling on his R UE. and reported doing elbow flexion, pendulums and "chicken wings"/shoulder abduction with elbow flexed. Pt was issued a HEP for his R ankle and R shoulder. Pt reporting feeling more limited with his shoulder  with increased pain, weakness and decreased ROM. Pt with decreased ROM and gait diffictulites in his R LE. Skilled PT needed to progress pt toward his PLOF.     Clinical Presentation  Stable    Clinical Presentation due to:  Multiple joint surgeries    Clinical Decision Making  Moderate    Rehab Potential  Good    Clinical Impairments Affecting Rehab Potential  only approved for 12 visits, due to multiple injuries pt may require additional visits    PT Frequency  3x / week    PT Duration  4 weeks    PT Treatment/Interventions  ADLs/Self Care Home Management;Cryotherapy;Electrical Stimulation;Therapeutic exercise;Therapeutic activities;Functional mobility training;Stair training;Gait training;Ultrasound;Balance training;Neuromuscular re-education;Patient/family education;Manual techniques;Passive range of motion;Taping    PT Next Visit Plan  RTC repair protocol, Ankle ROM/strengthening, gait training    PT Home Exercise Plan  shoulder flexion supine with cane, pendulums, table slides, shoulder shrugs, towel scrunches, Inv/Ever of ankle    Consulted and Agree with Plan of Care  Patient       Patient will benefit from skilled therapeutic intervention in order to improve the following deficits and impairments:  Abnormal gait, Pain, Increased edema, Decreased activity tolerance, Decreased strength, Decreased balance, Decreased mobility, Difficulty walking, Impaired UE functional use, Decreased range of motion  Visit Diagnosis: Acute pain of right shoulder  Stiffness of right shoulder, not elsewhere classified  Pain in right ankle and joints of right foot  Difficulty in walking, not elsewhere classified     Problem  List Patient Active Problem List  Diagnosis Date Noted  . Diverticulitis of colon 05/03/2015    Oretha Caprice, MPT 08/06/2017, 2:23 PM  [facility] [place], [place], [address] Phone: [phone]   Fax:  [phone]  Name: Aaron Krause MRN: [id number] Date of Birth: 1966-06-16

## 2017-08-06 ENCOUNTER — Encounter: Payer: Self-pay | Admitting: Physical Therapy

## 2017-08-07 ENCOUNTER — Ambulatory Visit: Payer: Worker's Compensation | Admitting: Physical Therapy

## 2017-08-07 ENCOUNTER — Encounter: Payer: Self-pay | Admitting: Physical Therapy

## 2017-08-07 DIAGNOSIS — M25611 Stiffness of right shoulder, not elsewhere classified: Secondary | ICD-10-CM

## 2017-08-07 DIAGNOSIS — R262 Difficulty in walking, not elsewhere classified: Secondary | ICD-10-CM

## 2017-08-07 DIAGNOSIS — M25571 Pain in right ankle and joints of right foot: Secondary | ICD-10-CM

## 2017-08-07 DIAGNOSIS — M25511 Pain in right shoulder: Secondary | ICD-10-CM | POA: Diagnosis not present

## 2017-08-07 NOTE — Therapy (Signed)
[facility] [place], [place], [address] Phone: [phone]   Fax:  [phone]  Physical Therapy Treatment  Patient Details  Name: Aaron Krause MRN: [id number] Date of Birth: [birth year] Referring Provider: Susa Day   Encounter Date: 08/07/2017  PT End of Session - 08/07/17 1345    Visit Number  2    Number of Visits  12    Date for PT Re-Evaluation  09/06/17    Authorization Type  Pt has 12 visits approved from workers comp. Will need to reapply for further visits.     Authorization Time Period  1 month    Authorization - Visit Number  2    Authorization - Number of Visits  12    PT Start Time  0945    PT Stop Time  1040    PT Time Calculation (min)  55 min    Activity Tolerance  Treatment limited secondary to medical complications (Comment)    Behavior During Therapy  [facility] for tasks assessed/performed       Past Medical History:  Diagnosis Date  . Arthritis   . Diverticulitis    with perforation reported  . Ruptured disk   . Trimalleolar fracture of right ankle     Past Surgical History:  Procedure Laterality Date  . ABDOMINAL SURGERY     diverticulitis perforation  . ORIF ANKLE FRACTURE Right 06/06/2017   Procedure: OPEN REDUCTION INTERNAL FIXATION (ORIF) ANKLE FRACTURE;  Surgeon: Wylene Simmer, MD;  Location: [place];  Service: Orthopedics;  Laterality: Right;  . TOTAL HIP ARTHROPLASTY      There were no vitals filed for this visit.  Subjective Assessment - 08/07/17 1252    Subjective  Pt arrving today reporting 2/10 pain in his R shoulder and R ankle.     Pertinent History  s/p right trimalleolar fx s/p surgery on 06/06/17, s/p R rotator cuff repair on 07/15/17.     Patient Stated Goals  get back to work    Currently in Pain?  Yes    Pain Score  2     Pain Location  Ankle    Pain Orientation  Right    Pain Descriptors / Indicators  Aching;Sore;Tightness    Pain Type  Surgical  pain    Pain Onset  More than a month ago    Pain Frequency  Constant    Multiple Pain Sites  Yes    Pain Score  2    Pain Location  Shoulder    Pain Orientation  Right    Pain Descriptors / Indicators  Aching;Sore;Throbbing    Pain Type  Surgical pain    Pain Onset  1 to 4 weeks ago    Pain Frequency  Constant    Aggravating Factors   movements    Effect of Pain on Daily Activities  unable to perform ADL's and difficulty sleeping         [facility] PT Assessment - 08/07/17 0001      Assessment   Medical Diagnosis  Trimalleolar Fx/dislocation right ankle, R rotator cuffer replair    Referring Provider  Susa Day    Onset Date/Surgical Date  -- 06/06/17 Ankle Surgery, 07/15/17 Rotator cuff repair    Hand Dominance  Right    Prior Therapy  yes in past for back      Precautions   Precautions  Shoulder  [facility] Adult PT Treatment/Exercise - 08/07/17 0001      Exercises   Exercises  Shoulder;Ankle      Shoulder Exercises: Supine   Flexion  AAROM;15 reps    ABduction  AAROM;15 reps      Modalities   Modalities  Electrical Stimulation;Vasopneumatic      Electrical Stimulation   Electrical Stimulation Location  R shoulder and R ankle    Electrical Stimulation Action  IFC    Electrical Stimulation Parameters  80-150 Hz    Electrical Stimulation Goals  Pain      Vasopneumatic   Number Minutes Vasopneumatic   15 minutes    Vasopnuematic Location   Shoulder R ankle    Vasopneumatic Pressure  Low    Vasopneumatic Temperature   40      Manual Therapy   Manual Therapy  Passive ROM    Manual therapy comments  R shoulder, flexion/abd      Ankle Exercises: Stretches   Gastroc Stretch  3 reps;30 seconds      Ankle Exercises: Aerobic   Stationary Bike  nustep no UE support level 4      Ankle Exercises: Supine   Other Supine Ankle Exercises  Red theraband, DF/PF/Eversion/Inversion 10 reps x 2 sets,              PT Education - 08/07/17 1343     Education provided  Yes    Education Details  Reviewed verbally pt's HEP    Person(s) Educated  Patient    Methods  Explanation    Comprehension  Verbalized understanding          PT Long Term Goals - 08/06/17 1409      PT LONG TERM GOAL #1   Title  Pt will be independent with a HEP and progression.     Time  8    Period  Weeks    Status  New    Target Date  10/01/17      PT LONG TERM GOAL #2   Title  Pt will improve his R SLS to >/= 30 seconds to improve balance and functional mobility.     Time  8    Period  Weeks    Status  New    Target Date  10/01/17      PT LONG TERM GOAL #3   Title  Walk with normal gait pattern.    Time  8    Period  Weeks    Status  New    Target Date  10/01/17      PT LONG TERM GOAL #4   Title  Pt will improve his R shoulder flexion to >/= 150 degrees.     Time  8    Period  Weeks    Status  New    Target Date  10/01/17      PT LONG TERM GOAL #5   Title  Pt will improve his R grip strength to >/= 40 ppsi.     Time  8    Period  Weeks    Status  New    Target Date  10/01/17            Plan - 08/07/17 1346    Clinical Impression Statement  Pt presenting today complaining of 2/10 pain in R shoulder and R ankle. Pt reported doing his HEP with soreness afterward, but reported no difficulties. HEP reviewed verbally. Pt tolerating exercises well today. continue with skilled PT to progress  toward pt's PLOF and return to work.     Rehab Potential  Good    Clinical Impairments Affecting Rehab Potential  only approved for 12 visits, due to multiple injuries pt may require additional visits    PT Frequency  3x / week    PT Duration  4 weeks    PT Treatment/Interventions  ADLs/Self Care Home Management;Cryotherapy;Electrical Stimulation;Therapeutic exercise;Therapeutic activities;Functional mobility training;Stair training;Gait training;Ultrasound;Balance training;Neuromuscular re-education;Patient/family education;Manual  techniques;Passive range of motion;Taping    PT Next Visit Plan  RTC repair protocol, Ankle ROM/strengthening, gait training    PT Home Exercise Plan  shoulder flexion supine with cane, pendulums, table slides, shoulder shrugs, towel scrunches, Inv/Ever of ankle    Consulted and Agree with Plan of Care  Patient       Patient will benefit from skilled therapeutic intervention in order to improve the following deficits and impairments:  Abnormal gait, Pain, Increased edema, Decreased activity tolerance, Decreased strength, Decreased balance, Decreased mobility, Difficulty walking, Impaired UE functional use, Decreased range of motion  Visit Diagnosis: Acute pain of right shoulder  Stiffness of right shoulder, not elsewhere classified  Pain in right ankle and joints of right foot  Difficulty in walking, not elsewhere classified     Problem List Patient Active Problem List   Diagnosis Date Noted  . Diverticulitis of colon 05/03/2015    Oretha Caprice, MPT 08/07/2017, 1:49 PM  [facility] [place], [place], [address] Phone: [phone]   Fax:  [phone]  Name: Aaron Krause MRN: [id number] Date of Birth: 1965-10-30

## 2017-08-09 ENCOUNTER — Encounter: Payer: Self-pay | Admitting: Physical Therapy

## 2017-08-09 ENCOUNTER — Ambulatory Visit: Payer: Worker's Compensation | Admitting: Physical Therapy

## 2017-08-09 DIAGNOSIS — M25571 Pain in right ankle and joints of right foot: Secondary | ICD-10-CM

## 2017-08-09 DIAGNOSIS — M25511 Pain in right shoulder: Secondary | ICD-10-CM | POA: Diagnosis not present

## 2017-08-09 DIAGNOSIS — R269 Unspecified abnormalities of gait and mobility: Secondary | ICD-10-CM

## 2017-08-09 DIAGNOSIS — M25611 Stiffness of right shoulder, not elsewhere classified: Secondary | ICD-10-CM

## 2017-08-09 DIAGNOSIS — R262 Difficulty in walking, not elsewhere classified: Secondary | ICD-10-CM

## 2017-08-09 NOTE — Therapy (Signed)
[facility] [place], [place], [address] Phone: [phone]   Fax:  [phone]  Physical Therapy Treatment  Patient Details  Name: Aaron Krause MRN: [id number] Date of Birth: 06-27-1966 Referring Provider: Susa Day   Encounter Date: 08/09/2017  PT End of Session - 08/09/17 0914    Visit Number  3    Number of Visits  12    Date for PT Re-Evaluation  09/06/17    Authorization Type  Pt has 12 visits approved from workers comp. Will need to reapply for further visits.     Authorization Time Period  1 month    Authorization - Visit Number  3    Authorization - Number of Visits  12    PT Start Time  0900    PT Stop Time  0955    PT Time Calculation (min)  55 min    Activity Tolerance  Patient tolerated treatment well    Behavior During Therapy  WFL for tasks assessed/performed       Past Medical History:  Diagnosis Date  . Arthritis   . Diverticulitis    with perforation reported  . Ruptured disk   . Trimalleolar fracture of right ankle     Past Surgical History:  Procedure Laterality Date  . ABDOMINAL SURGERY     diverticulitis perforation  . ORIF ANKLE FRACTURE Right 06/06/2017   Procedure: OPEN REDUCTION INTERNAL FIXATION (ORIF) ANKLE FRACTURE;  Surgeon: Wylene Simmer, MD;  Location: [facility];  Service: Orthopedics;  Laterality: Right;  . TOTAL HIP ARTHROPLASTY      There were no vitals filed for this visit.  Subjective Assessment - 08/09/17 0909    Subjective  Pt arriving today reporting 2/10 pain in his R shoulder and no pain in his ankle at rest and at beginning of session.     Pertinent History  s/p right trimalleolar fx s/p surgery on 06/06/17, s/p R rotator cuff repair on 07/15/17.     Patient Stated Goals  get back to work    Currently in Pain?  Yes    Pain Score  2     Pain Location  Shoulder    Pain Orientation  Right    Pain Descriptors / Indicators  Aching;Sore    Pain  Onset  More than a month ago    Pain Frequency  Constant    Aggravating Factors   movements    Pain Relieving Factors  resting, meds    Multiple Pain Sites  No         [facility] PT Assessment - 08/09/17 0001      Assessment   Medical Diagnosis  Trimalleolar Fx/dislocation right ankle, R rotator cuffer replair    Referring Provider  Susa Day    Hand Dominance  Right      Precautions   Precautions  Shoulder                  [facility] Adult PT Treatment/Exercise - 08/09/17 0001      Exercises   Exercises  Shoulder;Ankle      Shoulder Exercises: Supine   Flexion  AAROM;15 reps    ABduction  AAROM;15 reps      Shoulder Exercises: Seated   Other Seated Exercises  Seated Ranger flexion, circles bilateral directions      Shoulder Exercises: Pulleys   Flexion  3 minutes      Modalities   Modalities  Electrical Stimulation;Vasopneumatic  Acupuncturist Location  R shoulder and R ankle    Electrical Stimulation Action  IFC    Electrical Stimulation Parameters  80-150 Hz     Electrical Stimulation Goals  Pain      Vasopneumatic   Number Minutes Vasopneumatic   15 minutes    Vasopnuematic Location   Shoulder R ankle    Vasopneumatic Pressure  Low    Vasopneumatic Temperature   36      Manual Therapy   Manual Therapy  Passive ROM    Manual therapy comments  R shoulder, flexion/abd      Ankle Exercises: Stretches   Gastroc Stretch  3 reps;30 seconds using prostretch      Ankle Exercises: Aerobic   Stationary Bike  nustep no UE support level 4      Ankle Exercises: Seated   ABC's  1 rep    BAPS  Sitting;Level 2;15 reps    BAPS Limitations  both directions    Other Seated Ankle Exercises  ankle prostretch              PT Education - 08/09/17 0910    Education provided  Yes    Education Details  Added ankle ABC's     Person(s) Educated  Patient    Methods  Explanation;Demonstration    Comprehension  Verbalized  understanding;Returned demonstration          PT Long Term Goals - 08/09/17 1212      PT LONG TERM GOAL #1   Title  Pt will be independent with a HEP and progression.     Time  8    Period  Weeks    Status  New      PT LONG TERM GOAL #2   Title  Pt will improve his R SLS to >/= 30 seconds to improve balance and functional mobility.     Time  8    Period  Weeks    Status  New      PT LONG TERM GOAL #3   Title  Walk with normal gait pattern.    Time  8    Period  Weeks    Status  New      PT LONG TERM GOAL #4   Title  Pt will improve his R shoulder flexion to >/= 150 degrees.     Time  8    Period  Weeks    Status  New      PT LONG TERM GOAL #5   Title  Pt will improve his R grip strength to >/= 40 ppsi.     Time  8    Period  Weeks    Status  New            Plan - 08/09/17 1211    Clinical Impression Statement  Pt presenting today reporting 2/10 pain in his R shoulder, no pain at rest in his R ankle. Pt reporting continuing his HEP with mild soreness afterward. Recommended continue ice and elevation for his ankle and ice for his shoulder. Pt tolerating all exercises well. Conitnue skilled PT.     Rehab Potential  Good    Clinical Impairments Affecting Rehab Potential  only approved for 12 visits, due to multiple injuries pt may require additional visits    PT Frequency  3x / week    PT Duration  4 weeks    PT Treatment/Interventions  ADLs/Self Care Home Management;Cryotherapy;Electrical Stimulation;Therapeutic exercise;Therapeutic activities;Functional  mobility training;Stair training;Gait training;Ultrasound;Balance training;Neuromuscular re-education;Patient/family education;Manual techniques;Passive range of motion;Taping    PT Next Visit Plan  RTC repair protocol, Ankle ROM/strengthening, gait training    PT Home Exercise Plan  shoulder flexion supine with cane, pendulums, table slides, shoulder shrugs, towel scrunches, Inv/Ever of ankle    Consulted and  Agree with Plan of Care  Patient       Patient will benefit from skilled therapeutic intervention in order to improve the following deficits and impairments:  Abnormal gait, Pain, Increased edema, Decreased activity tolerance, Decreased strength, Decreased balance, Decreased mobility, Difficulty walking, Impaired UE functional use, Decreased range of motion  Visit Diagnosis: Acute pain of right shoulder  Stiffness of right shoulder, not elsewhere classified  Pain in right ankle and joints of right foot  Difficulty in walking, not elsewhere classified  Gait abnormality     Problem List Patient Active Problem List   Diagnosis Date Noted  . Diverticulitis of colon 05/03/2015    Oretha Caprice, PT 08/09/2017, 12:18 PM  [facility] [place], [place], [address] Phone: [phone]   Fax:  [phone]  Name: Juana Montini MRN: [id number] Date of Birth: October 24, 1965

## 2017-08-12 ENCOUNTER — Encounter: Payer: Self-pay | Admitting: Physical Therapy

## 2017-08-12 ENCOUNTER — Ambulatory Visit: Payer: Worker's Compensation | Admitting: Physical Therapy

## 2017-08-12 DIAGNOSIS — R269 Unspecified abnormalities of gait and mobility: Secondary | ICD-10-CM

## 2017-08-12 DIAGNOSIS — M25511 Pain in right shoulder: Secondary | ICD-10-CM

## 2017-08-12 DIAGNOSIS — M25571 Pain in right ankle and joints of right foot: Secondary | ICD-10-CM

## 2017-08-12 DIAGNOSIS — R262 Difficulty in walking, not elsewhere classified: Secondary | ICD-10-CM

## 2017-08-12 DIAGNOSIS — M25651 Stiffness of right hip, not elsewhere classified: Secondary | ICD-10-CM

## 2017-08-12 DIAGNOSIS — M25611 Stiffness of right shoulder, not elsewhere classified: Secondary | ICD-10-CM

## 2017-08-12 NOTE — Therapy (Signed)
[facility] [place], [place], [address] Phone: [phone]   Fax:  [phone]  Physical Therapy Treatment  Patient Details  Name: Aaron Krause MRN: [id number] Date of Birth: October 10, 1965 Referring Provider: Susa Day   Encounter Date: 08/12/2017  PT End of Session - 08/12/17 0953    Visit Number  4    Number of Visits  12    Date for PT Re-Evaluation  09/06/17    Authorization Type  Pt has 12 visits approved from workers comp. Will need to reapply for further visits.     Authorization Time Period  1 month    PT Start Time  0905    PT Stop Time  1000    PT Time Calculation (min)  55 min    Activity Tolerance  Patient tolerated treatment well    Behavior During Therapy  WFL for tasks assessed/performed       Past Medical History:  Diagnosis Date  . Arthritis   . Diverticulitis    with perforation reported  . Ruptured disk   . Trimalleolar fracture of right ankle     Past Surgical History:  Procedure Laterality Date  . ABDOMINAL SURGERY     diverticulitis perforation  . ORIF ANKLE FRACTURE Right 06/06/2017   Procedure: OPEN REDUCTION INTERNAL FIXATION (ORIF) ANKLE FRACTURE;  Surgeon: Wylene Simmer, MD;  Location: [facility];  Service: Orthopedics;  Laterality: Right;  . TOTAL HIP ARTHROPLASTY      There were no vitals filed for this visit.  Subjective Assessment - 08/12/17 0907    Subjective  Patient arrived with ongoing "soreness" in RT shoulder and RT ankle today    Pertinent History  s/p right trimalleolar fx s/p surgery on 06/06/17, s/p R rotator cuff repair on 07/15/17.     Patient Stated Goals  get back to work    Currently in Pain?  Yes    Pain Score  2     Pain Location  Shoulder    Pain Orientation  Right    Pain Descriptors / Indicators  Sore    Pain Type  Surgical pain    Pain Onset  1 to 4 weeks ago    Pain Frequency  Constant    Aggravating Factors   movement    Pain  Relieving Factors  rest    Pain Score  3    Pain Location  Ankle    Pain Orientation  Right    Pain Descriptors / Indicators  Sore    Pain Type  Surgical pain    Pain Onset  More than a month ago    Pain Frequency  Constant    Aggravating Factors   walking    Pain Relieving Factors  at rest                      [facility] Adult PT Treatment/Exercise - 08/12/17 0001      Shoulder Exercises: Seated   Other Seated Exercises  Seated Ranger flexion, circles bilateral directions x32min      Shoulder Exercises: Pulleys   Flexion  3 minutes      Electrical Stimulation   Electrical Stimulation Location  R shoulder     Electrical Stimulation Action  IFC    Electrical Stimulation Parameters  80-150hz x34min    Electrical Stimulation Goals  Pain      Vasopneumatic   Number Minutes Vasopneumatic   15 minutes    Vasopnuematic Location  Shoulder    Vasopneumatic Pressure  Low      Manual Therapy   Manual Therapy  Passive ROM    Manual therapy comments  manual PROM to right shoulder flexion and ER with gentle range      Ankle Exercises: Seated   Other Seated Ankle Exercises  ankle prostretch x61min      Ankle Exercises: Aerobic   Stationary Bike  59min L4                  PT Long Term Goals - 08/12/17 0929      PT LONG TERM GOAL #1   Title  Pt will be independent with a HEP and progression.     Time  8    Period  Weeks    Status  On-going      PT LONG TERM GOAL #2   Title  Pt will improve his R SLS to >/= 30 seconds to improve balance and functional mobility.     Time  8    Period  Weeks    Status  On-going      PT LONG TERM GOAL #3   Title  Walk with normal gait pattern.    Time  8    Period  Weeks    Status  On-going      PT LONG TERM GOAL #4   Title  Pt will improve his R shoulder flexion to >/= 150 degrees.     Time  8    Period  Weeks    Status  On-going      PT LONG TERM GOAL #5   Title  Pt will improve his R grip strength to >/= 40 ppsi.      Time  8    Period  Weeks    Status  On-going            Plan - 08/12/17 0954    Clinical Impression Statement  Patient tolerated treatment well today. Patient has some ongoing soreness yet progressing well. Patient reported doing HEP daily as instructed. Today issued yelow thera putty. Today goals ongoing due to pain ROM and strength deficts.    History and Personal Factors relevant to plan of care:  shlr surgery 07/15/17 current 4 week 08/12/17, ankle sugery 06/06/17 current 8 weeks 08/01/16    Rehab Potential  Good    Clinical Impairments Affecting Rehab Potential  only approved for 12 visits, due to multiple injuries pt may require additional visits    PT Frequency  3x / week    PT Duration  4 weeks    PT Treatment/Interventions  ADLs/Self Care Home Management;Cryotherapy;Electrical Stimulation;Therapeutic exercise;Therapeutic activities;Functional mobility training;Stair training;Gait training;Ultrasound;Balance training;Neuromuscular re-education;Patient/family education;Manual techniques;Passive range of motion;Taping    PT Next Visit Plan  RTC repair protocol, Ankle ROM/strengthening, gait training (MD 08/26/17 to both doctors)    Consulted and Agree with Plan of Care  Patient       Patient will benefit from skilled therapeutic intervention in order to improve the following deficits and impairments:  Abnormal gait, Pain, Increased edema, Decreased activity tolerance, Decreased strength, Decreased balance, Decreased mobility, Difficulty walking, Impaired UE functional use, Decreased range of motion  Visit Diagnosis: Acute pain of right shoulder  Stiffness of right shoulder, not elsewhere classified  Pain in right ankle and joints of right foot  Difficulty in walking, not elsewhere classified  Gait abnormality  Hip stiffness, right     Problem List Patient Active Problem List  Diagnosis Date Noted  . Diverticulitis of colon 05/03/2015    Phillips Climes,  PTA 08/12/2017, 10:01 AM  [facility] [place], [place], [address] Phone: [phone]   Fax:  [phone]  Name: Aaron Krause MRN: [id number] Date of Birth: [birth year]

## 2017-08-14 ENCOUNTER — Ambulatory Visit: Payer: Worker's Compensation | Admitting: Physical Therapy

## 2017-08-14 DIAGNOSIS — R262 Difficulty in walking, not elsewhere classified: Secondary | ICD-10-CM

## 2017-08-14 DIAGNOSIS — M25511 Pain in right shoulder: Secondary | ICD-10-CM | POA: Diagnosis not present

## 2017-08-14 DIAGNOSIS — M25611 Stiffness of right shoulder, not elsewhere classified: Secondary | ICD-10-CM

## 2017-08-14 DIAGNOSIS — R269 Unspecified abnormalities of gait and mobility: Secondary | ICD-10-CM

## 2017-08-14 DIAGNOSIS — M25571 Pain in right ankle and joints of right foot: Secondary | ICD-10-CM

## 2017-08-14 DIAGNOSIS — M25651 Stiffness of right hip, not elsewhere classified: Secondary | ICD-10-CM

## 2017-08-14 NOTE — Therapy (Signed)
[facility] [place], [place], [address] Phone: [phone]   Fax:  [phone]  Physical Therapy Treatment  Patient Details  Name: Aaron Krause MRN: [id number] Date of Birth: October 27, 1965 Referring Provider: Susa Day   Encounter Date: 08/14/2017  PT End of Session - 08/14/17 1006    Visit Number  5    Number of Visits  12    Date for PT Re-Evaluation  09/06/17    PT Start Time  0930    PT Stop Time  1037    PT Time Calculation (min)  67 min    Activity Tolerance  Patient tolerated treatment well;No increased pain    Behavior During Therapy  WFL for tasks assessed/performed       Past Medical History:  Diagnosis Date  . Arthritis   . Diverticulitis    with perforation reported  . Ruptured disk   . Trimalleolar fracture of right ankle     Past Surgical History:  Procedure Laterality Date  . ABDOMINAL SURGERY     diverticulitis perforation  . ORIF ANKLE FRACTURE Right 06/06/2017   Procedure: OPEN REDUCTION INTERNAL FIXATION (ORIF) ANKLE FRACTURE;  Surgeon: Wylene Simmer, MD;  Location: [place];  Service: Orthopedics;  Laterality: Right;  . TOTAL HIP ARTHROPLASTY      There were no vitals filed for this visit.  Subjective Assessment - 08/14/17 1044    Subjective  Patient reported the shoulder is feeling sore. The right ankle is more sore and swollen today due to walking in the pool at the [facility] yesterday.    Currently in Pain?  Yes    Pain Score  2     Pain Location  Shoulder    Pain Orientation  Right    Pain Descriptors / Indicators  Sore    Pain Score  3    Pain Location  Ankle    Pain Orientation  Right    Pain Descriptors / Indicators  Sore                      [facility] Adult PT Treatment/Exercise - 08/14/17 0001      Shoulder Exercises: Supine   External Rotation  PROM;15 reps;Right 3 second hold      Manual Therapy   Manual Therapy  Passive ROM    Passive ROM   Right shoulder flexion and ER in scapular plane      Ankle Exercises: Stretches   Soleus Stretch  30 seconds;3 reps    Gastroc Stretch  3 reps;30 seconds      Ankle Exercises: Supine   T-Band  Four way ankle, PF/DF/IN/EV x20 Red             PT Education - 08/14/17 1227    Education provided  Yes    Education Details  Seated Right PROM external rotatin with cane.    Person(s) Educated  Patient    Methods  Explanation;Demonstration    Comprehension  Verbalized understanding;Returned demonstration          PT Long Term Goals - 08/12/17 0929      PT LONG TERM GOAL #1   Title  Pt will be independent with a HEP and progression.     Time  8    Period  Weeks    Status  On-going      PT LONG TERM GOAL #2   Title  Pt will improve his R SLS to >/= 30 seconds  to improve balance and functional mobility.     Time  8    Period  Weeks    Status  On-going      PT LONG TERM GOAL #3   Title  Walk with normal gait pattern.    Time  8    Period  Weeks    Status  On-going      PT LONG TERM GOAL #4   Title  Pt will improve his R shoulder flexion to >/= 150 degrees.     Time  8    Period  Weeks    Status  On-going      PT LONG TERM GOAL #5   Title  Pt will improve his R grip strength to >/= 40 ppsi.     Time  8    Period  Weeks    Status  On-going            Plan - 08/14/17 1223    Clinical Impression Statement  Patient was able to tolerate treatment well today as seen by no increase of pain or soreness in right shoulder and ankle. Patient instructed on seated external rotation with cane. Patient demonstrated good form with no cuing required. No adverse affects found upon removal of vasoneumatic device or e-stim.    Rehab Potential  Good    Clinical Impairments Affecting Rehab Potential  only approved for 12 visits, due to multiple injuries pt may require additional visits    PT Frequency  3x / week    PT Duration  4 weeks    PT Treatment/Interventions  ADLs/Self  Care Home Management;Cryotherapy;Electrical Stimulation;Therapeutic exercise;Therapeutic activities;Functional mobility training;Stair training;Gait training;Ultrasound;Balance training;Neuromuscular re-education;Patient/family education;Manual techniques;Passive range of motion;Taping    PT Next Visit Plan  RTC repair protocol, Ankle ROM/strengthening, gait training (MD 08/26/17 to both doctors)    PT [place] seated PROM shoulder external rotation.    Consulted and Agree with Plan of Care  Patient       Patient will benefit from skilled therapeutic intervention in order to improve the following deficits and impairments:  Abnormal gait, Pain, Increased edema, Decreased activity tolerance, Decreased strength, Decreased balance, Decreased mobility, Difficulty walking, Impaired UE functional use, Decreased range of motion  Visit Diagnosis: Acute pain of right shoulder  Stiffness of right shoulder, not elsewhere classified  Pain in right ankle and joints of right foot  Difficulty in walking, not elsewhere classified  Gait abnormality  Hip stiffness, right     Problem List Patient Active Problem List   Diagnosis Date Noted  . Diverticulitis of colon 05/03/2015   Gabriela Eves, PT, DPT 08/14/2017, 12:33 PM  [facility] [address] [place], [place], [address] Phone: [phone]   Fax:  [phone]  Name: Brnadon Eoff MRN: [id number] Date of Birth: 1965-10-04

## 2017-08-16 ENCOUNTER — Ambulatory Visit: Payer: Worker's Compensation | Admitting: Physical Therapy

## 2017-08-16 ENCOUNTER — Encounter: Payer: Self-pay | Admitting: Physical Therapy

## 2017-08-16 DIAGNOSIS — M25511 Pain in right shoulder: Secondary | ICD-10-CM | POA: Diagnosis not present

## 2017-08-16 DIAGNOSIS — R269 Unspecified abnormalities of gait and mobility: Secondary | ICD-10-CM

## 2017-08-16 DIAGNOSIS — M25611 Stiffness of right shoulder, not elsewhere classified: Secondary | ICD-10-CM

## 2017-08-16 DIAGNOSIS — M25571 Pain in right ankle and joints of right foot: Secondary | ICD-10-CM

## 2017-08-16 DIAGNOSIS — R262 Difficulty in walking, not elsewhere classified: Secondary | ICD-10-CM

## 2017-08-16 NOTE — Therapy (Signed)
[facility] [place], [place], [address] Phone: [phone]   Fax:  [phone]  Physical Therapy Treatment  Patient Details  Name: Aaron Krause MRN: [id number] Date of Birth: 10/07/1965 Referring Provider: Susa Day, MD   Encounter Date: 08/16/2017  PT End of Session - 08/16/17 0913    Visit Number  6    Number of Visits  12    Date for PT Re-Evaluation  09/06/17    Authorization Type  Pt has 12 visits approved from workers comp. Will need to reapply for further visits.     Authorization Time Period  1 month    Authorization - Visit Number  6    Authorization - Number of Visits  12    PT Start Time  [phone]    PT Stop Time  0950    PT Time Calculation (min)  45 min    Activity Tolerance  Patient tolerated treatment well;No increased pain    Behavior During Therapy  WFL for tasks assessed/performed       Past Medical History:  Diagnosis Date  . Arthritis   . Diverticulitis    with perforation reported  . Ruptured disk   . Trimalleolar fracture of right ankle     Past Surgical History:  Procedure Laterality Date  . ABDOMINAL SURGERY     diverticulitis perforation  . ORIF ANKLE FRACTURE Right 06/06/2017   Procedure: OPEN REDUCTION INTERNAL FIXATION (ORIF) ANKLE FRACTURE;  Surgeon: Wylene Simmer, MD;  Location: [facility];  Service: Orthopedics;  Laterality: Right;  . TOTAL HIP ARTHROPLASTY      There were no vitals filed for this visit.  Subjective Assessment - 08/16/17 0909    Subjective  Pt arriving a few minutes late today. Pt reporting stiffness more in his R ankle and soreness in R shoulder.     Pertinent History  s/p right trimalleolar fx s/p surgery on 06/06/17, s/p R rotator cuff repair on 07/15/17.     Patient Stated Goals  get back to work    Currently in Pain?  Yes    Pain Score  2     Pain Location  Shoulder    Pain Orientation  Right    Pain Descriptors / Indicators  Sore    Pain Type  Surgical pain    Pain Onset  1 to 4 weeks ago    Pain Frequency  Constant    Aggravating Factors   movements    Pain Relieving Factors  resting    Effect of Pain on Daily Activities  difficulty sleeping at times    Multiple Pain Sites  No         [facility] PT Assessment - 08/16/17 0001      Assessment   Medical Diagnosis  Trimalleolar Fx/dislocation right ankle, R rotator cuffer replair    Referring Provider  Susa Day, MD    Hand Dominance  Right      Precautions   Precautions  Shoulder                  [facility] Adult PT Treatment/Exercise - 08/16/17 0001      Shoulder Exercises: Supine   External Rotation  AAROM;Right;15 reps with cane    Flexion  AAROM;Right;20 reps with cane      Shoulder Exercises: Standing   Flexion  10 reps wall wash    Other Standing Exercises  UE ranger flexion      Shoulder Exercises: Pulleys  Flexion  3 minutes      Modalities   Modalities  Cryotherapy;Electrical Stimulation;Vasopneumatic      Cryotherapy   Number Minutes Cryotherapy  15 Minutes    Cryotherapy Location  Ankle    Type of Cryotherapy  Ice pack      Electrical Stimulation   Electrical Stimulation Location  R shoulder    Electrical Stimulation Action  IFC    Electrical Stimulation Parameters  80-150 Hz x 15 minutes, intensity to toleracne    Electrical Stimulation Goals  Pain      Vasopneumatic   Number Minutes Vasopneumatic   15 minutes    Vasopnuematic Location   Shoulder    Vasopneumatic Pressure  Medium    Vasopneumatic Temperature   34      Manual Therapy   Manual Therapy  Passive ROM    Manual therapy comments  right shoulder, flexion and ER      Ankle Exercises: Seated   BAPS  Sitting;Level 2    BAPS Limitations  both directions x 2 minutes      Ankle Exercises: Aerobic   Stationary Bike  L4 x 10 minutes      Ankle Exercises: Standing   Rocker Board  2 minutes    Other Standing Ankle Exercises  Standing lunges with right foot on  double steps holding stretch x 5 seconds for 1 minutes      Ankle Exercises: Stretches   Soleus Stretch  30 seconds;3 reps    Gastroc Stretch  3 reps;30 seconds             PT Education - 08/16/17 0911    Education provided  Yes    Education Details  Reviewed HEP    Person(s) Educated  Patient    Methods  Explanation    Comprehension  Verbalized understanding          PT Long Term Goals - 08/16/17 0915      PT LONG TERM GOAL #1   Title  Pt will be independent with a HEP and progression.     Time  8    Period  Weeks    Status  On-going      PT LONG TERM GOAL #2   Title  Pt will improve his R SLS to >/= 30 seconds to improve balance and functional mobility.     Time  8    Period  Weeks    Status  On-going      PT LONG TERM GOAL #3   Title  Walk with normal gait pattern with no pain reported.     Time  8    Period  Weeks    Status  On-going      PT LONG TERM GOAL #4   Title  Pt will improve his R shoulder flexion to >/= 150 degrees.     Time  8    Period  Weeks    Status  On-going      PT LONG TERM GOAL #5   Title  Pt will improve his R grip strength to >/= 40 ppsi.     Time  8    Period  Weeks    Status  On-going            Plan - 08/16/17 0914    Clinical Impression Statement  Patient arriving to therapy reporting R ankle stiffness and R shoulder soreness 2/10. Pt tolerating all exercises well of the R shoulder and R ankle. Pt  verbally reviewed his HEP. Continue with skilled PT to progress toward LTG's and return to work.     Rehab Potential  Good    Clinical Impairments Affecting Rehab Potential  only approved for 12 visits, due to multiple injuries pt may require additional visits    PT Frequency  3x / week    PT Duration  4 weeks    PT Treatment/Interventions  ADLs/Self Care Home Management;Cryotherapy;Electrical Stimulation;Therapeutic exercise;Therapeutic activities;Functional mobility training;Stair training;Gait training;Ultrasound;Balance  training;Neuromuscular re-education;Patient/family education;Manual techniques;Passive range of motion;Taping    PT Next Visit Plan  RTC repair protocol, Ankle ROM/strengthening, gait training (MD 08/26/17 to both doctors)    PT [place] seated PROM shoulder external rotation.    Consulted and Agree with Plan of Care  Patient       Patient will benefit from skilled therapeutic intervention in order to improve the following deficits and impairments:  Abnormal gait, Pain, Increased edema, Decreased activity tolerance, Decreased strength, Decreased balance, Decreased mobility, Difficulty walking, Impaired UE functional use, Decreased range of motion  Visit Diagnosis: Acute pain of right shoulder  Stiffness of right shoulder, not elsewhere classified  Pain in right ankle and joints of right foot  Difficulty in walking, not elsewhere classified  Gait abnormality     Problem List Patient Active Problem List   Diagnosis Date Noted  . Diverticulitis of colon 05/03/2015    Oretha Caprice, PT 08/16/2017, 9:53 AM  [facility] [place], [place], [address] Phone: [phone]   Fax:  [phone]  Name: Haeden Hudock MRN: [id number] Date of Birth: Jul 09, 1966

## 2017-08-19 ENCOUNTER — Ambulatory Visit: Payer: Worker's Compensation | Admitting: Physical Therapy

## 2017-08-19 ENCOUNTER — Encounter: Payer: Self-pay | Admitting: Physical Therapy

## 2017-08-19 DIAGNOSIS — M25611 Stiffness of right shoulder, not elsewhere classified: Secondary | ICD-10-CM

## 2017-08-19 DIAGNOSIS — R262 Difficulty in walking, not elsewhere classified: Secondary | ICD-10-CM

## 2017-08-19 DIAGNOSIS — M25571 Pain in right ankle and joints of right foot: Secondary | ICD-10-CM

## 2017-08-19 DIAGNOSIS — R269 Unspecified abnormalities of gait and mobility: Secondary | ICD-10-CM

## 2017-08-19 DIAGNOSIS — M25511 Pain in right shoulder: Secondary | ICD-10-CM

## 2017-08-19 NOTE — Therapy (Signed)
[facility] [place], [place], [address] Phone: [phone]   Fax:  [phone]  Physical Therapy Treatment  Patient Details  Name: Aaron Krause MRN: [id number] Date of Birth: 11/05/1965 Referring Provider: Susa Day, MD   Encounter Date: 08/19/2017  PT End of Session - 08/19/17 0906    Visit Number  7    Number of Visits  12    Date for PT Re-Evaluation  09/06/17    Authorization Type  Pt has 12 visits approved from workers comp. Will need to reapply for further visits.     Authorization Time Period  1 month    Authorization - Visit Number  6    Authorization - Number of Visits  12    PT Start Time  0907    PT Stop Time  0958    PT Time Calculation (min)  51 min    Activity Tolerance  Patient tolerated treatment well;No increased pain    Behavior During Therapy  WFL for tasks assessed/performed       Past Medical History:  Diagnosis Date  . Arthritis   . Diverticulitis    with perforation reported  . Ruptured disk   . Trimalleolar fracture of right ankle     Past Surgical History:  Procedure Laterality Date  . ABDOMINAL SURGERY     diverticulitis perforation  . ORIF ANKLE FRACTURE Right 06/06/2017   Procedure: OPEN REDUCTION INTERNAL FIXATION (ORIF) ANKLE FRACTURE;  Surgeon: Wylene Simmer, MD;  Location: [place] [facility];  Service: Orthopedics;  Laterality: Right;  . TOTAL HIP ARTHROPLASTY      There were no vitals filed for this visit.  Subjective Assessment - 08/19/17 0906    Subjective  Reports that he did not do much yesterday and laid around with his foot up.    Pertinent History  s/p right trimalleolar fx s/p surgery on 06/06/17, s/p R rotator cuff repair on 07/15/17.     Patient Stated Goals  get back to work    Currently in Pain?  Yes    Pain Score  2     Pain Location  Shoulder    Pain Orientation  Right    Pain Descriptors / Indicators  Sore    Pain Type  Surgical pain    Pain  Onset  1 to 4 weeks ago    Pain Frequency  Constant    Multiple Pain Sites  Yes    Pain Score  2    Pain Location  Ankle    Pain Orientation  Right    Pain Descriptors / Indicators  Sore    Pain Type  Surgical pain    Pain Onset  More than a month ago    Pain Frequency  Constant         [facility] PT Assessment - 08/19/17 0001      Assessment   Medical Diagnosis  Trimalleolar Fx/dislocation right ankle, R rotator cuffer replair    Hand Dominance  Right    Next MD Visit  08/26/2017 Seeing Dr. Tonita Cong and Dr. Doran Durand      Precautions   Precautions  Shoulder                  [facility] Adult PT Treatment/Exercise - 08/19/17 0001      Modalities   Modalities  Cryotherapy;Electrical Stimulation      Cryotherapy   Number Minutes Cryotherapy  15 Minutes    Cryotherapy Location  Shoulder;Ankle  Type of Cryotherapy  Ice pack      Electrical Stimulation   Electrical Stimulation Location  R shoulder, R ankle    Electrical Stimulation Action  Pre-Mod    Electrical Stimulation Parameters  80-150 hz x15 min    Electrical Stimulation Goals  Pain;Edema      Manual Therapy   Manual Therapy  Passive ROM    Passive ROM  PROM of R shoulder into flexion, ER, IR with gentle holds at end range      Ankle Exercises: Aerobic   Stationary Bike  L2 x 10 minutes      Ankle Exercises: Standing   Rocker Board  4 minutes    Heel Raises  15 reps    Toe Raise  15 reps    Other Standing Ankle Exercises  L forward lunge x10 reps                  PT Long Term Goals - 08/16/17 0915      PT LONG TERM GOAL #1   Title  Pt will be independent with a HEP and progression.     Time  8    Period  Weeks    Status  On-going      PT LONG TERM GOAL #2   Title  Pt will improve his R SLS to >/= 30 seconds to improve balance and functional mobility.     Time  8    Period  Weeks    Status  On-going      PT LONG TERM GOAL #3   Title  Walk with normal gait pattern with no pain reported.      Time  8    Period  Weeks    Status  On-going      PT LONG TERM GOAL #4   Title  Pt will improve his R shoulder flexion to >/= 150 degrees.     Time  8    Period  Weeks    Status  On-going      PT LONG TERM GOAL #5   Title  Pt will improve his R grip strength to >/= 40 ppsi.     Time  8    Period  Weeks    Status  On-going            Plan - 08/19/17 0954    Clinical Impression Statement  Patient tolerated today's well as he arrived with R shoulder and ankle soreness reports. Patient able to complete standing ankle exercises fairly well as he reported weakness with heel raises. Patient experienced increased discomfort with PROM into flexion when descending. Patient initially limited with PROM into ER but loosened as PROM progressed. Normal modalities response noted following removal of the modalities. Patient's R ankle observed with increased edema but patient compliant with compression sock for RLE.    Rehab Potential  Good    Clinical Impairments Affecting Rehab Potential  only approved for 12 visits, due to multiple injuries pt may require additional visits    PT Frequency  3x / week    PT Duration  4 weeks    PT Treatment/Interventions  ADLs/Self Care Home Management;Cryotherapy;Electrical Stimulation;Therapeutic exercise;Therapeutic activities;Functional mobility training;Stair training;Gait training;Ultrasound;Balance training;Neuromuscular re-education;Patient/family education;Manual techniques;Passive range of motion;Taping    PT Next Visit Plan  RTC repair protocol, Ankle ROM/strengthening, gait training (MD 08/26/17 to both doctors)    PT [place] seated PROM shoulder external rotation.    Consulted and Agree  with Plan of Care  Patient       Patient will benefit from skilled therapeutic intervention in order to improve the following deficits and impairments:  Abnormal gait, Pain, Increased edema, Decreased activity tolerance, Decreased strength, Decreased  balance, Decreased mobility, Difficulty walking, Impaired UE functional use, Decreased range of motion  Visit Diagnosis: Acute pain of right shoulder  Stiffness of right shoulder, not elsewhere classified  Pain in right ankle and joints of right foot  Difficulty in walking, not elsewhere classified  Gait abnormality     Problem List Patient Active Problem List   Diagnosis Date Noted  . Diverticulitis of colon 05/03/2015    Standley Brooking, PTA 08/19/2017, 10:22 AM  [facility] [address] [place], [place], [address] Phone: [phone]   Fax:  [phone]  Name: Aaron Krause MRN: [id number] Date of Birth: 1966-05-02

## 2017-08-21 ENCOUNTER — Ambulatory Visit: Payer: Worker's Compensation | Admitting: Physical Therapy

## 2017-08-21 DIAGNOSIS — M25571 Pain in right ankle and joints of right foot: Secondary | ICD-10-CM

## 2017-08-21 DIAGNOSIS — M25511 Pain in right shoulder: Secondary | ICD-10-CM | POA: Diagnosis not present

## 2017-08-21 DIAGNOSIS — M25611 Stiffness of right shoulder, not elsewhere classified: Secondary | ICD-10-CM

## 2017-08-21 DIAGNOSIS — R262 Difficulty in walking, not elsewhere classified: Secondary | ICD-10-CM

## 2017-08-21 NOTE — Therapy (Signed)
[facility] [place], [place], [address] Phone: [phone]   Fax:  [phone]  Physical Therapy Treatment  Patient Details  Name: Aaron Krause MRN: [id number] Date of Birth: 18-Dec-1965 Referring Provider: Susa Day, MD   Encounter Date: 08/21/2017  PT End of Session - 08/21/17 0911    Visit Number  8    Number of Visits  12    Date for PT Re-Evaluation  09/06/17    Authorization Type  Pt has 12 visits approved from workers comp. Will need to reapply for further visits.     Authorization Time Period  1 month    PT Start Time  0910    PT Stop Time  1007    PT Time Calculation (min)  57 min    Activity Tolerance  Patient tolerated treatment well;No increased pain    Behavior During Therapy  WFL for tasks assessed/performed       Past Medical History:  Diagnosis Date  . Arthritis   . Diverticulitis    with perforation reported  . Ruptured disk   . Trimalleolar fracture of right ankle     Past Surgical History:  Procedure Laterality Date  . ABDOMINAL SURGERY     diverticulitis perforation  . ORIF ANKLE FRACTURE Right 06/06/2017   Procedure: OPEN REDUCTION INTERNAL FIXATION (ORIF) ANKLE FRACTURE;  Surgeon: Wylene Simmer, MD;  Location: [facility];  Service: Orthopedics;  Laterality: Right;  . TOTAL HIP ARTHROPLASTY      There were no vitals filed for this visit.  Subjective Assessment - 08/21/17 0914    Subjective  Patient reported his foot is a little more sore than usual because of a stumble. Patient was conscious to use left arm to gain balance. Patient iced and performed gentle ankle AROM to prevent ankle from stiffening up. Patient reports ankle pain at 2-3/10 and the R shoulder at 1-2/10.    Pertinent History  s/p right trimalleolar fx s/p surgery on 06/06/17, s/p R rotator cuff repair on 07/15/17.     Currently in Pain?  Yes    Pain Score  2     Pain Location  Shoulder    Pain Orientation   Right    Pain Descriptors / Indicators  Sore    Pain Type  Surgical pain    Pain Onset  More than a month ago    Pain Frequency  Constant    Pain Score  3    Pain Location  Ankle    Pain Orientation  Right    Pain Descriptors / Indicators  Sore    Pain Type  Surgical pain    Pain Onset  More than a month ago    Pain Frequency  Constant                      [facility] Adult PT Treatment/Exercise - 08/21/17 0001      Shoulder Exercises: Supine   External Rotation  AAROM;Right;15 reps With cane 3" hold at end range      Shoulder Exercises: Seated   Flexion  AAROM;Right;20 reps with UE       Electrical Stimulation   Electrical Stimulation Location  Right shoulder    Electrical Stimulation Action  IFC    Electrical Stimulation Parameters  80-150 hz x15 min    Electrical Stimulation Goals  Pain      Vasopneumatic   Number Minutes Vasopneumatic   15 minutes  Vasopnuematic Location   Shoulder    Vasopneumatic Pressure  Low    Vasopneumatic Temperature   34      Manual Therapy   Manual Therapy  Passive ROM    Passive ROM  PROM of R shoulder into flexion, ER, gentle holds at end range      Ankle Exercises: Aerobic   Stationary Bike  L4 x2 x10 minutes      Ankle Exercises: Seated   BAPS  Sitting;Level 3;15 reps    BAPS Limitations  clockwise, counterclockwise, PF/DF                  PT Long Term Goals - 08/16/17 0915      PT LONG TERM GOAL #1   Title  Pt will be independent with a HEP and progression.     Time  8    Period  Weeks    Status  On-going      PT LONG TERM GOAL #2   Title  Pt will improve his R SLS to >/= 30 seconds to improve balance and functional mobility.     Time  8    Period  Weeks    Status  On-going      PT LONG TERM GOAL #3   Title  Walk with normal gait pattern with no pain reported.     Time  8    Period  Weeks    Status  On-going      PT LONG TERM GOAL #4   Title  Pt will improve his R shoulder flexion to >/= 150  degrees.     Time  8    Period  Weeks    Status  On-going      PT LONG TERM GOAL #5   Title  Pt will improve his R grip strength to >/= 40 ppsi.     Time  8    Period  Weeks    Status  On-going            Plan - 08/21/17 1016    Clinical Impression Statement  Patient was able to tolerate treatment despite increased soreness in R ankle. Patient reported anterior shoulder discomfort during the descent of shoulder flexion PROM. Patient stated after some rest the discomfort dissipated. Patient stated being compliant with shoulder HEP to improve PROM flexion and ER. No adverse affects noted upon removal of modalities.     Rehab Potential  Good    Clinical Impairments Affecting Rehab Potential  only approved for 12 visits, due to multiple injuries pt may require additional visits    PT Frequency  3x / week    PT Duration  4 weeks    PT Treatment/Interventions  ADLs/Self Care Home Management;Cryotherapy;Electrical Stimulation;Therapeutic exercise;Therapeutic activities;Functional mobility training;Stair training;Gait training;Ultrasound;Balance training;Neuromuscular re-education;Patient/family education;Manual techniques;Passive range of motion;Taping    PT Next Visit Plan  RTC repair protocol, Ankle ROM/strengthening, gait training (MD 08/26/17 to both doctors)    Consulted and Agree with Plan of Care  Patient       Patient will benefit from skilled therapeutic intervention in order to improve the following deficits and impairments:  Abnormal gait, Pain, Increased edema, Decreased activity tolerance, Decreased strength, Decreased balance, Decreased mobility, Difficulty walking, Impaired UE functional use, Decreased range of motion  Visit Diagnosis: Acute pain of right shoulder  Stiffness of right shoulder, not elsewhere classified  Pain in right ankle and joints of right foot  Difficulty in walking, not elsewhere classified  Problem List Patient Active Problem List    Diagnosis Date Noted  . Diverticulitis of colon 05/03/2015    Gabriela Eves, PT, DPT 08/21/2017, 10:33 AM  [facility] [address] [place], [place], [address] Phone: [phone]   Fax:  [phone]  Name: Aaron Krause MRN: [id number] Date of Birth: June 21, 1966

## 2017-08-23 ENCOUNTER — Encounter: Payer: Self-pay | Admitting: Physical Therapy

## 2017-08-23 ENCOUNTER — Ambulatory Visit: Payer: Worker's Compensation | Admitting: Physical Therapy

## 2017-08-23 DIAGNOSIS — R269 Unspecified abnormalities of gait and mobility: Secondary | ICD-10-CM

## 2017-08-23 DIAGNOSIS — M25511 Pain in right shoulder: Secondary | ICD-10-CM | POA: Diagnosis not present

## 2017-08-23 DIAGNOSIS — M25571 Pain in right ankle and joints of right foot: Secondary | ICD-10-CM

## 2017-08-23 DIAGNOSIS — R262 Difficulty in walking, not elsewhere classified: Secondary | ICD-10-CM

## 2017-08-23 DIAGNOSIS — M25611 Stiffness of right shoulder, not elsewhere classified: Secondary | ICD-10-CM

## 2017-08-23 NOTE — Therapy (Signed)
[facility] [place], [place], [address] Phone: [phone]   Fax:  [phone]  Physical Therapy Treatment  Patient Details  Name: Aaron Krause MRN: [id number] Date of Birth: 1966-07-12 Referring Provider: Susa Day, MD   Encounter Date: 08/23/2017  PT End of Session - 08/23/17 0909    Visit Number  9    Number of Visits  12    Date for PT Re-Evaluation  09/06/17    Authorization Type  Pt has 12 visits approved from workers comp. Will need to reapply for further visits.     Authorization Time Period  1 month    Authorization - Visit Number  6    Authorization - Number of Visits  12    PT Start Time  0848    PT Stop Time  [phone]    PT Time Calculation (min)  51 min    Activity Tolerance  Patient tolerated treatment well    Behavior During Therapy  WFL for tasks assessed/performed       Past Medical History:  Diagnosis Date  . Arthritis   . Diverticulitis    with perforation reported  . Ruptured disk   . Trimalleolar fracture of right ankle     Past Surgical History:  Procedure Laterality Date  . ABDOMINAL SURGERY     diverticulitis perforation  . ORIF ANKLE FRACTURE Right 06/06/2017   Procedure: OPEN REDUCTION INTERNAL FIXATION (ORIF) ANKLE FRACTURE;  Surgeon: Wylene Simmer, MD;  Location: [facility];  Service: Orthopedics;  Laterality: Right;  . TOTAL HIP ARTHROPLASTY      There were no vitals filed for this visit.  Subjective Assessment - 08/23/17 0858    Subjective  Reports being very diligent about his gait cycle but feels at times he falls back into a limp. Reports that he goes back to both surgeons on Monday. Reports that she has more soreness in R shoulder but no real pain.    Pertinent History  s/p right trimalleolar fx s/p surgery on 06/06/17, s/p R rotator cuff repair on 07/15/17.     Patient Stated Goals  get back to work    Currently in Pain?  Yes    Pain Score  2     Pain  Location  Ankle    Pain Orientation  Right    Pain Descriptors / Indicators  Sore    Pain Type  Surgical pain    Pain Onset  More than a month ago         [facility] PT Assessment - 08/23/17 0001      Assessment   Medical Diagnosis  Trimalleolar Fx/dislocation right ankle, R rotator cuffer replair    Hand Dominance  Right    Next MD Visit  08/26/2017      Precautions   Precautions  Shoulder      ROM / Strength   AROM / PROM / Strength  AROM;Strength      AROM   Overall AROM   Within functional limits for tasks performed;Deficits    AROM Assessment Site  Ankle    Right/Left Shoulder  Right    Right/Left Ankle  Right    Right Ankle Dorsiflexion  6    Right Ankle Plantar Flexion  32      PROM   Overall PROM   Deficits    PROM Assessment Site  Shoulder    Right/Left Shoulder  Right    Right Shoulder Flexion  115 Degrees  Right Shoulder External Rotation  41 Degrees      Strength   Overall Strength  Deficits    Strength Assessment Site  Shoulder;Ankle    Right/Left Shoulder  Right    Right/Left Ankle  Right    Right Ankle Dorsiflexion  4+/5    Right Ankle Plantar Flexion  4+/5    Right Ankle Inversion  4/5    Right Ankle Eversion  4+/5                  [facility] Adult PT Treatment/Exercise - 08/23/17 0001      Modalities   Modalities  Cryotherapy;Electrical Stimulation;Vasopneumatic      Cryotherapy   Number Minutes Cryotherapy  10 Minutes    Cryotherapy Location  Shoulder    Type of Cryotherapy  Ice pack      Electrical Stimulation   Electrical Stimulation Location  R shoulder    Electrical Stimulation Action  Pre-Mod    Electrical Stimulation Parameters  80-150 hz x10 min    Electrical Stimulation Goals  Pain      Vasopneumatic   Number Minutes Vasopneumatic   10 minutes    Vasopnuematic Location   Ankle    Vasopneumatic Pressure  Medium    Vasopneumatic Temperature   34      Manual Therapy   Manual Therapy  Passive ROM    Passive ROM  PROM of R  shoulder into flexion, ER, gentle holds at end range      Ankle Exercises: Aerobic   Stationary Bike  L4 x2 x10 minutes      Ankle Exercises: Standing   Rocker Board  3 minutes    Heel Raises  15 reps    Toe Raise  15 reps    Other Standing Ankle Exercises  B hip flexion and abduction x20 reps each                  PT Long Term Goals - 08/23/17 0944      PT LONG TERM GOAL #1   Title  Pt will be independent with a HEP and progression.     Time  8    Period  Weeks    Status  Achieved      PT LONG TERM GOAL #2   Title  Pt will improve his R SLS to >/= 30 seconds to improve balance and functional mobility.     Time  8    Period  Weeks    Status  On-going      PT LONG TERM GOAL #3   Title  Walk with normal gait pattern with no pain reported.     Time  8    Period  Weeks    Status  On-going      PT LONG TERM GOAL #4   Title  Pt will improve his R shoulder flexion to >/= 150 degrees.     Time  8    Period  Weeks    Status  On-going R shoulder flexion PROM 115 deg 08/23/2017      PT LONG TERM GOAL #5   Title  Pt will improve his R grip strength to >/= 40 ppsi.     Time  8    Period  Weeks    Status  On-going            Plan - 08/23/17 1002    Clinical Impression Statement  Patient continues to experience mostly soreness in both R  ankle and shoulder. Patient able to tolerate ankle exercises although he reports deficits in strength as well as balance in LLE. Patient has noticed a deficit in muscle tone in B calves upon observation. B hip ROM exercises completed to strengthen for gait sequencing as well as to promote SLS in R ankle. Patient's R ankle ROM has improved and has good R ankle MMT. Patient's PROM R shoulder limited as well but patient is only 5 weeks out from R shoulder surgery as of 08/23/2017. Patient's R ankle still clearly swollen but patient compliant with compression sock use. Normal modalities response noted following removal of the modalities.      Rehab Potential  Good    Clinical Impairments Affecting Rehab Potential  only approved for 12 visits, due to multiple injuries pt may require additional visits    PT Frequency  3x / week    PT Duration  4 weeks    PT Treatment/Interventions  ADLs/Self Care Home Management;Cryotherapy;Electrical Stimulation;Therapeutic exercise;Therapeutic activities;Functional mobility training;Stair training;Gait training;Ultrasound;Balance training;Neuromuscular re-education;Patient/family education;Manual techniques;Passive range of motion;Taping    PT Next Visit Plan  RTC repair protocol, Ankle ROM/strengthening, gait training (MD 08/26/17 to both doctors)    PT [place] seated PROM shoulder external rotation.    Consulted and Agree with Plan of Care  Patient       Patient will benefit from skilled therapeutic intervention in order to improve the following deficits and impairments:  Abnormal gait, Pain, Increased edema, Decreased activity tolerance, Decreased strength, Decreased balance, Decreased mobility, Difficulty walking, Impaired UE functional use, Decreased range of motion  Visit Diagnosis: Acute pain of right shoulder  Stiffness of right shoulder, not elsewhere classified  Pain in right ankle and joints of right foot  Difficulty in walking, not elsewhere classified  Gait abnormality     Problem List Patient Active Problem List   Diagnosis Date Noted  . Diverticulitis of colon 05/03/2015    Standley Brooking, PTA 08/23/17 10:10 AM   [facility] [place], [place], [address] Phone: [phone]   Fax:  [phone]  Name: Granvel Proudfoot MRN: [id number] Date of Birth: May 02, 1966

## 2017-08-26 ENCOUNTER — Ambulatory Visit: Payer: Worker's Compensation | Admitting: Physical Therapy

## 2017-08-26 DIAGNOSIS — M25511 Pain in right shoulder: Secondary | ICD-10-CM | POA: Diagnosis not present

## 2017-08-26 DIAGNOSIS — M25571 Pain in right ankle and joints of right foot: Secondary | ICD-10-CM

## 2017-08-26 DIAGNOSIS — M25611 Stiffness of right shoulder, not elsewhere classified: Secondary | ICD-10-CM

## 2017-08-26 NOTE — Therapy (Signed)
[facility] [place], [place], [address] Phone: [phone]   Fax:  [phone]  Physical Therapy Treatment  Patient Details  Name: Aaron Krause MRN: [id number] Date of Birth: 11-30-1965 Referring Provider: Susa Day, MD   Encounter Date: 08/26/2017  PT End of Session - 08/26/17 1355    Visit Number  10    Number of Visits  12    Date for PT Re-Evaluation  09/06/17    Authorization Type  Pt has 12 visits approved from workers comp. Will need to reapply for further visits.     Authorization Time Period  1 month    Authorization - Visit Number  10    Authorization - Number of Visits  12    PT Start Time  1351    PT Stop Time  1442    PT Time Calculation (min)  51 min    Activity Tolerance  Patient tolerated treatment well    Behavior During Therapy  WFL for tasks assessed/performed       Past Medical History:  Diagnosis Date  . Arthritis   . Diverticulitis    with perforation reported  . Ruptured disk   . Trimalleolar fracture of right ankle     Past Surgical History:  Procedure Laterality Date  . ABDOMINAL SURGERY     diverticulitis perforation  . ORIF ANKLE FRACTURE Right 06/06/2017   Procedure: OPEN REDUCTION INTERNAL FIXATION (ORIF) ANKLE FRACTURE;  Surgeon: Wylene Simmer, MD;  Location: [facility];  Service: Orthopedics;  Laterality: Right;  . TOTAL HIP ARTHROPLASTY      There were no vitals filed for this visit.  Subjective Assessment - 08/26/17 1355    Subjective  Patient reports a lot of swelling in the ankle and R shoulder. He has pain in the shoulder with movement at shoulder height and above. He saw his MDs for his ankle and shoulder. He has a N.O. to continue PT for both.    Pertinent History  s/p right trimalleolar fx s/p surgery on 06/06/17, s/p R rotator cuff repair on 07/15/17.     Patient Stated Goals  get back to work    Currently in Pain?  Yes    Pain Score  2     Pain  Location  Ankle    Pain Orientation  Right    Pain Descriptors / Indicators  Sore    Pain Type  Surgical pain    Pain Score  2    Pain Location  Ankle    Pain Orientation  Right    Pain Descriptors / Indicators  Sore    Pain Type  Surgical pain    Pain Onset  More than a month ago                      [facility] Adult PT Treatment/Exercise - 08/26/17 0001      Shoulder Exercises: Pulleys   Flexion  3 minutes      Modalities   Modalities  Cryotherapy;Electrical Stimulation;Vasopneumatic      Cryotherapy   Number Minutes Cryotherapy  15 Minutes    Cryotherapy Location  Ankle    Type of Cryotherapy  Ice pack      Electrical Stimulation   Electrical Stimulation Location  R shoulder IFC 1-10 Hz x 15 min to tol    Electrical Stimulation Goals  Pain      Vasopneumatic   Number Minutes Vasopneumatic   15 minutes  Vasopnuematic Location   Shoulder    Vasopneumatic Pressure  Low    Vasopneumatic Temperature   34      Ankle Exercises: Aerobic   Stationary Bike  L4  x10 minutes      Ankle Exercises: Standing   Rocker Board  3 minutes    Heel Raises  15 reps    Toe Raise  15 reps    Other Standing Ankle Exercises  B hip flexion and abduction x20 reps each                  PT Long Term Goals - 08/23/17 0944      PT LONG TERM GOAL #1   Title  Pt will be independent with a HEP and progression.     Time  8    Period  Weeks    Status  Achieved      PT LONG TERM GOAL #2   Title  Pt will improve his R SLS to >/= 30 seconds to improve balance and functional mobility.     Time  8    Period  Weeks    Status  On-going      PT LONG TERM GOAL #3   Title  Walk with normal gait pattern with no pain reported.     Time  8    Period  Weeks    Status  On-going      PT LONG TERM GOAL #4   Title  Pt will improve his R shoulder flexion to >/= 150 degrees.     Time  8    Period  Weeks    Status  On-going R shoulder flexion PROM 115 deg 08/23/2017      PT LONG  TERM GOAL #5   Title  Pt will improve his R grip strength to >/= 40 ppsi.     Time  8    Period  Weeks    Status  On-going            Plan - 08/26/17 1518    Clinical Impression Statement  Patient reports his doctors want him to continue therapy and has N.O. which he will bring in. He was advised to decrease frequency of shoulder HEP and stop at pain not into pain. He reports he has not eased up on HEP as advised by PT.     PT Treatment/Interventions  ADLs/Self Care Home Management;Cryotherapy;Electrical Stimulation;Therapeutic exercise;Therapeutic activities;Functional mobility training;Stair training;Gait training;Ultrasound;Balance training;Neuromuscular re-education;Patient/family education;Manual techniques;Passive range of motion;Taping    PT Next Visit Plan  RTC repair protocol, Ankle ROM/strengthening, gait training  (see new order)       Patient will benefit from skilled therapeutic intervention in order to improve the following deficits and impairments:  Abnormal gait, Pain, Increased edema, Decreased activity tolerance, Decreased strength, Decreased balance, Decreased mobility, Difficulty walking, Impaired UE functional use, Decreased range of motion  Visit Diagnosis: Acute pain of right shoulder  Stiffness of right shoulder, not elsewhere classified  Pain in right ankle and joints of right foot     Problem List Patient Active Problem List   Diagnosis Date Noted  . Diverticulitis of colon 05/03/2015    Madelyn Flavors PT 08/26/2017, 3:21 PM  [facility] [address] [place], [place], [address] Phone: [phone]   Fax:  [phone]  Name: Aaron Krause MRN: [id number] Date of Birth: Jul 13, 1966

## 2017-08-28 ENCOUNTER — Ambulatory Visit: Payer: Worker's Compensation | Admitting: Physical Therapy

## 2017-08-28 DIAGNOSIS — M25511 Pain in right shoulder: Secondary | ICD-10-CM | POA: Diagnosis not present

## 2017-08-28 DIAGNOSIS — M25571 Pain in right ankle and joints of right foot: Secondary | ICD-10-CM

## 2017-08-28 DIAGNOSIS — M25611 Stiffness of right shoulder, not elsewhere classified: Secondary | ICD-10-CM

## 2017-08-28 NOTE — Therapy (Signed)
[facility], [place], [address] Phone: [phone]   Fax:  [phone]  Physical Therapy Treatment  Patient Details  Name: Aaron Krause MRN: [id number] Date of Birth: 1966-03-23 Referring Provider: Susa Day, MD   Encounter Date: 08/28/2017  PT End of Session - 08/28/17 0823    Visit Number  11    Number of Visits  12    Date for PT Re-Evaluation  09/06/17    Authorization Type  Pt has 12 visits approved from workers comp. Will need to reapply for further visits.     Authorization Time Period  1 month    Authorization - Visit Number  10    Authorization - Number of Visits  12    PT Start Time  [phone]    PT Stop Time  0918    PT Time Calculation (min)  55 min    Activity Tolerance  Patient tolerated treatment well    Behavior During Therapy  [facility] for tasks assessed/performed       Past Medical History:  Diagnosis Date  . Arthritis   . Diverticulitis    with perforation reported  . Ruptured disk   . Trimalleolar fracture of right ankle     Past Surgical History:  Procedure Laterality Date  . ABDOMINAL SURGERY     diverticulitis perforation  . ORIF ANKLE FRACTURE Right 06/06/2017   Procedure: OPEN REDUCTION INTERNAL FIXATION (ORIF) ANKLE FRACTURE;  Surgeon: Wylene Simmer, MD;  Location: [place];  Service: Orthopedics;  Laterality: Right;  . TOTAL HIP ARTHROPLASTY      There were no vitals filed for this visit.  Subjective Assessment - 08/28/17 0825    Subjective  Patient reported both anle and shoulder are "feeling good". Patient unable to report pain scale, just discomfort.    Pertinent History  s/p right trimalleolar fx s/p surgery on 06/06/17, s/p R rotator cuff repair on 07/15/17.     Patient Stated Goals  get back to work                      [facility] Adult PT Treatment/Exercise - 08/28/17 0001      Shoulder Exercises: Standing   Other Standing Exercises  UE  ranger flexion 5 minutes      Modalities   Modalities  Vasopneumatic;Electrical Stimulation      Electrical Stimulation   Electrical Stimulation Location  R shoulder    Electrical Stimulation Action  IFC    Electrical Stimulation Parameters  80-150 Hz x10 minutes    Electrical Stimulation Goals  Pain      Vasopneumatic   Number Minutes Vasopneumatic   10 minutes    Vasopnuematic Location   Ankle;Shoulder    Vasopneumatic Pressure  Medium    Vasopneumatic Temperature   34      Manual Therapy   Manual Therapy  Joint mobilization;Passive ROM    Manual therapy comments  right shoulder, flexion and ER    Joint Mobilization  Grade II R shoulder posterior and inferior glides and distraction with oscillations      Ankle Exercises: Aerobic   Stationary Bike  L5  x10 minutes      Ankle Exercises: Standing   SLS  Narrow BOS, on foam 3x30" with PRN UE spport, eyes closed    Other Standing Ankle Exercises  R forward lunges onto dynadisc with UE support 2x10  PT Long Term Goals - 08/23/17 0944      PT LONG TERM GOAL #1   Title  Pt will be independent with a HEP and progression.     Time  8    Period  Weeks    Status  Achieved      PT LONG TERM GOAL #2   Title  Pt will improve his R SLS to >/= 30 seconds to improve balance and functional mobility.     Time  8    Period  Weeks    Status  On-going      PT LONG TERM GOAL #3   Title  Walk with normal gait pattern with no pain reported.     Time  8    Period  Weeks    Status  On-going      PT LONG TERM GOAL #4   Title  Pt will improve his R shoulder flexion to >/= 150 degrees.     Time  8    Period  Weeks    Status  On-going R shoulder flexion PROM 115 deg 08/23/2017      PT LONG TERM GOAL #5   Title  Pt will improve his R grip strength to >/= 40 ppsi.     Time  8    Period  Weeks    Status  On-going            Plan - 08/28/17 0940    Clinical Impression Statement  Patient tolerated new  balance exercises despite R lateral ankle soreness. Patient noted with increased R inversion with balance and forward lunges. Patient's ankle adjusted to subtalar neutral and patient reported "tingling" on lateral ankle. Patient noted his shoes normally wear along the lateral aspect of the shoe. Patient reported decreased pain after vasopneumatic and E-stim. Normal responses noted upon removal of modalities.    Rehab Potential  Good    Clinical Impairments Affecting Rehab Potential  only approved for 12 visits, due to multiple injuries pt may require additional visits    PT Frequency  3x / week    PT Duration  4 weeks    PT Treatment/Interventions  ADLs/Self Care Home Management;Cryotherapy;Electrical Stimulation;Therapeutic exercise;Therapeutic activities;Functional mobility training;Stair training;Gait training;Ultrasound;Balance training;Neuromuscular re-education;Patient/family education;Manual techniques;Passive range of motion;Taping    PT Next Visit Plan  Reassess goal and update appropriately. RTC repair protocol, Ankle ROM/strengthening, gait training  (see new order)    Consulted and Agree with Plan of Care  Patient       Patient will benefit from skilled therapeutic intervention in order to improve the following deficits and impairments:  Abnormal gait, Pain, Increased edema, Decreased activity tolerance, Decreased strength, Decreased balance, Decreased mobility, Difficulty walking, Impaired UE functional use, Decreased range of motion  Visit Diagnosis: Acute pain of right shoulder  Stiffness of right shoulder, not elsewhere classified  Pain in right ankle and joints of right foot     Problem List Patient Active Problem List   Diagnosis Date Noted  . Diverticulitis of colon 05/03/2015   Aaron Krause, PT, DPT 08/28/2017, 10:12 AM  [facility] [address] [place], [place], [address] Phone: [phone]   Fax:   [phone]  Name: Aaron Krause MRN: [id number] Date of Birth: 06-12-1966

## 2017-08-30 ENCOUNTER — Ambulatory Visit: Payer: Worker's Compensation | Attending: Specialist | Admitting: Physical Therapy

## 2017-08-30 DIAGNOSIS — R5381 Other malaise: Secondary | ICD-10-CM | POA: Diagnosis present

## 2017-08-30 DIAGNOSIS — M25611 Stiffness of right shoulder, not elsewhere classified: Secondary | ICD-10-CM | POA: Insufficient documentation

## 2017-08-30 DIAGNOSIS — R269 Unspecified abnormalities of gait and mobility: Secondary | ICD-10-CM | POA: Diagnosis present

## 2017-08-30 DIAGNOSIS — M25571 Pain in right ankle and joints of right foot: Secondary | ICD-10-CM | POA: Diagnosis present

## 2017-08-30 DIAGNOSIS — M25511 Pain in right shoulder: Secondary | ICD-10-CM | POA: Insufficient documentation

## 2017-08-30 DIAGNOSIS — M25651 Stiffness of right hip, not elsewhere classified: Secondary | ICD-10-CM | POA: Insufficient documentation

## 2017-08-30 DIAGNOSIS — M25551 Pain in right hip: Secondary | ICD-10-CM | POA: Insufficient documentation

## 2017-08-30 DIAGNOSIS — R262 Difficulty in walking, not elsewhere classified: Secondary | ICD-10-CM | POA: Diagnosis present

## 2017-08-30 NOTE — Therapy (Signed)
[facility] [place], [place], [address] Phone: [phone]   Fax:  [phone]  Physical Therapy Treatment  Patient Details  Name: Aaron Krause MRN: [id number] Date of Birth: August 27, 1965 Referring Provider: Susa Day, MD   Encounter Date: 08/30/2017  PT End of Session - 08/30/17 0829    Visit Number  12    Number of Visits  24    Date for PT Re-Evaluation  10/04/17    Authorization Type  Pt has 12 visits approved from workers comp. Will need to reapply for further visits.     Authorization Time Period  1 month    Authorization - Visit Number  12    Authorization - Number of Visits  24    PT Start Time  0818    PT Stop Time  0916    PT Time Calculation (min)  58 min    Activity Tolerance  Patient tolerated treatment well    Behavior During Therapy  [facility] for tasks assessed/performed       Past Medical History:  Diagnosis Date  . Arthritis   . Diverticulitis    with perforation reported  . Ruptured disk   . Trimalleolar fracture of right ankle     Past Surgical History:  Procedure Laterality Date  . ABDOMINAL SURGERY     diverticulitis perforation  . ORIF ANKLE FRACTURE Right 06/06/2017   Procedure: OPEN REDUCTION INTERNAL FIXATION (ORIF) ANKLE FRACTURE;  Surgeon: Wylene Simmer, MD;  Location: [place];  Service: Orthopedics;  Laterality: Right;  . TOTAL HIP ARTHROPLASTY      There were no vitals filed for this visit.                   [facility] Adult PT Treatment/Exercise - 08/30/17 0001      Shoulder Exercises: Standing   External Rotation  AAROM;Right stepping towards UE ranger, 3 minutes    Flexion  AAROM;Right Stepping into UE ranger for flexion 3 minutes      Modalities   Modalities  Vasopneumatic;Electrical Stimulation      Electrical Stimulation   Electrical Stimulation Location  R shoulder    Electrical Stimulation Action  IFC    Electrical Stimulation Parameters   80-150 Hz x10    Electrical Stimulation Goals  Pain      Vasopneumatic   Number Minutes Vasopneumatic   10 minutes    Vasopnuematic Location   Ankle;Shoulder    Vasopneumatic Pressure  Medium    Vasopneumatic Temperature   34      Manual Therapy   Manual Therapy  Joint mobilization;Passive ROM    Manual therapy comments  right shoulder, flexion and ER    Joint Mobilization  Grade II R shoulder posterior and inferior glides and distraction with oscillations      Ankle Exercises: Standing   SLS  SLS with occasional 1 UE support 3x15" on floor.    Other Standing Ankle Exercises  Step ups 30x no UE support      Ankle Exercises: Stretches   Soleus Stretch  3 reps;30 seconds    Gastroc Stretch  3 reps;30 seconds                  PT Long Term Goals - 08/23/17 0944      PT LONG TERM GOAL #1   Title  Pt will be independent with a HEP and progression.     Time  8    Period  Weeks    Status  Achieved      PT LONG TERM GOAL #2   Title  Pt will improve his R SLS to >/= 30 seconds to improve balance and functional mobility.     Time  8    Period  Weeks    Status  On-going      PT LONG TERM GOAL #3   Title  Walk with normal gait pattern with no pain reported.     Time  8    Period  Weeks    Status  On-going      PT LONG TERM GOAL #4   Title  Pt will improve his R shoulder flexion to >/= 150 degrees.     Time  8    Period  Weeks    Status  On-going R shoulder flexion PROM 115 deg 08/23/2017      PT LONG TERM GOAL #5   Title  Pt will improve his R grip strength to >/= 40 ppsi.     Time  8    Period  Weeks    Status  On-going            Plan - 08/30/17 0940    Clinical Impression Statement  Patient was able to complete balance exercises despite increased soreness in R ankle. Attempted eccentric step downs however pt unable to perform with pelvis stabilized, forward step ups performed instead. Patient was provided with adequate rest breaks to relieve R ankle  soreness. Patient noted with shoulder elevation with AAROM ER and shoulder flexion with ranger; patient able to complete with improved form with cuing. Patient had difficulty with AAROM scaption in supine with PVC pipe due to the inability to hold up weight of the arm. PT guided R arm through ROM which allowed pt to go through available range. Normal responses noted upon removal of modalities. Patient brought in new referral Workers compensation approved 12 additional visits; 3x per week for 4 weeks.    Clinical Presentation  Stable    Clinical Decision Making  Moderate    Rehab Potential  Good    PT Frequency  3x / week    PT Duration  4 weeks    PT Treatment/Interventions  ADLs/Self Care Home Management;Cryotherapy;Electrical Stimulation;Therapeutic exercise;Therapeutic activities;Functional mobility training;Stair training;Gait training;Ultrasound;Balance training;Neuromuscular re-education;Patient/family education;Manual techniques;Passive range of motion;Taping    PT Next Visit Plan  Reassess goal and update appropriately. RTC repair protocol, Ankle ROM/strengthening, gait training  (see new order)    Consulted and Agree with Plan of Care  Patient       Patient will benefit from skilled therapeutic intervention in order to improve the following deficits and impairments:  Abnormal gait, Pain, Increased edema, Decreased activity tolerance, Decreased strength, Decreased balance, Decreased mobility, Difficulty walking, Impaired UE functional use, Decreased range of motion  Visit Diagnosis: Acute pain of right shoulder  Stiffness of right shoulder, not elsewhere classified  Pain in right ankle and joints of right foot  Difficulty in walking, not elsewhere classified  Gait abnormality     Problem List Patient Active Problem List   Diagnosis Date Noted  . Diverticulitis of colon 05/03/2015   Gabriela Eves, PT, DPT 08/30/2017, 10:09 AM  [facility] [address] [place], [place], [address] Phone: [phone]   Fax:  [phone]  Name: Aaron Krause MRN: [id number] Date of Birth: [birth year]

## 2017-09-02 ENCOUNTER — Ambulatory Visit: Payer: Worker's Compensation | Admitting: Physical Therapy

## 2017-09-02 ENCOUNTER — Encounter: Payer: Self-pay | Admitting: Physical Therapy

## 2017-09-02 DIAGNOSIS — M25511 Pain in right shoulder: Secondary | ICD-10-CM | POA: Diagnosis not present

## 2017-09-02 DIAGNOSIS — R262 Difficulty in walking, not elsewhere classified: Secondary | ICD-10-CM

## 2017-09-02 DIAGNOSIS — M25571 Pain in right ankle and joints of right foot: Secondary | ICD-10-CM

## 2017-09-02 DIAGNOSIS — M25611 Stiffness of right shoulder, not elsewhere classified: Secondary | ICD-10-CM

## 2017-09-02 DIAGNOSIS — R269 Unspecified abnormalities of gait and mobility: Secondary | ICD-10-CM

## 2017-09-02 NOTE — Therapy (Signed)
[facility], [place], [address] Phone: [phone]   Fax:  [phone]  Physical Therapy Treatment  Patient Details  Name: Aaron Krause MRN: [id number] Date of Birth: [birth year] Referring Provider: Susa Day, MD   Encounter Date: 09/02/2017  PT End of Session - 09/02/17 0943    Visit Number  13    Number of Visits  24    Date for PT Re-Evaluation  10/04/17    Authorization Time Period  1 month    Authorization - Visit Number  12    Authorization - Number of Visits  24    PT Start Time  0904    PT Stop Time  0959    PT Time Calculation (min)  55 min    Activity Tolerance  Patient tolerated treatment well    Behavior During Therapy  [facility] for tasks assessed/performed       Past Medical History:  Diagnosis Date  . Arthritis   . Diverticulitis    with perforation reported  . Ruptured disk   . Trimalleolar fracture of right ankle     Past Surgical History:  Procedure Laterality Date  . ABDOMINAL SURGERY     diverticulitis perforation  . ORIF ANKLE FRACTURE Right 06/06/2017   Procedure: OPEN REDUCTION INTERNAL FIXATION (ORIF) ANKLE FRACTURE;  Surgeon: Wylene Simmer, MD;  Location: [place];  Service: Orthopedics;  Laterality: Right;  . TOTAL HIP ARTHROPLASTY      There were no vitals filed for this visit.  Subjective Assessment - 09/02/17 0908    Subjective  Patient reported overall progress just ongoing discomfort in shoulder, no pain complaints in ankle    Pertinent History  s/p right trimalleolar fx s/p surgery on 06/06/17, s/p R rotator cuff repair on 07/15/17.     Patient Stated Goals  get back to work    Currently in Pain?  Yes    Pain Score  0-No pain    Pain Location  Ankle    Pain Orientation  Right    Pain Onset  More than a month ago    Multiple Pain Sites  Yes    Pain Score  1    Pain Location  Shoulder    Pain Orientation  Right    Pain Descriptors / Indicators  Sore     Pain Type  Surgical pain    Pain Onset  More than a month ago    Pain Frequency  Constant    Aggravating Factors   certain movements    Pain Relieving Factors  at rest         [facility] PT Assessment - 09/02/17 0001      PROM   Overall PROM Comments  AAROM with cane    PROM Assessment Site  Shoulder    Right/Left Shoulder  Right    Right Shoulder Flexion  140 Degrees    Right Shoulder External Rotation  60 Degrees      Special Tests   Other special tests  grip 95#LT/65#RT                  [facility] Adult PT Treatment/Exercise - 09/02/17 0001      Shoulder Exercises: Supine   External Rotation  AAROM;10 reps;Other (comment) cane    Flexion  AAROM;10 reps cane      Shoulder Exercises: Standing   Other Standing Exercises  UE ranger flexion 5 minutes      Electrical Stimulation  Electrical Stimulation Location  R shoulder    Chartered certified accountant  IFC    Electrical Stimulation Parameters  80-_0 x50mn    Electrical Stimulation Goals  Pain      Vasopneumatic   Number Minutes Vasopneumatic   10 minutes    Vasopnuematic Location   Shoulder    Vasopneumatic Pressure  Medium      Ankle Exercises: Aerobic   Stationary Bike  Nustep L6 x130m LE only      Ankle Exercises: Standing   BAPS  Level 1;Sitting;10 reps bil directions    SLS  SLS with occasional 1 UE support 3x15" on floor.    Heel Raises  10 reps    Toe Raise  10 reps    Other Standing Ankle Exercises  4"step up 2x10                  PT Long Term Goals - 09/02/17 0945      PT LONG TERM GOAL #1   Title  Pt will be independent with a HEP and progression.     Time  8    Period  Weeks    Status  Achieved      PT LONG TERM GOAL #2   Title  Pt will improve his R SLS to >/= 30 seconds to improve balance and functional mobility.     Time  8    Period  Weeks    Status  On-going      PT LONG TERM GOAL #3   Title  Walk with normal gait pattern with no pain reported.     Time  8     Period  Weeks    Status  On-going      PT LONG TERM GOAL #4   Title  Pt will improve his R shoulder flexion to >/= 150 degrees.     Time  8    Period  Weeks    Status  On-going P/AAROM 140 degrees 09/02/17      PT LONG TERM GOAL #5   Title  Pt will improve his R grip strength to >/= 40 ppsi.     Time  8    Period  Weeks    Status  Achieved 30# difference 09/02/17            Plan - 09/02/17 0949    Clinical Impression Statement  Patient tolerated treatment well today. Patient has some ongoing discomfort in shoulder only with use and only difficulty with right ankle balance and inversion movement. Patientable to progress exercises today and has improved with grip strength today. Patient has improved with AAROM for right shoulder flexion and ER today. Patient met LTG #5 and others ongoing. Patient was given red putty for HEP.     Rehab Potential  Good    PT Frequency  3x / week    PT Duration  4 weeks    PT Treatment/Interventions  ADLs/Self Care Home Management;Cryotherapy;Electrical Stimulation;Therapeutic exercise;Therapeutic activities;Functional mobility training;Stair training;Gait training;Ultrasound;Balance training;Neuromuscular re-education;Patient/family education;Manual techniques;Passive range of motion;Taping    PT Next Visit Plan   RTC repair protocol, Ankle ROM/strengthening, gait training  (see new order)    Consulted and Agree with Plan of Care  Patient       Patient will benefit from skilled therapeutic intervention in order to improve the following deficits and impairments:  Abnormal gait, Pain, Increased edema, Decreased activity tolerance, Decreased strength, Decreased balance, Decreased mobility, Difficulty walking, Impaired UE functional use, Decreased range  of motion  Visit Diagnosis: Acute pain of right shoulder  Stiffness of right shoulder, not elsewhere classified  Pain in right ankle and joints of right foot  Difficulty in walking, not elsewhere  classified  Gait abnormality     Problem List Patient Active Problem List   Diagnosis Date Noted  . Diverticulitis of colon 05/03/2015    Phillips Climes, PTA 09/02/2017, 10:00 AM  [facility] [place], [place], [address] Phone: [phone]   Fax:  [phone]  Name: Husayn Reim MRN: [id number] Date of Birth: April 04, 1966

## 2017-09-03 ENCOUNTER — Encounter: Payer: Self-pay | Admitting: Physical Therapy

## 2017-09-04 ENCOUNTER — Encounter: Payer: Self-pay | Admitting: Physical Therapy

## 2017-09-04 ENCOUNTER — Ambulatory Visit: Payer: Worker's Compensation | Admitting: Physical Therapy

## 2017-09-04 DIAGNOSIS — R262 Difficulty in walking, not elsewhere classified: Secondary | ICD-10-CM

## 2017-09-04 DIAGNOSIS — R269 Unspecified abnormalities of gait and mobility: Secondary | ICD-10-CM

## 2017-09-04 DIAGNOSIS — M25571 Pain in right ankle and joints of right foot: Secondary | ICD-10-CM

## 2017-09-04 DIAGNOSIS — M25511 Pain in right shoulder: Secondary | ICD-10-CM | POA: Diagnosis not present

## 2017-09-04 DIAGNOSIS — M25611 Stiffness of right shoulder, not elsewhere classified: Secondary | ICD-10-CM

## 2017-09-04 NOTE — Therapy (Signed)
[facility] [place], [place], [address] Phone: [phone]   Fax:  [phone]  Physical Therapy Treatment  Patient Details  Name: Aaron Krause MRN: [id number] Date of Birth: 1965/09/18 Referring Provider: Susa Day, MD   Encounter Date: 09/04/2017  PT End of Session - 09/04/17 1037    Visit Number  14    Number of Visits  24    Date for PT Re-Evaluation  10/04/17    Authorization - Visit Number  12    Authorization - Number of Visits  24    PT Start Time  0946    PT Stop Time  1045    PT Time Calculation (min)  59 min    Activity Tolerance  Patient tolerated treatment well    Behavior During Therapy  [facility] for tasks assessed/performed       Past Medical History:  Diagnosis Date  . Arthritis   . Diverticulitis    with perforation reported  . Ruptured disk   . Trimalleolar fracture of right ankle     Past Surgical History:  Procedure Laterality Date  . ABDOMINAL SURGERY     diverticulitis perforation  . ORIF ANKLE FRACTURE Right 06/06/2017   Procedure: OPEN REDUCTION INTERNAL FIXATION (ORIF) ANKLE FRACTURE;  Surgeon: Wylene Simmer, MD;  Location: [place];  Service: Orthopedics;  Laterality: Right;  . TOTAL HIP ARTHROPLASTY      There were no vitals filed for this visit.  Subjective Assessment - 09/04/17 0953    Subjective  Patient reported overall progress with ongoing weakness in ankle and discomfort in shoulder    Pertinent History  s/p right trimalleolar fx s/p surgery on 06/06/17, s/p R rotator cuff repair on 07/15/17.     Patient Stated Goals  get back to work    Currently in Pain?  Yes    Pain Score  0-No pain    Pain Location  Ankle    Pain Orientation  Right    Pain Type  Surgical pain    Pain Onset  More than a month ago    Pain Score  1    Pain Location  Shoulder    Pain Orientation  Right    Pain Descriptors / Indicators  Sore    Pain Onset  More than a month ago    Pain  Frequency  Constant    Aggravating Factors   certain movements    Pain Relieving Factors  rest                      [facility] Adult PT Treatment/Exercise - 09/04/17 0001      Shoulder Exercises: Standing   Other Standing Exercises  UE ranger flexion 5 minutes      Electrical Stimulation   Electrical Stimulation Location  R shoulder    Electrical Stimulation Action  IFC    Electrical Stimulation Parameters  80-150hz  x38min    Electrical Stimulation Goals  Pain      Vasopneumatic   Number Minutes Vasopneumatic   15 minutes    Vasopnuematic Location   Shoulder    Vasopneumatic Pressure  Medium      Manual Therapy   Manual Therapy  Passive ROM    Passive ROM  gentle manual PAAROM for       Ankle Exercises: Aerobic   Stationary Bike  Nustep L6 x48min LE only      Ankle Exercises: Seated   Other Seated Ankle Exercises  ankle isolator 1# 2x15, inversion/eversion 1/2# 2x15 each      Ankle Exercises: Standing   Heel Raises  10 reps    Toe Raise  10 reps    Other Standing Ankle Exercises  balance on airex with Narrow BOS                  PT Long Term Goals - 09/02/17 0945      PT LONG TERM GOAL #1   Title  Pt will be independent with a HEP and progression.     Time  8    Period  Weeks    Status  Achieved      PT LONG TERM GOAL #2   Title  Pt will improve his R SLS to >/= 30 seconds to improve balance and functional mobility.     Time  8    Period  Weeks    Status  On-going      PT LONG TERM GOAL #3   Title  Walk with normal gait pattern with no pain reported.     Time  8    Period  Weeks    Status  On-going      PT LONG TERM GOAL #4   Title  Pt will improve his R shoulder flexion to >/= 150 degrees.     Time  8    Period  Weeks    Status  On-going P/AAROM 140 degrees 09/02/17      PT LONG TERM GOAL #5   Title  Pt will improve his R grip strength to >/= 40 ppsi.     Time  8    Period  Weeks    Status  Achieved 30# difference 09/02/17             Plan - 09/04/17 1038    Clinical Impression Statement  Patient tolerated treatment well today. Patient has some ongoing discomfort in shoulder which is minimal per reported. Patient has no pain in ankle just feeling weakness. Patient able to progress with ankle strengthening today with no reported disccomfort. Patient continues to do well with P/AAROM manual and with excercises per MD order. Goals ongoing.     Rehab Potential  Good    PT Frequency  3x / week    PT Duration  4 weeks    PT Next Visit Plan   RTC repair protocol, Ankle ROM/strengthening, gait training  (see new order)    Consulted and Agree with Plan of Care  Patient       Patient will benefit from skilled therapeutic intervention in order to improve the following deficits and impairments:  Abnormal gait, Pain, Increased edema, Decreased activity tolerance, Decreased strength, Decreased balance, Decreased mobility, Difficulty walking, Impaired UE functional use, Decreased range of motion  Visit Diagnosis: Acute pain of right shoulder  Stiffness of right shoulder, not elsewhere classified  Pain in right ankle and joints of right foot  Difficulty in walking, not elsewhere classified  Gait abnormality     Problem List Patient Active Problem List   Diagnosis Date Noted  . Diverticulitis of colon 05/03/2015    Phillips Climes, PTA 09/04/2017, 11:06 AM  [facility] [place], [place], [address] Phone: [phone]   Fax:  [phone]  Name: Aaron Krause MRN: [id number] Date of Birth: [birth year]

## 2017-09-05 ENCOUNTER — Encounter: Payer: Self-pay | Admitting: Physical Therapy

## 2017-09-06 ENCOUNTER — Ambulatory Visit: Payer: Worker's Compensation | Admitting: Physical Therapy

## 2017-09-06 DIAGNOSIS — R262 Difficulty in walking, not elsewhere classified: Secondary | ICD-10-CM

## 2017-09-06 DIAGNOSIS — R269 Unspecified abnormalities of gait and mobility: Secondary | ICD-10-CM

## 2017-09-06 DIAGNOSIS — M25611 Stiffness of right shoulder, not elsewhere classified: Secondary | ICD-10-CM

## 2017-09-06 DIAGNOSIS — M25511 Pain in right shoulder: Secondary | ICD-10-CM

## 2017-09-06 DIAGNOSIS — M25571 Pain in right ankle and joints of right foot: Secondary | ICD-10-CM

## 2017-09-06 NOTE — Therapy (Signed)
[facility] [place], [place], [address] Phone: [phone]   Fax:  [phone]  Physical Therapy Treatment  Patient Details  Name: Aaron Krause MRN: [id number] Date of Birth: Aug 24, 1965 Referring Provider: Susa Day, MD   Encounter Date: 09/06/2017  PT End of Session - 09/06/17 0833    Visit Number  15    Number of Visits  24    Date for PT Re-Evaluation  10/04/17    Authorization Type  Pt has 12 visits approved from workers comp. Will need to reapply for further visits.     Authorization Time Period  1 month    Authorization - Visit Number  13    Authorization - Number of Visits  24    PT Start Time  0827    PT Stop Time  0915    PT Time Calculation (min)  48 min    Activity Tolerance  Patient tolerated treatment well    Behavior During Therapy  WFL for tasks assessed/performed       Past Medical History:  Diagnosis Date  . Arthritis   . Diverticulitis    with perforation reported  . Ruptured disk   . Trimalleolar fracture of right ankle     Past Surgical History:  Procedure Laterality Date  . ABDOMINAL SURGERY     diverticulitis perforation  . ORIF ANKLE FRACTURE Right 06/06/2017   Procedure: OPEN REDUCTION INTERNAL FIXATION (ORIF) ANKLE FRACTURE;  Surgeon: Wylene Simmer, MD;  Location: [place];  Service: Orthopedics;  Laterality: Right;  . TOTAL HIP ARTHROPLASTY      There were no vitals filed for this visit.  Subjective Assessment - 09/06/17 0829    Subjective  Patient reports feeling overall pretty good but the ankle is sore from being up on the feet all day yesterday running errands. At rest shouler is 0/10 once movement occurs, starts to feel sore.    Pertinent History  s/p right trimalleolar fx s/p surgery on 06/06/17, s/p R rotator cuff repair on 07/15/17.     Patient Stated Goals  get back to work    Currently in Pain?  Yes    Pain Score  1     Pain Location  Ankle    Pain  Orientation  Right    Pain Descriptors / Indicators  Sore    Pain Type  Surgical pain    Pain Onset  More than a month ago    Pain Score  0    Pain Location  Shoulder    Pain Onset  More than a month ago                      [facility] Adult PT Treatment/Exercise - 09/06/17 0001      Shoulder Exercises: Standing   External Rotation  --    Retraction  AROM;20 reps no weights    Other Standing Exercises  Finger ladder with 3 second hold at end range x5 minutes started at 31- finished at 35    Other Standing Exercises  wall washing, flexion and half circles x3 minutes      Electrical Stimulation   Electrical Stimulation Location  R shoulder    Electrical Stimulation Action  IFC    Electrical Stimulation Parameters  80-150 Hz x10    Electrical Stimulation Goals  Pain      Vasopneumatic   Number Minutes Vasopneumatic   10 minutes    Vasopnuematic Location  Shoulder;Ankle    Vasopneumatic Pressure  Medium      Ankle Exercises: Sidelying   Ankle Inversion  AROM;Right;20 reps;Weights ankle isolator    Ankle Eversion  AROM;Right;20 reps ankle isolator      Ankle Exercises: Aerobic   Stationary Bike  Nustep L6 x83min LE only      Ankle Exercises: Stretches   Gastroc Stretch  3 reps;30 seconds on rocker board                  PT Long Term Goals - 09/02/17 0945      PT LONG TERM GOAL #1   Title  Pt will be independent with a HEP and progression.     Time  8    Period  Weeks    Status  Achieved      PT LONG TERM GOAL #2   Title  Pt will improve his R SLS to >/= 30 seconds to improve balance and functional mobility.     Time  8    Period  Weeks    Status  On-going      PT LONG TERM GOAL #3   Title  Walk with normal gait pattern with no pain reported.     Time  8    Period  Weeks    Status  On-going      PT LONG TERM GOAL #4   Title  Pt will improve his R shoulder flexion to >/= 150 degrees.     Time  8    Period  Weeks    Status  On-going  P/AAROM 140 degrees 09/02/17      PT LONG TERM GOAL #5   Title  Pt will improve his R grip strength to >/= 40 ppsi.     Time  8    Period  Weeks    Status  Achieved 30# difference 09/02/17            Plan - 09/06/17 0907    Clinical Impression Statement  Patient demonstrated very good form with ankle isolator in/ev. Still reporting soreness in shoulder and ankle during AROM. Patient instructed to perform wall washes (flexion and circles) at home to improve AROM. Next visit continue AROM of R shoulder per protocol. No adverse affects noted upon removal of modalities.    Clinical Presentation  Stable    Clinical Decision Making  Moderate    Rehab Potential  Good    Clinical Impairments Affecting Rehab Potential  only approved for 12 visits, due to multiple injuries pt may require additional visits    PT Frequency  3x / week    PT Duration  4 weeks    PT Treatment/Interventions  ADLs/Self Care Home Management;Cryotherapy;Electrical Stimulation;Therapeutic exercise;Therapeutic activities;Functional mobility training;Stair training;Gait training;Ultrasound;Balance training;Neuromuscular re-education;Patient/family education;Manual techniques;Passive range of motion;Taping    PT Next Visit Plan   RTC repair protocol-> AROM strengthening begin week of 2/28, Ankle ROM/strengthening, gait training  (see new order)    Consulted and Agree with Plan of Care  Patient       Patient will benefit from skilled therapeutic intervention in order to improve the following deficits and impairments:  Abnormal gait, Pain, Increased edema, Decreased activity tolerance, Decreased strength, Decreased balance, Decreased mobility, Difficulty walking, Impaired UE functional use, Decreased range of motion  Visit Diagnosis: Acute pain of right shoulder  Stiffness of right shoulder, not elsewhere classified  Pain in right ankle and joints of right foot  Difficulty in walking, not elsewhere classified  Gait  abnormality     Problem List Patient Active Problem List   Diagnosis Date Noted  . Diverticulitis of colon 05/03/2015   Gabriela Eves, PT, DPT 09/06/2017, 10:03 AM  [facility] [address] [place], [place], [address] Phone: [phone]   Fax:  [phone]  Name: Aaron Krause MRN: [id number] Date of Birth: [birth year]

## 2017-09-09 ENCOUNTER — Encounter: Payer: Self-pay | Admitting: Physical Therapy

## 2017-09-10 ENCOUNTER — Encounter: Payer: Self-pay | Admitting: Physical Therapy

## 2017-09-11 ENCOUNTER — Ambulatory Visit: Payer: Worker's Compensation | Admitting: Physical Therapy

## 2017-09-11 ENCOUNTER — Encounter: Payer: Self-pay | Admitting: Physical Therapy

## 2017-09-11 DIAGNOSIS — R262 Difficulty in walking, not elsewhere classified: Secondary | ICD-10-CM

## 2017-09-11 DIAGNOSIS — M25511 Pain in right shoulder: Secondary | ICD-10-CM

## 2017-09-11 DIAGNOSIS — M25571 Pain in right ankle and joints of right foot: Secondary | ICD-10-CM

## 2017-09-11 DIAGNOSIS — M25611 Stiffness of right shoulder, not elsewhere classified: Secondary | ICD-10-CM

## 2017-09-11 DIAGNOSIS — R269 Unspecified abnormalities of gait and mobility: Secondary | ICD-10-CM

## 2017-09-11 NOTE — Therapy (Signed)
[facility] [place], [place], [address] Phone: [phone]   Fax:  [phone]  Physical Therapy Treatment  Patient Details  Name: Aaron Krause MRN: [id number] Date of Birth: [birth year] Referring Provider: Susa Day, MD   Encounter Date: 09/11/2017  PT End of Session - 09/11/17 0853    Visit Number  16    Number of Visits  24    Date for PT Re-Evaluation  10/04/17    Authorization Type  Pt has 12 visits approved from workers comp. Will need to reapply for further visits.     Authorization Time Period  1 month    Authorization - Visit Number  13    Authorization - Number of Visits  24    PT Start Time  0903    PT Stop Time  0958    PT Time Calculation (min)  55 min    Activity Tolerance  Patient tolerated treatment well    Behavior During Therapy  [facility] for tasks assessed/performed       Past Medical History:  Diagnosis Date  . Arthritis   . Diverticulitis    with perforation reported  . Ruptured disk   . Trimalleolar fracture of right ankle     Past Surgical History:  Procedure Laterality Date  . ABDOMINAL SURGERY     diverticulitis perforation  . ORIF ANKLE FRACTURE Right 06/06/2017   Procedure: OPEN REDUCTION INTERNAL FIXATION (ORIF) ANKLE FRACTURE;  Surgeon: Wylene Simmer, MD;  Location: [place];  Service: Orthopedics;  Laterality: Right;  . TOTAL HIP ARTHROPLASTY      There were no vitals filed for this visit.  Subjective Assessment - 09/11/17 0853    Subjective  Reports that he has not done much over several days due to stomach virus and his ankle and shoulder overall are okay from the break. Conitnues to experience a little R shoulder tightness    Pertinent History  s/p right trimalleolar fx s/p surgery on 06/06/17, s/p R rotator cuff repair on 07/15/17.     Patient Stated Goals  get back to work    Currently in Pain?  Yes    Pain Score  2     Pain Location  Shoulder    Pain  Orientation  Right    Pain Descriptors / Indicators  Tightness;Tender    Pain Type  Surgical pain    Pain Onset  More than a month ago         [facility] PT Assessment - 09/11/17 0001      Assessment   Medical Diagnosis  Trimalleolar Fx/dislocation right ankle, R rotator cuffer replair    Hand Dominance  Right    Next MD Visit  TBD    Prior Therapy  yes in past for back      Precautions   Precautions  Shoulder                  [facility] Adult PT Treatment/Exercise - 09/11/17 0001      Shoulder Exercises: Supine   Protraction  AROM;Right;20 reps    Flexion  AROM;Right;20 reps short lever      Shoulder Exercises: Standing   Horizontal ABduction  AROM;Right;20 reps    Retraction  AROM;Right;20 reps    Other Standing Exercises  RUE wall ladder x5 reps with brief hold at top    Other Standing Exercises  RUE wall washing CW x20 reps, CCW x20 reps      Modalities  Modalities  Electrical Stimulation;Cryotherapy;Vasopneumatic      Cryotherapy   Number Minutes Cryotherapy  15 Minutes    Cryotherapy Location  Shoulder    Type of Cryotherapy  Ice pack      Electrical Stimulation   Electrical Stimulation Location  R shoulder    Electrical Stimulation Action  IFC    Electrical Stimulation Parameters  1-10 hz x15 min    Electrical Stimulation Goals  Pain      Vasopneumatic   Number Minutes Vasopneumatic   15 minutes    Vasopnuematic Location   Ankle    Vasopneumatic Pressure  Medium    Vasopneumatic Temperature   34      Ankle Exercises: Sidelying   Ankle Inversion  Strengthening;Right;20 reps;Weights    Ankle Inversion Weights (lbs)  0.5#    Ankle Eversion  Strengthening;Right;20 reps;Weights    Ankle Eversion Weights (lbs)  0.5#      Ankle Exercises: Aerobic   Stationary Bike  Nustep L8 x17min LE only      Ankle Exercises: Standing   Rocker Board  2 minutes focus on gastroc stretch    Heel Raises  15 reps    Toe Raise  15 reps                  PT  Long Term Goals - 09/02/17 0945      PT LONG TERM GOAL #1   Title  Pt will be independent with a HEP and progression.     Time  8    Period  Weeks    Status  Achieved      PT LONG TERM GOAL #2   Title  Pt will improve his R SLS to >/= 30 seconds to improve balance and functional mobility.     Time  8    Period  Weeks    Status  On-going      PT LONG TERM GOAL #3   Title  Walk with normal gait pattern with no pain reported.     Time  8    Period  Weeks    Status  On-going      PT LONG TERM GOAL #4   Title  Pt will improve his R shoulder flexion to >/= 150 degrees.     Time  8    Period  Weeks    Status  On-going P/AAROM 140 degrees 09/02/17      PT LONG TERM GOAL #5   Title  Pt will improve his R grip strength to >/= 40 ppsi.     Time  8    Period  Weeks    Status  Achieved 30# difference 09/02/17            Plan - 09/11/17 0944    Clinical Impression Statement  Patient arrived to clinic with reports of mostly R ankle and shoulder weakness. Patient demonstrated greater difficulty with CW R shoulder wall wash as well as supine protraction and flexion with short lever arm actively. Patient's R ankle DF limited slightly at this time but patient encouraged to complete heel and toe raises at home at countertops to continue strengthening. Normal modalities response noted following removal of the modalities.    Rehab Potential  Good    Clinical Impairments Affecting Rehab Potential  only approved for 12 visits, due to multiple injuries pt may require additional visits    PT Frequency  3x / week    PT Duration  4 weeks  PT Treatment/Interventions  ADLs/Self Care Home Management;Cryotherapy;Electrical Stimulation;Therapeutic exercise;Therapeutic activities;Functional mobility training;Stair training;Gait training;Ultrasound;Balance training;Neuromuscular re-education;Patient/family education;Manual techniques;Passive range of motion;Taping    PT Next Visit Plan   RTC repair  protocol-> AROM strengthening begin week of 2/28, Ankle ROM/strengthening, gait training  (see new order)    PT Home Exercise Plan  Add seated PROM shoulder external rotation.    Consulted and Agree with Plan of Care  Patient       Patient will benefit from skilled therapeutic intervention in order to improve the following deficits and impairments:  Abnormal gait, Pain, Increased edema, Decreased activity tolerance, Decreased strength, Decreased balance, Decreased mobility, Difficulty walking, Impaired UE functional use, Decreased range of motion  Visit Diagnosis: Acute pain of right shoulder  Stiffness of right shoulder, not elsewhere classified  Pain in right ankle and joints of right foot  Difficulty in walking, not elsewhere classified  Gait abnormality     Problem List Patient Active Problem List   Diagnosis Date Noted  . Diverticulitis of colon 05/03/2015    Standley Brooking, PTA 09/11/2017, 10:25 AM  [facility] [address] [place], [place], [address] Phone: [phone]   Fax:  [phone]  Name: Aaron Krause MRN: [id number] Date of Birth: 02-03-1966

## 2017-09-12 ENCOUNTER — Encounter: Payer: Self-pay | Admitting: Physical Therapy

## 2017-09-12 ENCOUNTER — Ambulatory Visit: Payer: Worker's Compensation | Admitting: Physical Therapy

## 2017-09-12 DIAGNOSIS — R5381 Other malaise: Secondary | ICD-10-CM

## 2017-09-12 DIAGNOSIS — M25571 Pain in right ankle and joints of right foot: Secondary | ICD-10-CM

## 2017-09-12 DIAGNOSIS — M25651 Stiffness of right hip, not elsewhere classified: Secondary | ICD-10-CM

## 2017-09-12 DIAGNOSIS — R262 Difficulty in walking, not elsewhere classified: Secondary | ICD-10-CM

## 2017-09-12 DIAGNOSIS — M25511 Pain in right shoulder: Secondary | ICD-10-CM

## 2017-09-12 DIAGNOSIS — R269 Unspecified abnormalities of gait and mobility: Secondary | ICD-10-CM

## 2017-09-12 DIAGNOSIS — M25611 Stiffness of right shoulder, not elsewhere classified: Secondary | ICD-10-CM

## 2017-09-12 DIAGNOSIS — M25551 Pain in right hip: Secondary | ICD-10-CM

## 2017-09-12 NOTE — Therapy (Signed)
[facility] [place], [place], [address] Phone: [phone]   Fax:  [phone]  Physical Therapy Treatment  Patient Details  Name: Aaron Krause MRN: [id number] Date of Birth: 1966-07-17 Referring Provider: Susa Day, MD   Encounter Date: 09/12/2017  PT End of Session - 09/12/17 1311    Visit Number  17    Number of Visits  24    Date for PT Re-Evaluation  10/04/17    PT Start Time  0900    PT Stop Time  0957    PT Time Calculation (min)  57 min       Past Medical History:  Diagnosis Date  . Arthritis   . Diverticulitis    with perforation reported  . Ruptured disk   . Trimalleolar fracture of right ankle     Past Surgical History:  Procedure Laterality Date  . ABDOMINAL SURGERY     diverticulitis perforation  . ORIF ANKLE FRACTURE Right 06/06/2017   Procedure: OPEN REDUCTION INTERNAL FIXATION (ORIF) ANKLE FRACTURE;  Surgeon: Wylene Simmer, MD;  Location: [facility];  Service: Orthopedics;  Laterality: Right;  . TOTAL HIP ARTHROPLASTY      There were no vitals filed for this visit.  Subjective Assessment - 09/12/17 1306    Subjective  I'm pleased but my ankle and shoulder still feel stiff.    Currently in Pain?  Yes    Pain Score  2     Pain Location  Shoulder    Pain Orientation  Right    Pain Descriptors / Indicators  Tightness;Tender    Pain Type  Surgical pain    Pain Onset  More than a month ago    Pain Score  2    Pain Location  Shoulder    Pain Orientation  Right    Pain Descriptors / Indicators  Sore Stiff.    Pain Type  Surgical pain    Pain Onset  More than a month ago                      [facility] Adult PT Treatment/Exercise - 09/12/17 0001      Exercises   Exercises  Knee/Hip;Ankle      Knee/Hip Exercises: Aerobic   Stationary Bike  Level 4 x 15 minutes.      Modalities   Modalities  Consulting civil engineer Location  -- Right shoulder.    Electrical Stimulation Action  IFC    Electrical Stimulation Parameters  80-150 hz x 20 minutes.    Electrical Stimulation Goals  Pain      Vasopneumatic   Number Minutes Vasopneumatic   20 minutes    Vasopnuematic Location   -- Right ankle.    Vasopneumatic Pressure  Medium      Manual Therapy   Manual Therapy  Passive ROM    Passive ROM  In supine:  PROM into right shoulder flexion and ER x 5 minutes.      Ankle Exercises: Standing   Other Standing Ankle Exercises  Rockerboard in parallel bars x 3 minutes forward/back and 3 minutes sided to side.                  PT Long Term Goals - 09/02/17 0945      PT LONG TERM GOAL #1   Title  Pt will be independent with a HEP and progression.  Time  8    Period  Weeks    Status  Achieved      PT LONG TERM GOAL #2   Title  Pt will improve his R SLS to >/= 30 seconds to improve balance and functional mobility.     Time  8    Period  Weeks    Status  On-going      PT LONG TERM GOAL #3   Title  Walk with normal gait pattern with no pain reported.     Time  8    Period  Weeks    Status  On-going      PT LONG TERM GOAL #4   Title  Pt will improve his R shoulder flexion to >/= 150 degrees.     Time  8    Period  Weeks    Status  On-going P/AAROM 140 degrees 09/02/17      PT LONG TERM GOAL #5   Title  Pt will improve his R grip strength to >/= 40 ppsi.     Time  8    Period  Weeks    Status  Achieved 30# difference 09/02/17            Plan - 09/12/17 1312    Clinical Impression Statement  Patient did great today.  He achieved near full right shoulder ER today.       Patient will benefit from skilled therapeutic intervention in order to improve the following deficits and impairments:  Abnormal gait, Pain, Increased edema, Decreased activity tolerance, Decreased strength, Decreased balance, Decreased mobility, Difficulty walking, Impaired UE functional  use, Decreased range of motion  Visit Diagnosis: Acute pain of right shoulder  Stiffness of right shoulder, not elsewhere classified  Pain in right ankle and joints of right foot  Difficulty in walking, not elsewhere classified  Gait abnormality  Hip stiffness, right  Debility  Right hip pain     Problem List Patient Active Problem List   Diagnosis Date Noted  . Diverticulitis of colon 05/03/2015    Javari Bufkin, [place]  MPT 09/12/2017, 1:14 PM  [facility] [address] [place], [place], [address] Phone: [phone]   Fax:  [phone]  Name: Ilan Kahrs MRN: [id number] Date of Birth: 1966-06-11

## 2017-09-13 ENCOUNTER — Ambulatory Visit: Payer: Worker's Compensation | Admitting: Physical Therapy

## 2017-09-13 DIAGNOSIS — R262 Difficulty in walking, not elsewhere classified: Secondary | ICD-10-CM

## 2017-09-13 DIAGNOSIS — M25511 Pain in right shoulder: Secondary | ICD-10-CM

## 2017-09-13 DIAGNOSIS — M25611 Stiffness of right shoulder, not elsewhere classified: Secondary | ICD-10-CM

## 2017-09-13 DIAGNOSIS — M25571 Pain in right ankle and joints of right foot: Secondary | ICD-10-CM

## 2017-09-13 DIAGNOSIS — R269 Unspecified abnormalities of gait and mobility: Secondary | ICD-10-CM

## 2017-09-13 NOTE — Therapy (Signed)
[facility] [place], [place], [address] Phone: [phone]   Fax:  [phone]  Physical Therapy Treatment  Patient Details  Name: Aaron Krause MRN: [id number] Date of Birth: 1966/04/26 Referring Provider: Susa Day, MD   Encounter Date: 09/13/2017  PT End of Session - 09/13/17 0733    Visit Number  18    Number of Visits  24    Date for PT Re-Evaluation  10/04/17    Authorization Type  Pt has 12 visits approved from workers comp. Will need to reapply for further visits.     Authorization Time Period  1 month    Authorization - Visit Number  18    Authorization - Number of Visits  24    PT Start Time  [phone]    PT Stop Time  0817    PT Time Calculation (min)  44 min    Activity Tolerance  Patient tolerated treatment well    Behavior During Therapy  [facility] for tasks assessed/performed       Past Medical History:  Diagnosis Date  . Arthritis   . Diverticulitis    with perforation reported  . Ruptured disk   . Trimalleolar fracture of right ankle     Past Surgical History:  Procedure Laterality Date  . ABDOMINAL SURGERY     diverticulitis perforation  . ORIF ANKLE FRACTURE Right 06/06/2017   Procedure: OPEN REDUCTION INTERNAL FIXATION (ORIF) ANKLE FRACTURE;  Surgeon: Wylene Simmer, MD;  Location: [place];  Service: Orthopedics;  Laterality: Right;  . TOTAL HIP ARTHROPLASTY      There were no vitals filed for this visit.  Subjective Assessment - 09/13/17 0744    Subjective  Patient reported no new complaints; ankle and shoulder still gets sore during AROM.    Pertinent History  s/p right trimalleolar fx s/p surgery on 06/06/17, s/p R rotator cuff repair on 07/15/17.     Patient Stated Goals  get back to work    Currently in Pain?  No/denies         [facility] PT Assessment - 09/13/17 0001      Assessment   Medical Diagnosis  Trimalleolar Fx/dislocation right ankle, R rotator cuffer replair    Hand Dominance  Right    Next MD Visit  TBD    Prior Therapy  yes in past for back      Precautions   Precautions  Shoulder                  [facility] Adult PT Treatment/Exercise - 09/13/17 0001      Exercises   Exercises  Ankle;Shoulder      Shoulder Exercises: Prone   Retraction  AROM;Strengthening;Right;20 reps    Extension  AROM;Strengthening;Right;20 reps      Shoulder Exercises: Sidelying   External Rotation  AROM;Strengthening;Left;20 reps      Shoulder Exercises: Standing   Other Standing Exercises  RUE wall ladder x2 minutes with brief hold at top    Other Standing Exercises  RUE wall washing CW 1 minute, CCW 1 minute      Modalities   Modalities  Electrical Stimulation      Electrical Stimulation   Electrical Stimulation Location  R shoulder    Electrical Stimulation Action  IFC    Electrical Stimulation Parameters  80-150 Hz x10    Electrical Stimulation Goals  Pain      Vasopneumatic   Number Minutes Vasopneumatic   10 minutes  Vasopnuematic Location   Shoulder    Vasopneumatic Pressure  Medium      Ankle Exercises: Aerobic   Stationary Bike  Nustep L6 x50min LE only      Ankle Exercises: Standing   Rocker Board  2 minutes    Rocker Board Limitations  DF/PF and IN/EV 2 minutes with congnitive task and intermittent UE support    Heel Raises  20 reps    Toe Raise  20 reps                  PT Long Term Goals - 09/02/17 0945      PT LONG TERM GOAL #1   Title  Pt will be independent with a HEP and progression.     Time  8    Period  Weeks    Status  Achieved      PT LONG TERM GOAL #2   Title  Pt will improve his R SLS to >/= 30 seconds to improve balance and functional mobility.     Time  8    Period  Weeks    Status  On-going      PT LONG TERM GOAL #3   Title  Walk with normal gait pattern with no pain reported.     Time  8    Period  Weeks    Status  On-going      PT LONG TERM GOAL #4   Title  Pt will improve his R  shoulder flexion to >/= 150 degrees.     Time  8    Period  Weeks    Status  On-going P/AAROM 140 degrees 09/02/17      PT LONG TERM GOAL #5   Title  Pt will improve his R grip strength to >/= 40 ppsi.     Time  8    Period  Weeks    Status  Achieved 30# difference 09/02/17            Plan - 09/13/17 0825    Clinical Impression Statement  Patient was able to complete exercises with some rest breaks secondary to R shoulder muscle fatigue. Patient noted with no pain and the shoulder movement does not hurt, it just gets tired quickly. Patient also noted with numbness and tingling in the R shoulder toward the end of prone exercises. Symptoms dissipated after sitting upright. Patient instructed to perform scap retractions as HEP for proper posture. No adverse affects noted upon removal of modalities.    Clinical Presentation  Stable    Clinical Decision Making  Moderate    Rehab Potential  Good    Clinical Impairments Affecting Rehab Potential  only approved for 12 visits, due to multiple injuries pt may require additional visits    PT Frequency  3x / week    PT Duration  4 weeks    PT Treatment/Interventions  ADLs/Self Care Home Management;Cryotherapy;Electrical Stimulation;Therapeutic exercise;Therapeutic activities;Functional mobility training;Stair training;Gait training;Ultrasound;Balance training;Neuromuscular re-education;Patient/family education;Manual techniques;Passive range of motion;Taping    PT Next Visit Plan   RTC repair protocol-> AROM strengthening begin week of 2/28, Ankle ROM/strengthening, gait training  (see new order)    PT Home Exercise Plan  Scap retractions    Consulted and Agree with Plan of Care  Patient       Patient will benefit from skilled therapeutic intervention in order to improve the following deficits and impairments:  Abnormal gait, Pain, Increased edema, Decreased activity tolerance, Decreased strength, Decreased balance, Decreased mobility,  Difficulty  walking, Impaired UE functional use, Decreased range of motion  Visit Diagnosis: Acute pain of right shoulder  Stiffness of right shoulder, not elsewhere classified  Pain in right ankle and joints of right foot  Difficulty in walking, not elsewhere classified  Gait abnormality     Problem List Patient Active Problem List   Diagnosis Date Noted  . Diverticulitis of colon 05/03/2015   Gabriela Eves, PT, DPT 09/13/2017, 8:37 AM  [facility] [address] [place], [place], [address] Phone: [phone]   Fax:  [phone]  Name: Aaron Krause MRN: [id number] Date of Birth: 01-03-1966

## 2017-09-16 ENCOUNTER — Ambulatory Visit: Payer: Worker's Compensation | Admitting: Physical Therapy

## 2017-09-16 ENCOUNTER — Encounter: Payer: Self-pay | Admitting: Physical Therapy

## 2017-09-16 DIAGNOSIS — M25511 Pain in right shoulder: Secondary | ICD-10-CM | POA: Diagnosis not present

## 2017-09-16 DIAGNOSIS — M25571 Pain in right ankle and joints of right foot: Secondary | ICD-10-CM

## 2017-09-16 DIAGNOSIS — R269 Unspecified abnormalities of gait and mobility: Secondary | ICD-10-CM

## 2017-09-16 DIAGNOSIS — R262 Difficulty in walking, not elsewhere classified: Secondary | ICD-10-CM

## 2017-09-16 DIAGNOSIS — M25611 Stiffness of right shoulder, not elsewhere classified: Secondary | ICD-10-CM

## 2017-09-16 NOTE — Therapy (Signed)
[facility] [place], [place], [address] Phone: [phone]   Fax:  [phone]  Physical Therapy Treatment  Patient Details  Name: Aaron Krause MRN: [id number] Date of Birth: [birth year] Referring Provider: Susa Day, MD   Encounter Date: 09/16/2017  PT End of Session - 09/16/17 1006    Visit Number  19    Number of Visits  24    Date for PT Re-Evaluation  10/04/17    Authorization Type  Pt has 12 visits approved from workers comp. Will need to reapply for further visits.     Authorization Time Period  1 month    Authorization - Visit Number  18    Authorization - Number of Visits  24    PT Start Time  0900    PT Stop Time  0953    PT Time Calculation (min)  53 min    Activity Tolerance  Patient tolerated treatment well    Behavior During Therapy  WFL for tasks assessed/performed       Past Medical History:  Diagnosis Date  . Arthritis   . Diverticulitis    with perforation reported  . Ruptured disk   . Trimalleolar fracture of right ankle     Past Surgical History:  Procedure Laterality Date  . ABDOMINAL SURGERY     diverticulitis perforation  . ORIF ANKLE FRACTURE Right 06/06/2017   Procedure: OPEN REDUCTION INTERNAL FIXATION (ORIF) ANKLE FRACTURE;  Surgeon: Wylene Simmer, MD;  Location: [place];  Service: Orthopedics;  Laterality: Right;  . TOTAL HIP ARTHROPLASTY      There were no vitals filed for this visit.  Subjective Assessment - 09/16/17 0918    Subjective  I'm doing well overall.    Currently in Pain?  Yes    Pain Score  2     Pain Location  Shoulder    Pain Orientation  Right    Pain Descriptors / Indicators  Tightness;Tender    Pain Type  Surgical pain    Pain Onset  More than a month ago    Pain Score  2    Pain Location  Shoulder    Pain Orientation  Right    Pain Descriptors / Indicators  Sore    Pain Onset  More than a month ago                       [facility] Adult PT Treatment/Exercise - 09/16/17 0001      Exercises   Exercises  Shoulder      Shoulder Exercises: Supine   Other Supine Exercises  AAROM full can in supine to fatigue.      Shoulder Exercises: Standing   Other Standing Exercises  Right UE wall ladder with long hold at endrange x 10.      Shoulder Exercises: ROM/Strengthening   UBE (Upper Arm Bike)  8 minutes slowly and gently.      Modalities   Modalities  Health visitor Stimulation Location  Right shoulder.    Electrical Stimulation Action  IFC    Electrical Stimulation Parameters  80-150 Hz x 19 minutes.    Electrical Stimulation Goals  Pain      Vasopneumatic   Number Minutes Vasopneumatic   20 minutes    Vasopnuematic Location   -- Right ankle.    Vasopneumatic Pressure  Medium      Ankle Exercises: Standing  Rocker Board  5 minutes    Other Standing Ankle Exercises  Dynadisc with parallel bar support x 5 minutes.                  PT Long Term Goals - 09/02/17 0945      PT LONG TERM GOAL #1   Title  Pt will be independent with a HEP and progression.     Time  8    Period  Weeks    Status  Achieved      PT LONG TERM GOAL #2   Title  Pt will improve his R SLS to >/= 30 seconds to improve balance and functional mobility.     Time  8    Period  Weeks    Status  On-going      PT LONG TERM GOAL #3   Title  Walk with normal gait pattern with no pain reported.     Time  8    Period  Weeks    Status  On-going      PT LONG TERM GOAL #4   Title  Pt will improve his R shoulder flexion to >/= 150 degrees.     Time  8    Period  Weeks    Status  On-going P/AAROM 140 degrees 09/02/17      PT LONG TERM GOAL #5   Title  Pt will improve his R grip strength to >/= 40 ppsi.     Time  8    Period  Weeks    Status  Achieved 30# difference 09/02/17            Plan - 09/16/17 1008    Clinical  Impression Statement  Excellent job.  Continue with supine right shoulder AAROM.  Patient unable to perfrom antigravity exercise with right shoulder at this time.    Clinical Impairments Affecting Rehab Potential  only approved for 12 visits, due to multiple injuries pt may require additional visits    PT Treatment/Interventions  ADLs/Self Care Home Management;Cryotherapy;Electrical Stimulation;Therapeutic exercise;Therapeutic activities;Functional mobility training;Stair training;Gait training;Ultrasound;Balance training;Neuromuscular re-education;Patient/family education;Manual techniques;Passive range of motion;Taping       Patient will benefit from skilled therapeutic intervention in order to improve the following deficits and impairments:  Abnormal gait, Pain, Increased edema, Decreased activity tolerance, Decreased strength, Decreased balance, Decreased mobility, Difficulty walking, Impaired UE functional use, Decreased range of motion  Visit Diagnosis: Acute pain of right shoulder  Stiffness of right shoulder, not elsewhere classified  Pain in right ankle and joints of right foot  Difficulty in walking, not elsewhere classified  Gait abnormality     Problem List Patient Active Problem List   Diagnosis Date Noted  . Diverticulitis of colon 05/03/2015    Aaron Krause, Aaron Krause 09/16/2017, 10:22 AM  [facility] [address] [place], [place], [address] Phone: [phone]   Fax:  [phone]  Name: Aaron Krause MRN: [id number] Date of Birth: 1966/05/17

## 2017-09-18 ENCOUNTER — Ambulatory Visit: Payer: Worker's Compensation | Admitting: Physical Therapy

## 2017-09-18 DIAGNOSIS — M25571 Pain in right ankle and joints of right foot: Secondary | ICD-10-CM

## 2017-09-18 DIAGNOSIS — M25611 Stiffness of right shoulder, not elsewhere classified: Secondary | ICD-10-CM

## 2017-09-18 DIAGNOSIS — R262 Difficulty in walking, not elsewhere classified: Secondary | ICD-10-CM

## 2017-09-18 DIAGNOSIS — M25511 Pain in right shoulder: Secondary | ICD-10-CM

## 2017-09-18 DIAGNOSIS — R269 Unspecified abnormalities of gait and mobility: Secondary | ICD-10-CM

## 2017-09-18 NOTE — Therapy (Signed)
[facility] [place], [place], [address] Phone: [phone]   Fax:  [phone]  Physical Therapy Treatment  Patient Details  Name: Aaron Krause MRN: [id number] Date of Birth: [birth year] Referring Provider: Susa Day, MD   Encounter Date: 09/18/2017  PT End of Session - 09/18/17 0914    Visit Number  20    Number of Visits  24    Date for PT Re-Evaluation  10/04/17    Authorization Type  Pt has 12 visits approved from workers comp. Will need to reapply for further visits.     Authorization Time Period  1 month    Authorization - Visit Number  52    Authorization - Number of Visits  24    PT Start Time  0913 late arrival    PT Stop Time  0950    PT Time Calculation (min)  37 min    Activity Tolerance  Patient tolerated treatment well    Behavior During Therapy  [facility] for tasks assessed/performed       Past Medical History:  Diagnosis Date  . Arthritis   . Diverticulitis    with perforation reported  . Ruptured disk   . Trimalleolar fracture of right ankle     Past Surgical History:  Procedure Laterality Date  . ABDOMINAL SURGERY     diverticulitis perforation  . ORIF ANKLE FRACTURE Right 06/06/2017   Procedure: OPEN REDUCTION INTERNAL FIXATION (ORIF) ANKLE FRACTURE;  Surgeon: Wylene Simmer, MD;  Location: [place];  Service: Orthopedics;  Laterality: Right;  . TOTAL HIP ARTHROPLASTY      There were no vitals filed for this visit.  Subjective Assessment - 09/18/17 0919    Subjective  Patient reported "i'm doing good no pain at rest". Patient still with increased soreness and fatigue during AROM in both R shoulder and R ankle.    Pertinent History  s/p right trimalleolar fx s/p surgery on 06/06/17, s/p R rotator cuff repair on 07/15/17.     Patient Stated Goals  get back to work    Currently in Pain?  No/denies         [facility] PT Assessment - 09/18/17 0001      Assessment   Medical  Diagnosis  Trimalleolar Fx/dislocation right ankle, R rotator cuffer replair    Hand Dominance  Right    Next MD Visit  TBD    Prior Therapy  yes in past for back      Precautions   Precautions  Shoulder                  [facility] Adult PT Treatment/Exercise - 09/18/17 0001      Exercises   Exercises  Shoulder      Shoulder Exercises: Supine   Other Supine Exercises  AAROM full can in supine x10      Shoulder Exercises: Sidelying   External Rotation  AROM;Strengthening;Left;20 reps      Shoulder Exercises: Standing   Other Standing Exercises  Right UE wall ladder with long hold at endrange x 10.      Shoulder Exercises: ROM/Strengthening   UBE (Upper Arm Bike)  120 RPM x8' total, 4 fwd, 4 bwd.       Modalities   Modalities  Health visitor Stimulation Location  Right shoulder.    Electrical Stimulation Action  IFC    Electrical Stimulation Parameters  80-150 Hz x 10  Electrical Stimulation Goals  Pain      Vasopneumatic   Number Minutes Vasopneumatic   10 minutes    Vasopnuematic Location   Shoulder    Vasopneumatic Pressure  Low      Ankle Exercises: Standing   Rocker Board Limitations  DF/PF and IN/EV 2 minutes each with congnitive task and intermittent UE support      Ankle Exercises: Stretches   Gastroc Stretch  3 reps;30 seconds    Other Stretch  Ant. tib stretch on rockerboard 3x30"                  PT Long Term Goals - 09/02/17 0945      PT LONG TERM GOAL #1   Title  Pt will be independent with a HEP and progression.     Time  8    Period  Weeks    Status  Achieved      PT LONG TERM GOAL #2   Title  Pt will improve his R SLS to >/= 30 seconds to improve balance and functional mobility.     Time  8    Period  Weeks    Status  On-going      PT LONG TERM GOAL #3   Title  Walk with normal gait pattern with no pain reported.     Time  8    Period  Weeks    Status   On-going      PT LONG TERM GOAL #4   Title  Pt will improve his R shoulder flexion to >/= 150 degrees.     Time  8    Period  Weeks    Status  On-going P/AAROM 140 degrees 09/02/17      PT LONG TERM GOAL #5   Title  Pt will improve his R grip strength to >/= 40 ppsi.     Time  8    Period  Weeks    Status  Achieved 30# difference 09/02/17              Patient will benefit from skilled therapeutic intervention in order to improve the following deficits and impairments:     Visit Diagnosis: Acute pain of right shoulder  Stiffness of right shoulder, not elsewhere classified  Pain in right ankle and joints of right foot  Difficulty in walking, not elsewhere classified  Gait abnormality     Problem List Patient Active Problem List   Diagnosis Date Noted  . Diverticulitis of colon 05/03/2015   Gabriela Eves, PT, DPT 09/18/2017, 2:58 PM  [facility] [address] [place], [place], [address] Phone: [phone]   Fax:  [phone]  Name: Aaron Krause MRN: [id number] Date of Birth: July 30, 1966

## 2017-09-20 ENCOUNTER — Ambulatory Visit: Payer: Worker's Compensation | Admitting: Physical Therapy

## 2017-09-20 DIAGNOSIS — M25511 Pain in right shoulder: Secondary | ICD-10-CM | POA: Diagnosis not present

## 2017-09-20 DIAGNOSIS — R269 Unspecified abnormalities of gait and mobility: Secondary | ICD-10-CM

## 2017-09-20 DIAGNOSIS — R262 Difficulty in walking, not elsewhere classified: Secondary | ICD-10-CM

## 2017-09-20 DIAGNOSIS — M25611 Stiffness of right shoulder, not elsewhere classified: Secondary | ICD-10-CM

## 2017-09-20 DIAGNOSIS — M25571 Pain in right ankle and joints of right foot: Secondary | ICD-10-CM

## 2017-09-20 NOTE — Therapy (Signed)
[facility] [place], [place], [address] Phone: [phone]   Fax:  [phone]  Physical Therapy Treatment  Patient Details  Name: Aaron Krause MRN: [id number] Date of Birth: 01/22/1966 Referring Provider: Susa Day, MD   Encounter Date: 09/20/2017  PT End of Session - 09/20/17 0735    Visit Number  21    Number of Visits  24    Date for PT Re-Evaluation  10/04/17    Authorization Type  Pt has 12 visits approved from workers comp. Will need to reapply for further visits.     Authorization Time Period  1 month    Authorization - Visit Number  21    Authorization - Number of Visits  24    PT Start Time  0730    PT Stop Time  0816    PT Time Calculation (min)  46 min    Activity Tolerance  Patient tolerated treatment well    Behavior During Therapy  WFL for tasks assessed/performed       Past Medical History:  Diagnosis Date  . Arthritis   . Diverticulitis    with perforation reported  . Ruptured disk   . Trimalleolar fracture of right ankle     Past Surgical History:  Procedure Laterality Date  . ABDOMINAL SURGERY     diverticulitis perforation  . ORIF ANKLE FRACTURE Right 06/06/2017   Procedure: OPEN REDUCTION INTERNAL FIXATION (ORIF) ANKLE FRACTURE;  Surgeon: Wylene Simmer, MD;  Location: [place];  Service: Orthopedics;  Laterality: Right;  . TOTAL HIP ARTHROPLASTY      There were no vitals filed for this visit.  Subjective Assessment - 09/20/17 0736    Subjective  Patient reported no new complaints.    Pertinent History  s/p right trimalleolar fx s/p surgery on 06/06/17, s/p R rotator cuff repair on 07/15/17.     Patient Stated Goals  get back to work    Currently in Pain?  No/denies         [facility] PT Assessment - 09/20/17 0001      Assessment   Medical Diagnosis  Trimalleolar Fx/dislocation right ankle, R rotator cuffer replair    Hand Dominance  Right    Next MD Visit  TBD     Prior Therapy  yes in past for back      Precautions   Precautions  Shoulder                  [facility] Adult PT Treatment/Exercise - 09/20/17 0001      Exercises   Exercises  Shoulder      Shoulder Exercises: Supine   Other Supine Exercises  AAROM full can in supine x15      Shoulder Exercises: Standing   Other Standing Exercises  Right UE wall ladder with long hold at endrange x 10.      Shoulder Exercises: ROM/Strengthening   UBE (Upper Arm Bike)  120 RPM x10' total, 5 fwd, 5 bwd.     Wall Wash  2 minutes total; 1 min clockwise, 1 min counterclock wise      Electrical Stimulation   Electrical Stimulation Location  Right shoulder    Electrical Stimulation Action  IFC    Electrical Stimulation Parameters  80-150 Hz x10    Electrical Stimulation Goals  Pain      Vasopneumatic   Number Minutes Vasopneumatic   10 minutes    Vasopnuematic Location   Shoulder  Vasopneumatic Pressure  Low      Ankle Exercises: Standing   SLS  4x30" on dynadisc      Ankle Exercises: Stretches   Gastroc Stretch  3 reps;30 seconds                  PT Long Term Goals - 09/02/17 0945      PT LONG TERM GOAL #1   Title  Pt will be independent with a HEP and progression.     Time  8    Period  Weeks    Status  Achieved      PT LONG TERM GOAL #2   Title  Pt will improve his R SLS to >/= 30 seconds to improve balance and functional mobility.     Time  8    Period  Weeks    Status  On-going      PT LONG TERM GOAL #3   Title  Walk with normal gait pattern with no pain reported.     Time  8    Period  Weeks    Status  On-going      PT LONG TERM GOAL #4   Title  Pt will improve his R shoulder flexion to >/= 150 degrees.     Time  8    Period  Weeks    Status  On-going P/AAROM 140 degrees 09/02/17      PT LONG TERM GOAL #5   Title  Pt will improve his R grip strength to >/= 40 ppsi.     Time  8    Period  Weeks    Status  Achieved 30# difference 09/02/17             Plan - 09/20/17 0821    Clinical Impression Statement  Patient continues to have slight difficulty secondary to muscle fatigue during antigravity exercises. Patient to progress to strengthening exercises per protocol next week.    Clinical Presentation  Stable    Clinical Decision Making  Moderate    Rehab Potential  Good    Clinical Impairments Affecting Rehab Potential  only approved for 12 visits, due to multiple injuries pt may require additional visits    PT Frequency  3x / week    PT Duration  4 weeks    PT Treatment/Interventions  ADLs/Self Care Home Management;Cryotherapy;Electrical Stimulation;Therapeutic exercise;Therapeutic activities;Functional mobility training;Stair training;Gait training;Ultrasound;Balance training;Neuromuscular re-education;Patient/family education;Manual techniques;Passive range of motion;Taping    PT Next Visit Plan  Assess goals and ROM.  RTC repair protocol-> AROM strengthening begin week of 2/28, Ankle ROM/strengthening, gait training  (see new order)    Consulted and Agree with Plan of Care  Patient       Patient will benefit from skilled therapeutic intervention in order to improve the following deficits and impairments:  Abnormal gait, Pain, Increased edema, Decreased activity tolerance, Decreased strength, Decreased balance, Decreased mobility, Difficulty walking, Impaired UE functional use, Decreased range of motion  Visit Diagnosis: Acute pain of right shoulder  Stiffness of right shoulder, not elsewhere classified  Pain in right ankle and joints of right foot  Difficulty in walking, not elsewhere classified  Gait abnormality     Problem List Patient Active Problem List   Diagnosis Date Noted  . Diverticulitis of colon 05/03/2015   Gabriela Eves, PT, DPT 09/20/2017, 9:11 AM  [facility] [address] [place], [place], [address] Phone: [phone]   Fax:   [phone]  Name: Aaron Krause MRN:  [id number] Date of Birth: 03-28-1966

## 2017-09-23 ENCOUNTER — Encounter: Payer: Self-pay | Admitting: Physical Therapy

## 2017-09-24 ENCOUNTER — Ambulatory Visit: Payer: Worker's Compensation | Admitting: Physical Therapy

## 2017-09-24 ENCOUNTER — Encounter: Payer: Self-pay | Admitting: Physical Therapy

## 2017-09-24 DIAGNOSIS — M25511 Pain in right shoulder: Secondary | ICD-10-CM

## 2017-09-24 DIAGNOSIS — R262 Difficulty in walking, not elsewhere classified: Secondary | ICD-10-CM

## 2017-09-24 DIAGNOSIS — M25571 Pain in right ankle and joints of right foot: Secondary | ICD-10-CM

## 2017-09-24 DIAGNOSIS — M25611 Stiffness of right shoulder, not elsewhere classified: Secondary | ICD-10-CM

## 2017-09-24 DIAGNOSIS — R269 Unspecified abnormalities of gait and mobility: Secondary | ICD-10-CM

## 2017-09-24 NOTE — Therapy (Signed)
[facility] [place], [place], [address] Phone: [phone]   Fax:  [phone]  Physical Therapy Treatment  Patient Details  Name: Aaron Krause MRN: [id number] Date of Birth: 03/10/1966 Referring Provider: Susa Day, MD   Encounter Date: 09/24/2017  PT End of Session - 09/24/17 1534    Visit Number  22    Number of Visits  24    Date for PT Re-Evaluation  10/04/17    Authorization Type  Pt has 12 visits approved from workers comp. Will need to reapply for further visits.     Authorization Time Period  1 month    Authorization - Visit Number  31    Authorization - Number of Visits  24    PT Start Time  1518    PT Stop Time  1606    PT Time Calculation (min)  48 min    Activity Tolerance  Patient tolerated treatment well    Behavior During Therapy  WFL for tasks assessed/performed       Past Medical History:  Diagnosis Date  . Arthritis   . Diverticulitis    with perforation reported  . Ruptured disk   . Trimalleolar fracture of right ankle     Past Surgical History:  Procedure Laterality Date  . ABDOMINAL SURGERY     diverticulitis perforation  . ORIF ANKLE FRACTURE Right 06/06/2017   Procedure: OPEN REDUCTION INTERNAL FIXATION (ORIF) ANKLE FRACTURE;  Surgeon: Wylene Simmer, MD;  Location: [place];  Service: Orthopedics;  Laterality: Right;  . TOTAL HIP ARTHROPLASTY      There were no vitals filed for this visit.  Subjective Assessment - 09/24/17 1533    Subjective  Reports that he did max resistance on nustep at gym and his LEs and back is still very sore.     Pertinent History  s/p right trimalleolar fx s/p surgery on 06/06/17, s/p R rotator cuff repair on 07/15/17.     Patient Stated Goals  get back to work    Currently in Pain?  Yes    Pain Score  3     Pain Location  Ankle    Pain Orientation  Right    Pain Descriptors / Indicators  Sore    Pain Type  Surgical pain    Pain  Onset  More than a month ago    Pain Frequency  Constant         [facility] PT Assessment - 09/24/17 0001      Assessment   Medical Diagnosis  Trimalleolar Fx/dislocation right ankle, R rotator cuffer replair    Hand Dominance  Right    Next MD Visit  10/07/2017    Prior Therapy  yes in past for back      Precautions   Precautions  Shoulder                  [facility] Adult PT Treatment/Exercise - 09/24/17 0001      Shoulder Exercises: Supine   Protraction  AROM;Right;20 reps    Flexion  AROM;Both;20 reps      Shoulder Exercises: Standing   Other Standing Exercises  RUE wall slides into flexion x20 reps      Shoulder Exercises: ROM/Strengthening   UBE (Upper Arm Bike)  90 RPM x6 min (3 min forward, 3 min backward)    Wall Wash  BUE CW and CCW x20 reps     Wall Pushups  20 reps  Modalities   Modalities  Cryotherapy;Electrical Stimulation;Vasopneumatic      Cryotherapy   Number Minutes Cryotherapy  15 Minutes    Cryotherapy Location  Shoulder    Type of Cryotherapy  Ice pack      Electrical Stimulation   Electrical Stimulation Location  R shoulder    Electrical Stimulation Action  IFC    Electrical Stimulation Parameters  1-10 hz x15 min    Electrical Stimulation Goals  Pain      Vasopneumatic   Number Minutes Vasopneumatic   10 minutes    Vasopnuematic Location   Shoulder    Vasopneumatic Pressure  Medium    Vasopneumatic Temperature   34      Ankle Exercises: Standing   SLS  2x30 sec    Heel Raises  20 reps    Toe Raise  20 reps      Ankle Exercises: Sidelying   Ankle Inversion  Strengthening;Right;20 reps;Weights    Ankle Inversion Weights (lbs)  1#    Ankle Eversion  Strengthening;Right;20 reps;Weights    Ankle Eversion Weights (lbs)  1#                  PT Long Term Goals - 09/02/17 0945      PT LONG TERM GOAL #1   Title  Pt will be independent with a HEP and progression.     Time  8    Period  Weeks    Status  Achieved       PT LONG TERM GOAL #2   Title  Pt will improve his R SLS to >/= 30 seconds to improve balance and functional mobility.     Time  8    Period  Weeks    Status  On-going      PT LONG TERM GOAL #3   Title  Walk with normal gait pattern with no pain reported.     Time  8    Period  Weeks    Status  On-going      PT LONG TERM GOAL #4   Title  Pt will improve his R shoulder flexion to >/= 150 degrees.     Time  8    Period  Weeks    Status  On-going P/AAROM 140 degrees 09/02/17      PT LONG TERM GOAL #5   Title  Pt will improve his R grip strength to >/= 40 ppsi.     Time  8    Period  Weeks    Status  Achieved 30# difference 09/02/17            Plan - 09/24/17 1553    Clinical Impression Statement  Patient tolerated today's treatment fairly well as he arrived with increased low back and LEs. Patient's R shoulder fatigued quickly with antigravity exercises and AROM exercises in supine. Intermittant R shoulder popping and crunching audibly noted with R shoulder AROM flexion and protraction. Patient very sore with all ankle strengthening exercises today. Increased redness reported by patient along medial and lateral R ankle and surrounding medial malleoli. Patient educated to monitor the redness to assess if it improves or worsens. Normal modalities response noted following removal of the modalities. Patient's R shoulder felt tight per patient report and R ankle and foot still felt swollen per patient report. Patient encouraged to ice his R shoulder and ankle prior to bedtime.     Rehab Potential  Good    Clinical Impairments Affecting Rehab Potential  only approved for 12 visits, due to multiple injuries pt may require additional visits    PT Frequency  3x / week    PT Duration  4 weeks    PT Treatment/Interventions  ADLs/Self Care Home Management;Cryotherapy;Electrical Stimulation;Therapeutic exercise;Therapeutic activities;Functional mobility training;Stair training;Gait  training;Ultrasound;Balance training;Neuromuscular re-education;Patient/family education;Manual techniques;Passive range of motion;Taping    PT Next Visit Plan  Assess goals and ROM.  RTC repair protocol-> AROM strengthening begin week of 2/28, Ankle ROM/strengthening, gait training  (see new order)    PT Home Exercise Plan  Scap retractions    Consulted and Agree with Plan of Care  Patient       Patient will benefit from skilled therapeutic intervention in order to improve the following deficits and impairments:  Abnormal gait, Pain, Increased edema, Decreased activity tolerance, Decreased strength, Decreased balance, Decreased mobility, Difficulty walking, Impaired UE functional use, Decreased range of motion  Visit Diagnosis: Acute pain of right shoulder  Stiffness of right shoulder, not elsewhere classified  Pain in right ankle and joints of right foot  Difficulty in walking, not elsewhere classified  Gait abnormality     Problem List Patient Active Problem List   Diagnosis Date Noted  . Diverticulitis of colon 05/03/2015    Standley Brooking, PTA 09/24/2017, 4:14 PM  [facility] [place], [place], [address] Phone: [phone]   Fax:  [phone]  Name: Aaron Krause MRN: [id number] Date of Birth: 09-18-1965

## 2017-09-25 ENCOUNTER — Encounter: Payer: Self-pay | Admitting: Physical Therapy

## 2017-09-25 ENCOUNTER — Ambulatory Visit: Payer: Worker's Compensation | Admitting: Physical Therapy

## 2017-09-25 DIAGNOSIS — R269 Unspecified abnormalities of gait and mobility: Secondary | ICD-10-CM

## 2017-09-25 DIAGNOSIS — M25511 Pain in right shoulder: Secondary | ICD-10-CM | POA: Diagnosis not present

## 2017-09-25 DIAGNOSIS — M25571 Pain in right ankle and joints of right foot: Secondary | ICD-10-CM

## 2017-09-25 DIAGNOSIS — M25611 Stiffness of right shoulder, not elsewhere classified: Secondary | ICD-10-CM

## 2017-09-25 DIAGNOSIS — R262 Difficulty in walking, not elsewhere classified: Secondary | ICD-10-CM

## 2017-09-25 NOTE — Therapy (Signed)
[facility] [place], [place], [address] Phone: [phone]   Fax:  [phone]  Physical Therapy Treatment  Patient Details  Name: Aaron Krause MRN: [id number] Date of Birth: 08-May-1966 Referring Provider: Susa Day, MD   Encounter Date: 09/25/2017  PT End of Session - 09/25/17 0950    Visit Number  23    Number of Visits  24    Date for PT Re-Evaluation  10/04/17    Authorization Type  Pt has 12 visits approved from workers comp. Will need to reapply for further visits.     Authorization Time Period  1 month    Authorization - Visit Number  23    Authorization - Number of Visits  24    PT Start Time  [phone]    PT Stop Time  1000    PT Time Calculation (min)  57 min    Activity Tolerance  Patient tolerated treatment well    Behavior During Therapy  WFL for tasks assessed/performed       Past Medical History:  Diagnosis Date  . Arthritis   . Diverticulitis    with perforation reported  . Ruptured disk   . Trimalleolar fracture of right ankle     Past Surgical History:  Procedure Laterality Date  . ABDOMINAL SURGERY     diverticulitis perforation  . ORIF ANKLE FRACTURE Right 06/06/2017   Procedure: OPEN REDUCTION INTERNAL FIXATION (ORIF) ANKLE FRACTURE;  Surgeon: Wylene Simmer, MD;  Location: [facility];  Service: Orthopedics;  Laterality: Right;  . TOTAL HIP ARTHROPLASTY      There were no vitals filed for this visit.  Subjective Assessment - 09/25/17 0915    Subjective  Patient reported feeling better today.     Pertinent History  s/p right trimalleolar fx s/p surgery on 06/06/17, s/p R rotator cuff repair on 07/15/17.     Patient Stated Goals  get back to work    Currently in Pain?  Yes    Pain Score  2     Pain Location  Ankle    Pain Orientation  Right    Pain Descriptors / Indicators  Sore    Pain Onset  More than a month ago    Pain Frequency  Intermittent    Aggravating Factors    increased activity    Pain Relieving Factors  rest    Pain Score  1    Pain Location  Shoulder    Pain Orientation  Right    Pain Descriptors / Indicators  Sore    Pain Type  Surgical pain    Pain Onset  More than a month ago    Pain Frequency  Constant    Aggravating Factors   certain movements    Pain Relieving Factors  rest                      [facility] Adult PT Treatment/Exercise - 09/25/17 0001      Knee/Hip Exercises: Aerobic   Nustep  x54min L6-7 LE only      Shoulder Exercises: Supine   Flexion  AROM;Both;20 reps;Limitations    Flexion Limitations  in scaption with thumb up      Shoulder Exercises: Standing   Other Standing Exercises  RUE wall slides into flexion x20 reps      Shoulder Exercises: ROM/Strengthening   UBE (Upper Arm Bike)  90 RPM x6 min (3 min forward, 3 min backward)  Wall Wash  BUE CW and CCW x20 reps     Wall Pushups  20 reps      Acupuncturist Location  R shoulder    Electrical Stimulation Action  IFC    Electrical Stimulation Parameters  1-10hz  x66min    Electrical Stimulation Goals  Pain      Vasopneumatic   Number Minutes Vasopneumatic   15 minutes    Vasopnuematic Location   Shoulder    Vasopneumatic Pressure  Medium      Ankle Exercises: Standing   Other Standing Ankle Exercises  SLS on right with left toe taps forward back and side x 10resps      Ankle Exercises: Sidelying   Ankle Inversion  Strengthening;Right;20 reps;Weights    Ankle Inversion Weights (lbs)  1#    Ankle Eversion  Strengthening;Right;20 reps;Weights    Ankle Eversion Weights (lbs)  1#                  PT Long Term Goals - 09/25/17 0940      PT LONG TERM GOAL #1   Title  Pt will be independent with a HEP and progression.     Time  8    Period  Weeks    Status  Achieved      PT LONG TERM GOAL #2   Title  Pt will improve his R SLS to >/= 30 seconds to improve balance and functional mobility.     Time   8    Period  Weeks    Status  On-going      PT LONG TERM GOAL #3   Title  Walk with normal gait pattern with no pain reported.     Time  8    Period  Weeks    Status  On-going      PT LONG TERM GOAL #4   Title  Pt will improve his R shoulder flexion to >/= 150 degrees.     Time  8    Period  Weeks    Status  Achieved 150 degrees AAROM and 160 PROM 09/25/17      PT LONG TERM GOAL #5   Title  Pt will improve his R grip strength to >/= 40 ppsi.     Time  8    Period  Weeks    Status  Achieved            Plan - 09/25/17 7510    Clinical Impression Statement  Patient tolerated treatment well today. Patient able to progress with exercises for right shoulder active range in supine with no discomfort reported and only fatigue, then SLS progression with some LOB, no increased pain. Patient able to meet LTG#4 today with right shoulder ROM, other goals ongoing due to strength deficts.     Rehab Potential  Good    Clinical Impairments Affecting Rehab Potential  only approved for 12 visits, due to multiple injuries pt may require additional visits    PT Frequency  3x / week    PT Duration  4 weeks    PT Treatment/Interventions  ADLs/Self Care Home Management;Cryotherapy;Electrical Stimulation;Therapeutic exercise;Therapeutic activities;Functional mobility training;Stair training;Gait training;Ultrasound;Balance training;Neuromuscular re-education;Patient/family education;Manual techniques;Passive range of motion;Taping    PT Next Visit Plan  Assess goals and ROM.  RTC repair protocol-> AROM strengthening begin week of 2/28, Ankle ROM/strengthening, gait training  (see new order) MD F/U 10/07/17    Consulted and Agree with Plan of Care  Patient  Patient will benefit from skilled therapeutic intervention in order to improve the following deficits and impairments:  Abnormal gait, Pain, Increased edema, Decreased activity tolerance, Decreased strength, Decreased balance, Decreased  mobility, Difficulty walking, Impaired UE functional use, Decreased range of motion  Visit Diagnosis: Acute pain of right shoulder  Stiffness of right shoulder, not elsewhere classified  Pain in right ankle and joints of right foot  Difficulty in walking, not elsewhere classified  Gait abnormality     Problem List Patient Active Problem List   Diagnosis Date Noted  . Diverticulitis of colon 05/03/2015    Phillips Climes, PTA 09/25/2017, 10:06 AM  [facility] [place], [place], [address] Phone: [phone]   Fax:  [phone]  Name: Aaron Krause MRN: [id number] Date of Birth: September 22, 1965

## 2017-09-27 ENCOUNTER — Ambulatory Visit: Payer: PRIVATE HEALTH INSURANCE | Attending: Specialist | Admitting: Physical Therapy

## 2017-09-27 ENCOUNTER — Encounter: Payer: Self-pay | Admitting: Physical Therapy

## 2017-09-27 DIAGNOSIS — M25571 Pain in right ankle and joints of right foot: Secondary | ICD-10-CM | POA: Diagnosis present

## 2017-09-27 DIAGNOSIS — R262 Difficulty in walking, not elsewhere classified: Secondary | ICD-10-CM

## 2017-09-27 DIAGNOSIS — R269 Unspecified abnormalities of gait and mobility: Secondary | ICD-10-CM | POA: Diagnosis present

## 2017-09-27 DIAGNOSIS — M25511 Pain in right shoulder: Secondary | ICD-10-CM | POA: Insufficient documentation

## 2017-09-27 DIAGNOSIS — M25611 Stiffness of right shoulder, not elsewhere classified: Secondary | ICD-10-CM | POA: Insufficient documentation

## 2017-09-27 NOTE — Therapy (Signed)
[facility] [place], [place], [address] Phone: [phone]   Fax:  [phone]  Physical Therapy Treatment  Patient Details  Name: Aaron Krause MRN: [id number] Date of Birth: 23-Jan-1966 Referring Provider: Susa Day, MD   Encounter Date: 09/27/2017  PT End of Session - 09/27/17 0738    Visit Number  24    Number of Visits  27 per case worker authorization    Date for PT Re-Evaluation  10/04/17    Authorization Type  Pt has 12 visits approved from workers comp. Will need to reapply for further visits.     Authorization Time Period  1 month    Authorization - Visit Number  24    Authorization - Number of Visits  24    PT Start Time  [phone]    PT Stop Time  0827    PT Time Calculation (min)  56 min    Activity Tolerance  Patient tolerated treatment well    Behavior During Therapy  WFL for tasks assessed/performed       Past Medical History:  Diagnosis Date  . Arthritis   . Diverticulitis    with perforation reported  . Ruptured disk   . Trimalleolar fracture of right ankle     Past Surgical History:  Procedure Laterality Date  . ABDOMINAL SURGERY     diverticulitis perforation  . ORIF ANKLE FRACTURE Right 06/06/2017   Procedure: OPEN REDUCTION INTERNAL FIXATION (ORIF) ANKLE FRACTURE;  Surgeon: Wylene Simmer, MD;  Location: [facility];  Service: Orthopedics;  Laterality: Right;  . TOTAL HIP ARTHROPLASTY      There were no vitals filed for this visit.  Subjective Assessment - 09/27/17 0733    Subjective  Reports that soreness is pretty much gone now.    Pertinent History  s/p right trimalleolar fx s/p surgery on 06/06/17, s/p R rotator cuff repair on 07/15/17.     Patient Stated Goals  get back to work    Currently in Pain?  No/denies         [facility] PT Assessment - 09/27/17 0001      Assessment   Medical Diagnosis  Trimalleolar Fx/dislocation right ankle, R rotator cuffer replair    Hand  Dominance  Right    Next MD Visit  10/07/2017    Prior Therapy  yes in past for back      Precautions   Precautions  Shoulder                  [facility] Adult PT Treatment/Exercise - 09/27/17 0001      Knee/Hip Exercises: Aerobic   Nustep  L5 x10 min      Shoulder Exercises: Supine   Protraction  AROM;Right;20 reps    Flexion  AROM;Both;20 reps;Limitations      Shoulder Exercises: Sidelying   External Rotation  AROM;20 reps;Right    Flexion  AROM;Right;20 reps      Shoulder Exercises: ROM/Strengthening   UBE (Upper Arm Bike)  60 RPM x6 min (3 min forward, 3 min backward)    Wall Wash  BUE CW and CCW x20 reps     Other ROM/Strengthening Exercises  wall climbs x3 red theraband, lateral wall walks red theraband x1       Modalities   Modalities  Cryotherapy;Electrical Stimulation;Vasopneumatic      Cryotherapy   Number Minutes Cryotherapy  15 Minutes    Cryotherapy Location  Shoulder    Type of Cryotherapy  Ice  pack      Electrical Stimulation   Electrical Stimulation Location  R shoulder      Ankle Exercises: Standing   Heel Raises  20 reps    Toe Raise  20 reps    Other Standing Ankle Exercises  R forward lunge x20 reps, wall squat x10 reps      Ankle Exercises: Sidelying   Ankle Inversion  Strengthening;Right;20 reps;Weights    Ankle Inversion Weights (lbs)  1#    Ankle Eversion  Strengthening;Right;20 reps;Weights    Ankle Eversion Weights (lbs)  1#                  PT Long Term Goals - 09/25/17 0940      PT LONG TERM GOAL #1   Title  Pt will be independent with a HEP and progression.     Time  8    Period  Weeks    Status  Achieved      PT LONG TERM GOAL #2   Title  Pt will improve his R SLS to >/= 30 seconds to improve balance and functional mobility.     Time  8    Period  Weeks    Status  On-going      PT LONG TERM GOAL #3   Title  Walk with normal gait pattern with no pain reported.     Time  8    Period  Weeks    Status   On-going      PT LONG TERM GOAL #4   Title  Pt will improve his R shoulder flexion to >/= 150 degrees.     Time  8    Period  Weeks    Status  Achieved 150 degrees AAROM and 160 PROM 09/25/17      PT LONG TERM GOAL #5   Title  Pt will improve his R grip strength to >/= 40 ppsi.     Time  8    Period  Weeks    Status  Achieved            Plan - 09/27/17 0819    Clinical Impression Statement  Patient tolerated today's treatment well with only chief complaint of fatigue with shoulder exercises. Patient very limited with strength within painful arc range of R shoulder. More lateral instability noted with R forward lunges within parallel bars. Fatigue of R shoulder even in supine and SL. Redness surrounding the M medial malleoli especially along posterior and inferior aspects is now turning brownish color. Normal modalities response noted following removal of the modalities.    Rehab Potential  Good    Clinical Impairments Affecting Rehab Potential  only approved for 12 visits, due to multiple injuries pt may require additional visits    PT Frequency  3x / week    PT Duration  4 weeks    PT Treatment/Interventions  ADLs/Self Care Home Management;Cryotherapy;Electrical Stimulation;Therapeutic exercise;Therapeutic activities;Functional mobility training;Stair training;Gait training;Ultrasound;Balance training;Neuromuscular re-education;Patient/family education;Manual techniques;Passive range of motion;Taping    PT Next Visit Plan  Assess goals and ROM.  RTC repair protocol-> AROM strengthening begin week of 2/28, Ankle ROM/strengthening, gait training  (see new order) MD F/U 10/07/17    PT Home Exercise Plan  Scap retractions    Consulted and Agree with Plan of Care  Patient       Patient will benefit from skilled therapeutic intervention in order to improve the following deficits and impairments:  Abnormal gait, Pain, Increased edema, Decreased activity  tolerance, Decreased strength,  Decreased balance, Decreased mobility, Difficulty walking, Impaired UE functional use, Decreased range of motion  Visit Diagnosis: Acute pain of right shoulder  Stiffness of right shoulder, not elsewhere classified  Pain in right ankle and joints of right foot  Difficulty in walking, not elsewhere classified  Gait abnormality     Problem List Patient Active Problem List   Diagnosis Date Noted  . Diverticulitis of colon 05/03/2015    Standley Brooking, PTA 09/27/2017, 8:53 AM  [facility] [address] [place], [place], [address] Phone: [phone]   Fax:  [phone]  Name: Aaron Krause MRN: [id number] Date of Birth: March 12, 1966

## 2017-09-30 ENCOUNTER — Encounter: Payer: Self-pay | Admitting: Physical Therapy

## 2017-09-30 ENCOUNTER — Ambulatory Visit: Payer: PRIVATE HEALTH INSURANCE | Admitting: Physical Therapy

## 2017-09-30 DIAGNOSIS — R269 Unspecified abnormalities of gait and mobility: Secondary | ICD-10-CM

## 2017-09-30 DIAGNOSIS — M25511 Pain in right shoulder: Secondary | ICD-10-CM | POA: Diagnosis not present

## 2017-09-30 DIAGNOSIS — R262 Difficulty in walking, not elsewhere classified: Secondary | ICD-10-CM

## 2017-09-30 DIAGNOSIS — M25611 Stiffness of right shoulder, not elsewhere classified: Secondary | ICD-10-CM

## 2017-09-30 DIAGNOSIS — M25571 Pain in right ankle and joints of right foot: Secondary | ICD-10-CM

## 2017-09-30 NOTE — Therapy (Signed)
[facility] [place], [place], [address] Phone: [phone]   Fax:  [phone]  Physical Therapy Treatment  Patient Details  Name: Aaron Krause MRN: [id number] Date of Birth: 05/02/1966 Referring Provider: Susa Day, MD   Encounter Date: 09/30/2017  PT End of Session - 09/30/17 0945    Visit Number  25    Number of Visits  27    Date for PT Re-Evaluation  10/04/17    PT Start Time  0901    PT Stop Time  0958    PT Time Calculation (min)  57 min    Activity Tolerance  Patient tolerated treatment well    Behavior During Therapy  [facility] for tasks assessed/performed       Past Medical History:  Diagnosis Date  . Arthritis   . Diverticulitis    with perforation reported  . Ruptured disk   . Trimalleolar fracture of right ankle     Past Surgical History:  Procedure Laterality Date  . ABDOMINAL SURGERY     diverticulitis perforation  . ORIF ANKLE FRACTURE Right 06/06/2017   Procedure: OPEN REDUCTION INTERNAL FIXATION (ORIF) ANKLE FRACTURE;  Surgeon: Wylene Simmer, MD;  Location: [facility];  Service: Orthopedics;  Laterality: Right;  . TOTAL HIP ARTHROPLASTY      There were no vitals filed for this visit.  Subjective Assessment - 09/30/17 0900    Subjective  Patient arrived with no new complaints nly that there is "soreness" in shoulder and ankle    Pertinent History  s/p right trimalleolar fx s/p surgery on 06/06/17, s/p R rotator cuff repair on 07/15/17.     Patient Stated Goals  get back to work    Currently in Pain?  Yes    Pain Score  1     Pain Location  Ankle    Pain Orientation  Right    Pain Descriptors / Indicators  Sore    Pain Type  Surgical pain    Pain Onset  More than a month ago    Pain Frequency  Intermittent    Aggravating Factors   increased activity    Pain Relieving Factors  rest    Pain Score  1    Pain Location  Shoulder    Pain Orientation  Right    Pain Descriptors /  Indicators  Sore    Pain Type  Surgical pain    Pain Onset  More than a month ago    Pain Frequency  Intermittent    Aggravating Factors   increased activity    Pain Relieving Factors  rest                      [facility] Adult PT Treatment/Exercise - 09/30/17 0001      Knee/Hip Exercises: Aerobic   Nustep  L5-7 x10 min LE only      Shoulder Exercises: Supine   Flexion  AROM;Both;20 reps;Limitations;Strengthening    Flexion Limitations  in scaption with thumb up      Shoulder Exercises: Sidelying   Flexion  AROM;Right;20 reps;Strengthening      Shoulder Exercises: Standing   Flexion  Right;5 reps;Limitations    Flexion Limitations  unable to perform due to shoulder compensation      Shoulder Exercises: ROM/Strengthening   UBE (Upper Arm Bike)  60 RPM x6 min (3 min forward, 3 min backward)      Acupuncturist Location  R  shoulder    Electrical Stimulation Action  IFC    Electrical Stimulation Parameters  1-10hz  x77min    Electrical Stimulation Goals  Pain      Vasopneumatic   Number Minutes Vasopneumatic   15 minutes    Vasopnuematic Location   Shoulder    Vasopneumatic Pressure  Medium      Ankle Exercises: Standing   SLS  on airex    Heel Raises  20 reps;Limitations    Toe Raise  20 reps;Limitations    Toe Raise Limitations  on airex      Ankle Exercises: Sidelying   Ankle Inversion  Strengthening;Right;20 reps;Weights    Ankle Inversion Weights (lbs)  1#    Ankle Eversion  Strengthening;Right;20 reps;Weights    Ankle Eversion Weights (lbs)  1#      Ankle Exercises: Seated   Other Seated Ankle Exercises  ankle isolator 2# x20                  PT Long Term Goals - 09/25/17 0940      PT LONG TERM GOAL #1   Title  Pt will be independent with a HEP and progression.     Time  8    Period  Weeks    Status  Achieved      PT LONG TERM GOAL #2   Title  Pt will improve his R SLS to >/= 30 seconds to improve  balance and functional mobility.     Time  8    Period  Weeks    Status  On-going      PT LONG TERM GOAL #3   Title  Walk with normal gait pattern with no pain reported.     Time  8    Period  Weeks    Status  On-going      PT LONG TERM GOAL #4   Title  Pt will improve his R shoulder flexion to >/= 150 degrees.     Time  8    Period  Weeks    Status  Achieved 150 degrees AAROM and 160 PROM 09/25/17      PT LONG TERM GOAL #5   Title  Pt will improve his R grip strength to >/= 40 ppsi.     Time  8    Period  Weeks    Status  Achieved            Plan - 09/30/17 0948    Clinical Impression Statement  Patient tolerated treatment well today and able to complete all exercises and progrression with ankle activities. Patient unable to perfrom antigravity movements due to right shoulder compensation. Patient is to continue supine to sidelying shoulder active ROM/strengthenng at this time. Current goals progressing.     Rehab Potential  Good    PT Frequency  3x / week    PT Duration  4 weeks    PT Treatment/Interventions  ADLs/Self Care Home Management;Cryotherapy;Electrical Stimulation;Therapeutic exercise;Therapeutic activities;Functional mobility training;Stair training;Gait training;Ultrasound;Balance training;Neuromuscular re-education;Patient/family education;Manual techniques;Passive range of motion;Taping    PT Next Visit Plan  Assess goals and ROM.  RTC repair protocol-> AROM strengthening begin week of 2/28, Ankle ROM/strengthening, gait training  (see new order) MD F/U 10/07/17    Consulted and Agree with Plan of Care  Patient       Patient will benefit from skilled therapeutic intervention in order to improve the following deficits and impairments:  Abnormal gait, Pain, Increased edema, Decreased activity tolerance, Decreased strength,  Decreased balance, Decreased mobility, Difficulty walking, Impaired UE functional use, Decreased range of motion  Visit Diagnosis: Acute  pain of right shoulder  Stiffness of right shoulder, not elsewhere classified  Pain in right ankle and joints of right foot  Difficulty in walking, not elsewhere classified  Gait abnormality     Problem List Patient Active Problem List   Diagnosis Date Noted  . Diverticulitis of colon 05/03/2015    Phillips Climes, PTA 09/30/2017, 10:03 AM  [facility] [place], [place], [address] Phone: [phone]   Fax:  [phone]  Name: Mike Hamre MRN: [id number] Date of Birth: 01-Jan-1966

## 2017-10-02 ENCOUNTER — Ambulatory Visit: Payer: PRIVATE HEALTH INSURANCE | Admitting: Physical Therapy

## 2017-10-02 ENCOUNTER — Encounter: Payer: Self-pay | Admitting: Physical Therapy

## 2017-10-02 DIAGNOSIS — R262 Difficulty in walking, not elsewhere classified: Secondary | ICD-10-CM

## 2017-10-02 DIAGNOSIS — M25611 Stiffness of right shoulder, not elsewhere classified: Secondary | ICD-10-CM

## 2017-10-02 DIAGNOSIS — M25511 Pain in right shoulder: Secondary | ICD-10-CM | POA: Diagnosis not present

## 2017-10-02 DIAGNOSIS — M25571 Pain in right ankle and joints of right foot: Secondary | ICD-10-CM

## 2017-10-02 DIAGNOSIS — R269 Unspecified abnormalities of gait and mobility: Secondary | ICD-10-CM

## 2017-10-02 NOTE — Therapy (Signed)
[facility] [place], [place], [address] Phone: [phone]   Fax:  [phone]  Physical Therapy Treatment  Patient Details  Name: Aaron Krause MRN: [id number] Date of Birth: [birth year] Referring Provider: Susa Day, MD   Encounter Date: 10/02/2017  PT End of Session - 10/02/17 0944    Visit Number  26    Number of Visits  27    Date for PT Re-Evaluation  10/04/17    Authorization Type  Pt has 12 visits approved from workers comp. Will need to reapply for further visits.     PT Start Time  0901    PT Stop Time  1000    PT Time Calculation (min)  59 min    Activity Tolerance  Patient tolerated treatment well    Behavior During Therapy  WFL for tasks assessed/performed       Past Medical History:  Diagnosis Date  . Arthritis   . Diverticulitis    with perforation reported  . Ruptured disk   . Trimalleolar fracture of right ankle     Past Surgical History:  Procedure Laterality Date  . ABDOMINAL SURGERY     diverticulitis perforation  . ORIF ANKLE FRACTURE Right 06/06/2017   Procedure: OPEN REDUCTION INTERNAL FIXATION (ORIF) ANKLE FRACTURE;  Surgeon: Wylene Simmer, MD;  Location: [facility];  Service: Orthopedics;  Laterality: Right;  . TOTAL HIP ARTHROPLASTY      There were no vitals filed for this visit.  Subjective Assessment - 10/02/17 0909    Subjective  Patient arrived with no new complaints nly that there is "soreness" in shoulder and ankle    Pertinent History  s/p right trimalleolar fx s/p surgery on 06/06/17, s/p R rotator cuff repair on 07/15/17.     Patient Stated Goals  get back to work    Currently in Pain?  Yes    Pain Score  1     Pain Location  Ankle    Pain Orientation  Right    Pain Descriptors / Indicators  Sore    Pain Type  Surgical pain    Pain Onset  More than a month ago    Pain Frequency  Intermittent    Aggravating Factors   increased activity    Pain Relieving  Factors  rest                      [facility] Adult PT Treatment/Exercise - 10/02/17 0001      Knee/Hip Exercises: Aerobic   Stationary Bike  Level 7 x 10 minutes.      Shoulder Exercises: Supine   Flexion  AROM;Both;20 reps;Limitations;Strengthening;10 reps    Flexion Limitations  in scaption with thumb up      Shoulder Exercises: Sidelying   External Rotation  AROM;20 reps;Right    Flexion  AROM;Right;20 reps;Strengthening      Shoulder Exercises: ROM/Strengthening   UBE (Upper Arm Bike)  60 RPM x6 min (3 min forward, 3 min backward)    Other ROM/Strengthening Exercises  wall climb with light finger lowering x5      Electrical Stimulation   Electrical Stimulation Location  R shoulder    Electrical Stimulation Action  IFC    Electrical Stimulation Parameters  1-10hz  x68min    Electrical Stimulation Goals  Pain      Vasopneumatic   Number Minutes Vasopneumatic   15 minutes    Vasopnuematic Location   Shoulder    Vasopneumatic Pressure  Medium      Ankle Exercises: Sidelying   Ankle Inversion  Strengthening;Right;20 reps;Weights    Ankle Inversion Weights (lbs)  1 1/2 #    Ankle Eversion  Strengthening;Right;20 reps;Weights    Ankle Eversion Weights (lbs)  1 1/2 #      Ankle Exercises: Seated   Other Seated Ankle Exercises  ankle isolator 2# x20      Ankle Exercises: Standing   Rocker Board  4 minutes                  PT Long Term Goals - 09/25/17 0940      PT LONG TERM GOAL #1   Title  Pt will be independent with a HEP and progression.     Time  8    Period  Weeks    Status  Achieved      PT LONG TERM GOAL #2   Title  Pt will improve his R SLS to >/= 30 seconds to improve balance and functional mobility.     Time  8    Period  Weeks    Status  On-going      PT LONG TERM GOAL #3   Title  Walk with normal gait pattern with no pain reported.     Time  8    Period  Weeks    Status  On-going      PT LONG TERM GOAL #4   Title  Pt will  improve his R shoulder flexion to >/= 150 degrees.     Time  8    Period  Weeks    Status  Achieved 150 degrees AAROM and 160 PROM 09/25/17      PT LONG TERM GOAL #5   Title  Pt will improve his R grip strength to >/= 40 ppsi.     Time  8    Period  Weeks    Status  Achieved            Plan - 10/02/17 0947    Clinical Impression Statement  Patient tolerated treatment well today and able to progress right shoulder and ankle strengthening progression. Patient unable to perform standing active ROM due to compensation in shoulder. Patient progressing toward goals and has a F/U appt next monday with both MD's. Goals ongoing.     Rehab Potential  Good    Clinical Impairments Affecting Rehab Potential  surgery 07/15/17 current 11 weeks 09/30/17    PT Frequency  3x / week    PT Duration  4 weeks    PT Next Visit Plan  Assess goals and ROM.  RTC repair protocol Ankle ROM/strengthening, gait training  MD F/U 10/07/17 with both Dr. Tonita Cong and Dr. Milinda Pointer, will need note next treatment    Consulted and Agree with Plan of Care  Patient       Patient will benefit from skilled therapeutic intervention in order to improve the following deficits and impairments:  Abnormal gait, Pain, Increased edema, Decreased activity tolerance, Decreased strength, Decreased balance, Decreased mobility, Difficulty walking, Impaired UE functional use, Decreased range of motion  Visit Diagnosis: Acute pain of right shoulder  Stiffness of right shoulder, not elsewhere classified  Pain in right ankle and joints of right foot  Difficulty in walking, not elsewhere classified  Gait abnormality     Problem List Patient Active Problem List   Diagnosis Date Noted  . Diverticulitis of colon 05/03/2015    DUNFORD, CHRISTINA P, PTA 10/02/2017, 10:05 AM  [facility] [place], [place], [address] Phone: [phone]   Fax:  [phone]  Name: Aaron Krause MRN: [id number] Date of Birth: 02/01/1966

## 2017-10-04 ENCOUNTER — Ambulatory Visit: Payer: PRIVATE HEALTH INSURANCE | Admitting: Physical Therapy

## 2017-10-04 DIAGNOSIS — R262 Difficulty in walking, not elsewhere classified: Secondary | ICD-10-CM

## 2017-10-04 DIAGNOSIS — R269 Unspecified abnormalities of gait and mobility: Secondary | ICD-10-CM

## 2017-10-04 DIAGNOSIS — M25511 Pain in right shoulder: Secondary | ICD-10-CM | POA: Diagnosis not present

## 2017-10-04 DIAGNOSIS — M25611 Stiffness of right shoulder, not elsewhere classified: Secondary | ICD-10-CM

## 2017-10-04 DIAGNOSIS — M25571 Pain in right ankle and joints of right foot: Secondary | ICD-10-CM

## 2017-10-04 NOTE — Therapy (Signed)
[facility] [place], [place], [address] Phone: [phone]   Fax:  [phone]  Physical Therapy Treatment  Patient Details  Name: Aaron Krause MRN: [id number] Date of Birth: [birth year] Referring Provider: Susa Day, MD   Encounter Date: 10/04/2017  PT End of Session - 10/04/17 0744    Visit Number  27    Number of Visits  27    Date for PT Re-Evaluation  10/04/17    Authorization Type  Pt has 12 visits approved from workers comp. Will need to reapply for further visits.     Authorization Time Period  1 month    Authorization - Visit Number  24    Authorization - Number of Visits  24    PT Start Time  [phone]    PT Stop Time  0823    PT Time Calculation (min)  52 min    Activity Tolerance  Patient tolerated treatment well    Behavior During Therapy  WFL for tasks assessed/performed       Past Medical History:  Diagnosis Date  . Arthritis   . Diverticulitis    with perforation reported  . Ruptured disk   . Trimalleolar fracture of right ankle     Past Surgical History:  Procedure Laterality Date  . ABDOMINAL SURGERY     diverticulitis perforation  . ORIF ANKLE FRACTURE Right 06/06/2017   Procedure: OPEN REDUCTION INTERNAL FIXATION (ORIF) ANKLE FRACTURE;  Surgeon: Wylene Simmer, MD;  Location: [facility];  Service: Orthopedics;  Laterality: Right;  . TOTAL HIP ARTHROPLASTY      There were no vitals filed for this visit.  Subjective Assessment - 10/04/17 0745    Subjective  Patient reported shoulder is feeling a little sore, ankle is doing good. Patient will see MD for follow up on monday 3.11.19    Pertinent History  s/p right trimalleolar fx s/p surgery on 06/06/17, s/p R rotator cuff repair on 07/15/17.     Currently in Pain?  Yes    Pain Score  0-No pain    Pain Location  Ankle    Pain Descriptors / Indicators  Sore    Pain Score  1    Pain Location  Shoulder         [facility] PT  Assessment - 10/04/17 0001      Assessment   Medical Diagnosis  Trimalleolar Fx/dislocation right ankle, R rotator cuffer replair    Hand Dominance  Right    Next MD Visit  10/07/2017    Prior Therapy  yes in past for back      Precautions   Precautions  Shoulder      AROM   Overall AROM   Deficits    Overall AROM Comments  noted shoulder elevation compensation    AROM Assessment Site  Shoulder    Right/Left Shoulder  Right    Right Shoulder Flexion  70 Degrees    Right Shoulder ABduction  65 Degrees    Right Shoulder External Rotation  45 Degrees                  [facility] Adult PT Treatment/Exercise - 10/04/17 0001      Knee/Hip Exercises: Aerobic   Nustep  L7 x10 min LE only      Shoulder Exercises: Standing   External Rotation  Strengthening;10 reps;Right    Internal Rotation  Strengthening;Right    Other Standing Exercises  Right UE ranger clockwise and counterclockwise  x20 each      Shoulder Exercises: ROM/Strengthening   UBE (Upper Arm Bike)  60 RPM x6 min (3 min forward, 3 min backward)      Electrical Stimulation   Electrical Stimulation Location  R shoulder    Electrical Stimulation Action  IFC    Electrical Stimulation Parameters  80-150 hz x10    Electrical Stimulation Goals  Pain      Vasopneumatic   Number Minutes Vasopneumatic   10 minutes    Vasopnuematic Location   Shoulder;Ankle    Vasopneumatic Pressure  Medium      Ankle Exercises: Standing   SLS  attempted single leg however unable to perform    Rocker Board  4 minutes AP and lateral 2 min each    Heel Raises  20 reps single leg, 1 UE support    Toe Raise  20 reps                  PT Long Term Goals - 10/04/17 0830      PT LONG TERM GOAL #1   Title  Pt will be independent with a HEP and progression.     Time  8    Period  Weeks    Status  Achieved      PT LONG TERM GOAL #2   Title  Pt will improve his R SLS to >/= 30 seconds to improve balance and functional mobility.      Baseline  currently only able to SLS for about 10-15 seconds    Time  8    Period  Weeks    Status  On-going      PT LONG TERM GOAL #3   Title  Walk with normal gait pattern with no pain reported.     Baseline  10/04/17: walking with no pain however still noted with shortened step length and decreased DF/DF     Time  8    Period  Weeks    Status  On-going      PT LONG TERM GOAL #4   Title  Pt will improve his R shoulder flexion to >/= 150 degrees.     Time  8    Period  Weeks    Status  Achieved      PT LONG TERM GOAL #5   Title  Pt will improve his R grip strength to >/= 40 ppsi.     Time  8    Period  Weeks    Status  Achieved            Plan - 10/04/17 0759    Clinical Impression Statement  Patient able to complete treatment with no increase of pain, just shoulder muscle fatigue. Patient reported difficulty with yellow theraband with ER secondary to muscle fatigue. Patient still limited with AROM with shoulder elevation compensation. Normal response to modalities upon removal. Goals are ongoing.    Clinical Presentation  Stable    Clinical Decision Making  Moderate    Rehab Potential  Good    Clinical Impairments Affecting Rehab Potential  surgery 07/15/17 current 11 weeks 09/30/17    PT Frequency  3x / week    PT Duration  4 weeks    PT Treatment/Interventions  ADLs/Self Care Home Management;Cryotherapy;Electrical Stimulation;Therapeutic exercise;Therapeutic activities;Functional mobility training;Stair training;Gait training;Ultrasound;Balance training;Neuromuscular re-education;Patient/family education;Manual techniques;Passive range of motion;Taping    PT Next Visit Plan   RTC repair protocol Ankle ROM/strengthening, gait training  MD F/U 10/07/17 with both Dr.  Beane and Dr. Milinda Pointer, will need note next treatment    Consulted and Agree with Plan of Care  Patient       Patient will benefit from skilled therapeutic intervention in order to improve the following  deficits and impairments:  Abnormal gait, Pain, Increased edema, Decreased activity tolerance, Decreased strength, Decreased balance, Decreased mobility, Difficulty walking, Impaired UE functional use, Decreased range of motion  Visit Diagnosis: Acute pain of right shoulder  Stiffness of right shoulder, not elsewhere classified  Pain in right ankle and joints of right foot  Difficulty in walking, not elsewhere classified  Gait abnormality     Problem List Patient Active Problem List   Diagnosis Date Noted  . Diverticulitis of colon 05/03/2015   Gabriela Eves, PT, DPT 10/04/2017, 8:40 AM  [facility] [address] [place], [place], [address] Phone: [phone]   Fax:  [phone]  Name: Jamahl Lemmons MRN: [id number] Date of Birth: 04-05-1966

## 2017-10-09 ENCOUNTER — Encounter: Payer: Self-pay | Admitting: Physical Therapy

## 2017-10-09 ENCOUNTER — Ambulatory Visit: Payer: PRIVATE HEALTH INSURANCE | Admitting: Physical Therapy

## 2017-10-09 DIAGNOSIS — M25511 Pain in right shoulder: Secondary | ICD-10-CM

## 2017-10-09 DIAGNOSIS — M25611 Stiffness of right shoulder, not elsewhere classified: Secondary | ICD-10-CM

## 2017-10-09 DIAGNOSIS — M25571 Pain in right ankle and joints of right foot: Secondary | ICD-10-CM

## 2017-10-09 DIAGNOSIS — R262 Difficulty in walking, not elsewhere classified: Secondary | ICD-10-CM

## 2017-10-09 DIAGNOSIS — R269 Unspecified abnormalities of gait and mobility: Secondary | ICD-10-CM

## 2017-10-09 NOTE — Therapy (Signed)
[facility] [place], [place], [address] Phone: [phone]   Fax:  [phone]  Physical Therapy Treatment  Patient Details  Name: Aaron Krause MRN: [id number] Date of Birth: February 23, 1966 Referring Provider: Susa Day, MD   Encounter Date: 10/09/2017  PT End of Session - 10/09/17 1512    Visit Number  28    Number of Visits  40    Date for PT Re-Evaluation  11/18/17    Authorization Type  Pt has 12 visits approved from workers comp. Will need to reapply for further visits.     PT Start Time  1447    PT Stop Time  1530    PT Time Calculation (min)  43 min    Activity Tolerance  Patient tolerated treatment well    Behavior During Therapy  WFL for tasks assessed/performed       Past Medical History:  Diagnosis Date  . Arthritis   . Diverticulitis    with perforation reported  . Ruptured disk   . Trimalleolar fracture of right ankle     Past Surgical History:  Procedure Laterality Date  . ABDOMINAL SURGERY     diverticulitis perforation  . ORIF ANKLE FRACTURE Right 06/06/2017   Procedure: OPEN REDUCTION INTERNAL FIXATION (ORIF) ANKLE FRACTURE;  Surgeon: Wylene Simmer, MD;  Location: [facility];  Service: Orthopedics;  Laterality: Right;  . TOTAL HIP ARTHROPLASTY      There were no vitals filed for this visit.  Subjective Assessment - 10/09/17 1457    Subjective  Patient sore after being back to work    Pertinent History  s/p right trimalleolar fx s/p surgery on 06/06/17, s/p R rotator cuff repair on 07/15/17.     Patient Stated Goals  get back to work    Currently in Pain?  Yes    Pain Score  3     Pain Location  Ankle    Pain Orientation  Right    Pain Descriptors / Indicators  Sore    Pain Type  Surgical pain    Pain Onset  More than a month ago    Pain Frequency  Intermittent    Aggravating Factors   walking on uneven surface    Pain Relieving Factors  rest    Pain Score  3    Pain  Location  Shoulder    Pain Orientation  Right    Pain Type  Surgical pain    Pain Onset  More than a month ago    Pain Frequency  Intermittent    Aggravating Factors   use of shoulder    Pain Relieving Factors  rest                      [facility] Adult PT Treatment/Exercise - 10/09/17 0001      Shoulder Exercises: Standing   Protraction  Strengthening;Right;20 reps;Theraband    Theraband Level (Shoulder Protraction)  Level 2 (Red)    External Rotation  Strengthening;Right;20 reps;Theraband    Theraband Level (Shoulder External Rotation)  Level 2 (Red)    Internal Rotation  Strengthening;Right;20 reps;Theraband    Theraband Level (Shoulder Internal Rotation)  Level 2 (Red)    Row  Strengthening;Right;20 reps;Theraband    Theraband Level (Shoulder Row)  Level 2 (Red)      Shoulder Exercises: ROM/Strengthening   UBE (Upper Arm Bike)  60 RPM x6 min (3 min forward, 3 min backward)      Electrical  Stimulation   Electrical Stimulation Location  R shoulder    Electrical Stimulation Action  IFC    Electrical Stimulation Parameters  80-150hz  x27min    Electrical Stimulation Goals  Pain      Vasopneumatic   Number Minutes Vasopneumatic   10 minutes    Vasopnuematic Location   Shoulder;Ankle    Vasopneumatic Pressure  Medium      Ankle Exercises: Standing   Rocker Board  Limitations;Other (comment);4 minutes Narrow BOS both dirctions      Ankle Exercises: Sidelying   Ankle Inversion  Strengthening;Right;20 reps;Weights    Ankle Inversion Weights (lbs)  1 1/2 #    Ankle Eversion  Strengthening;Right;20 reps;Weights    Ankle Eversion Weights (lbs)  1 1/2 #      Ankle Exercises: Seated   Other Seated Ankle Exercises  ankle isolator 2# x20                  PT Long Term Goals - 10/04/17 0830      PT LONG TERM GOAL #1   Title  Pt will be independent with a HEP and progression.     Time  8    Period  Weeks    Status  Achieved      PT LONG TERM GOAL #2    Title  Pt will improve his R SLS to >/= 30 seconds to improve balance and functional mobility.     Baseline  currently only able to SLS for about 10-15 seconds    Time  8    Period  Weeks    Status  On-going      PT LONG TERM GOAL #3   Title  Walk with normal gait pattern with no pain reported.     Baseline  10/04/17: walking with no pain however still noted with shortened step length and decreased DF/DF     Time  8    Period  Weeks    Status  On-going      PT LONG TERM GOAL #4   Title  Pt will improve his R shoulder flexion to >/= 150 degrees.     Time  8    Period  Weeks    Status  Achieved      PT LONG TERM GOAL #5   Title  Pt will improve his R grip strength to >/= 40 ppsi.     Time  8    Period  Weeks    Status  Achieved            Plan - 10/09/17 1523    Clinical Impression Statement  Patient tolerated treatment well for the first time after being back at work for two full days. Patient has some increased soreness all over yet also in right shoulder and ankle. Patient able to progress slowly with progression per protocol. No limitations with anle and strength progression, no overhead  movement or lifting over 10# per patient. Patient current goals ongoing at this time.     Rehab Potential  Good    PT Frequency  3x / week    PT Duration  4 weeks    PT Treatment/Interventions  ADLs/Self Care Home Management;Cryotherapy;Electrical Stimulation;Therapeutic exercise;Therapeutic activities;Functional mobility training;Stair training;Gait training;Ultrasound;Balance training;Neuromuscular re-education;Patient/family education;Manual techniques;Passive range of motion;Taping    PT Next Visit Plan   RTC repair protocol Ankle ROM/strengthening, gait training     Consulted and Agree with Plan of Care  Patient       Patient  will benefit from skilled therapeutic intervention in order to improve the following deficits and impairments:  Abnormal gait, Pain, Increased edema, Decreased  activity tolerance, Decreased strength, Decreased balance, Decreased mobility, Difficulty walking, Impaired UE functional use, Decreased range of motion  Visit Diagnosis: Acute pain of right shoulder  Stiffness of right shoulder, not elsewhere classified  Pain in right ankle and joints of right foot  Difficulty in walking, not elsewhere classified  Gait abnormality     Problem List Patient Active Problem List   Diagnosis Date Noted  . Diverticulitis of colon 05/03/2015    Phillips Climes, PTA 10/09/2017, 3:30 PM  [facility] [place], [place], [address] Phone: [phone]   Fax:  [phone]  Name: Aaron Krause MRN: [id number] Date of Birth: 1965-12-04

## 2017-10-10 ENCOUNTER — Encounter: Payer: Self-pay | Admitting: Physical Therapy

## 2017-10-11 ENCOUNTER — Ambulatory Visit: Payer: PRIVATE HEALTH INSURANCE | Admitting: Physical Therapy

## 2017-10-11 DIAGNOSIS — M25611 Stiffness of right shoulder, not elsewhere classified: Secondary | ICD-10-CM

## 2017-10-11 DIAGNOSIS — R262 Difficulty in walking, not elsewhere classified: Secondary | ICD-10-CM

## 2017-10-11 DIAGNOSIS — M25511 Pain in right shoulder: Secondary | ICD-10-CM

## 2017-10-11 DIAGNOSIS — M25571 Pain in right ankle and joints of right foot: Secondary | ICD-10-CM

## 2017-10-11 DIAGNOSIS — R269 Unspecified abnormalities of gait and mobility: Secondary | ICD-10-CM

## 2017-10-11 NOTE — Therapy (Signed)
[facility] [place], [place], [address] Phone: [phone]   Fax:  [phone]  Physical Therapy Treatment  Patient Details  Name: Aaron Krause MRN: [id number] Date of Birth: Jan 16, 1966 Referring Provider: Susa Day, MD   Encounter Date: 10/11/2017  PT End of Session - 10/11/17 0736    Visit Number  29    Number of Visits  40    Date for PT Re-Evaluation  11/18/17    Authorization Type  Pt has 12 visits approved from workers comp. Will need to reapply for further visits.     PT Start Time  0730    PT Stop Time  0817    PT Time Calculation (min)  47 min    Activity Tolerance  Patient tolerated treatment well    Behavior During Therapy  [facility] for tasks assessed/performed       Past Medical History:  Diagnosis Date  . Arthritis   . Diverticulitis    with perforation reported  . Ruptured disk   . Trimalleolar fracture of right ankle     Past Surgical History:  Procedure Laterality Date  . ABDOMINAL SURGERY     diverticulitis perforation  . ORIF ANKLE FRACTURE Right 06/06/2017   Procedure: OPEN REDUCTION INTERNAL FIXATION (ORIF) ANKLE FRACTURE;  Surgeon: Wylene Simmer, MD;  Location: [facility];  Service: Orthopedics;  Laterality: Right;  . TOTAL HIP ARTHROPLASTY      There were no vitals filed for this visit.  Subjective Assessment - 10/11/17 0738    Subjective  Patient reports being sore after working thre full days. Yesterday he worked 12 hours he reports he has a lot of muscle fatigue. Patient also reports on a level of soreness, he is a 3/10.    Pertinent History  s/p right trimalleolar fx s/p surgery on 06/06/17, s/p R rotator cuff repair on 07/15/17.     Patient Stated Goals  get back to work         [facility] PT Assessment - 10/11/17 0001      Assessment   Medical Diagnosis  Trimalleolar Fx/dislocation right ankle, R rotator cuffer replair    Hand Dominance  Right    Next MD Visit   11/04/17 Shoulder MD    Prior Therapy  yes in past for back      Precautions   Precautions  Shoulder                  [facility] Adult PT Treatment/Exercise - 10/11/17 0001      Knee/Hip Exercises: Standing   Functional Squat  --    SLS  --    Other Standing Knee Exercises  --      Shoulder Exercises: Standing   Protraction  Strengthening;Right;20 reps;Theraband    Theraband Level (Shoulder Protraction)  Level 2 (Red)    External Rotation  Strengthening;Right;20 reps;Theraband    Theraband Level (Shoulder External Rotation)  Level 2 (Red)    Internal Rotation  Strengthening;Right;20 reps;Theraband    Theraband Level (Shoulder Internal Rotation)  Level 2 (Red)    Extension  Strengthening;Right;20 reps;Limitations    Extension Limitations  Level 2 (Red)    Other Standing Exercises  External rotation isometric step outs x5      Shoulder Exercises: ROM/Strengthening   UBE (Upper Arm Bike)  60 RPM x8 min (4 min forward, 4 min backward)      Vasopneumatic   Number Minutes Vasopneumatic   15 minutes    Vasopnuematic  Location   Shoulder;Ankle    Vasopneumatic Pressure  Medium      Ankle Exercises: Standing   SLS  1 minute hold with intermittent toe touch down and UE support    Balance Beam  lateral stepping x5 intermittent UE support    Other Standing Ankle Exercises  mini squats on airex 3x10                  PT Long Term Goals - 10/04/17 0830      PT LONG TERM GOAL #1   Title  Pt will be independent with a HEP and progression.     Time  8    Period  Weeks    Status  Achieved      PT LONG TERM GOAL #2   Title  Pt will improve his R SLS to >/= 30 seconds to improve balance and functional mobility.     Baseline  currently only able to SLS for about 10-15 seconds    Time  8    Period  Weeks    Status  On-going      PT LONG TERM GOAL #3   Title  Walk with normal gait pattern with no pain reported.     Baseline  10/04/17: walking with no pain however still  noted with shortened step length and decreased DF/DF     Time  8    Period  Weeks    Status  On-going      PT LONG TERM GOAL #4   Title  Pt will improve his R shoulder flexion to >/= 150 degrees.     Time  8    Period  Weeks    Status  Achieved      PT LONG TERM GOAL #5   Title  Pt will improve his R grip strength to >/= 40 ppsi.     Time  8    Period  Weeks    Status  Achieved            Plan - 10/11/17 4403    Clinical Impression Statement  Patient was able to compete exercises despite shoulder and ankle muscle fatigue. Patient stated ankle exercises today simulated work environment. Patient inquired use of an ankle brace during work. Patient instructed he can use brace for external stability and support and to buy a figure 8 lace up brace. Patient reported understanding. Patient deferred e-stim and requested vasopneumatic device only. No adverse effects noted upon removal of modality.    Rehab Potential  Good    Clinical Impairments Affecting Rehab Potential  surgery 07/15/17 current 11 weeks 09/30/17    PT Frequency  3x / week    PT Duration  4 weeks    PT Treatment/Interventions  ADLs/Self Care Home Management;Cryotherapy;Electrical Stimulation;Therapeutic exercise;Therapeutic activities;Functional mobility training;Stair training;Gait training;Ultrasound;Balance training;Neuromuscular re-education;Patient/family education;Manual techniques;Passive range of motion;Taping    PT Next Visit Plan   RTC repair protocol (no overhead or lifting greather than #10) Ankle ROM/strengthening, gait training     Consulted and Agree with Plan of Care  Patient       Patient will benefit from skilled therapeutic intervention in order to improve the following deficits and impairments:  Abnormal gait, Pain, Increased edema, Decreased activity tolerance, Decreased strength, Decreased balance, Decreased mobility, Difficulty walking, Impaired UE functional use, Decreased range of motion  Visit  Diagnosis: Acute pain of right shoulder  Stiffness of right shoulder, not elsewhere classified  Pain in right ankle and joints  of right foot  Difficulty in walking, not elsewhere classified  Gait abnormality     Problem List Patient Active Problem List   Diagnosis Date Noted  . Diverticulitis of colon 05/03/2015   Gabriela Eves, PT, DPT 10/11/2017, 9:41 AM  [facility] [address] [place], [place], [address] Phone: [phone]   Fax:  [phone]  Name: Aaron Krause MRN: [id number] Date of Birth: 1966-01-08

## 2017-10-15 ENCOUNTER — Ambulatory Visit: Payer: PRIVATE HEALTH INSURANCE | Admitting: Physical Therapy

## 2017-10-15 DIAGNOSIS — R269 Unspecified abnormalities of gait and mobility: Secondary | ICD-10-CM

## 2017-10-15 DIAGNOSIS — M25571 Pain in right ankle and joints of right foot: Secondary | ICD-10-CM

## 2017-10-15 DIAGNOSIS — M25511 Pain in right shoulder: Secondary | ICD-10-CM

## 2017-10-15 DIAGNOSIS — R262 Difficulty in walking, not elsewhere classified: Secondary | ICD-10-CM

## 2017-10-15 DIAGNOSIS — M25611 Stiffness of right shoulder, not elsewhere classified: Secondary | ICD-10-CM

## 2017-10-15 NOTE — Therapy (Signed)
[facility] [place], [place], [address] Phone: [phone]   Fax:  [phone]  Physical Therapy Treatment  Patient Details  Name: Aaron Krause MRN: [id number] Date of Birth: May 09, 1966 Referring Provider: Susa Day, MD   Encounter Date: 10/15/2017  PT End of Session - 10/15/17 1727    Visit Number  30    Number of Visits  40    Date for PT Re-Evaluation  11/18/17    Authorization Type  Pt has 12 visits approved from workers comp. Will need to reapply for further visits.     PT Start Time  1645    PT Stop Time  1737    PT Time Calculation (min)  52 min    Activity Tolerance  Patient tolerated treatment well    Behavior During Therapy  WFL for tasks assessed/performed       Past Medical History:  Diagnosis Date  . Arthritis   . Diverticulitis    with perforation reported  . Ruptured disk   . Trimalleolar fracture of right ankle     Past Surgical History:  Procedure Laterality Date  . ABDOMINAL SURGERY     diverticulitis perforation  . ORIF ANKLE FRACTURE Right 06/06/2017   Procedure: OPEN REDUCTION INTERNAL FIXATION (ORIF) ANKLE FRACTURE;  Surgeon: Wylene Simmer, MD;  Location: [facility];  Service: Orthopedics;  Laterality: Right;  . TOTAL HIP ARTHROPLASTY      There were no vitals filed for this visit.  Subjective Assessment - 10/15/17 1648    Subjective  Patient reported feeling sore and tired after working 8.5 hours. On a scale of soreness he is a 3/10.    Pertinent History  s/p right trimalleolar fx s/p surgery on 06/06/17, s/p R rotator cuff repair on 07/15/17.     Patient Stated Goals  get back to work    Currently in Pain?  No/denies    Pain Onset  More than a month ago         [facility] PT Assessment - 10/15/17 0001      Assessment   Medical Diagnosis  Trimalleolar Fx/dislocation right ankle, R rotator cuffer replair    Hand Dominance  Right    Next MD Visit  11/04/17    Prior  Therapy  yes in past for back                  [facility] Adult PT Treatment/Exercise - 10/15/17 0001      Shoulder Exercises: Standing   Protraction  Strengthening;Right;Theraband;20 reps;10 reps    Theraband Level (Shoulder Protraction)  Level 2 (Red)    External Rotation  Strengthening;Right;Theraband;20 reps;10 reps    Theraband Level (Shoulder External Rotation)  Level 2 (Red)    Internal Rotation  Strengthening;Right;20 reps;Theraband;10 reps    Theraband Level (Shoulder Internal Rotation)  Level 3 (Green);Level 2 (Red) x20 with red, followed by x10 with green    Extension  Strengthening;Right;Limitations;10 reps;20 reps    Extension Limitations  Level 2 (Red)    Other Standing Exercises  External rotation isometric step outs x10      Shoulder Exercises: ROM/Strengthening   UBE (Upper Arm Bike)  60 RPM x8 min (4 min forward, 4 min backward)      Vasopneumatic   Number Minutes Vasopneumatic   15 minutes    Vasopnuematic Location   Shoulder;Ankle    Vasopneumatic Pressure  Low      Ankle Exercises: Standing   SLS  1 min  30 second hold with intermittent toe touch down and UE support on balance beam    Balance Beam  lateral stepping, followed by tandem stance x5 each intermittent UE support    Other Standing Ankle Exercises  mini squats on balance beam with both UE support 3x10                  PT Long Term Goals - 10/04/17 0830      PT LONG TERM GOAL #1   Title  Pt will be independent with a HEP and progression.     Time  8    Period  Weeks    Status  Achieved      PT LONG TERM GOAL #2   Title  Pt will improve his R SLS to >/= 30 seconds to improve balance and functional mobility.     Baseline  currently only able to SLS for about 10-15 seconds    Time  8    Period  Weeks    Status  On-going      PT LONG TERM GOAL #3   Title  Walk with normal gait pattern with no pain reported.     Baseline  10/04/17: walking with no pain however still noted with  shortened step length and decreased DF/DF     Time  8    Period  Weeks    Status  On-going      PT LONG TERM GOAL #4   Title  Pt will improve his R shoulder flexion to >/= 150 degrees.     Time  8    Period  Weeks    Status  Achieved      PT LONG TERM GOAL #5   Title  Pt will improve his R grip strength to >/= 40 ppsi.     Time  8    Period  Weeks    Status  Achieved            Plan - 10/15/17 1728    Clinical Impression Statement  Patient was able to complete exercises despite reported soreness at start of session. Patient continues to require intermittent UE support with dynamic balance exercises on non-compliant surfaces however patient feels he is making improvements. Patient attempted shoulder horizontal abduction however patient noted with shoulder elevation. Exercise terminated. Patient educated on importance of performing exercises with proper scapulohumeral rhythm. Patient reported understanding. Patient deferred e-stim; normal response to modalties upon removal.    Clinical Presentation  Stable    Clinical Decision Making  Moderate    Clinical Impairments Affecting Rehab Potential  surgery 07/15/17 current 11 weeks 09/30/17    PT Frequency  3x / week    PT Duration  4 weeks    PT Treatment/Interventions  ADLs/Self Care Home Management;Cryotherapy;Electrical Stimulation;Therapeutic exercise;Therapeutic activities;Functional mobility training;Stair training;Gait training;Ultrasound;Balance training;Neuromuscular re-education;Patient/family education;Manual techniques;Passive range of motion;Taping    PT Next Visit Plan   RTC repair protocol (no overhead or lifting greather than #10) Ankle ROM/strengthening, gait training     Consulted and Agree with Plan of Care  Patient       Patient will benefit from skilled therapeutic intervention in order to improve the following deficits and impairments:  Abnormal gait, Pain, Increased edema, Decreased activity tolerance, Decreased  strength, Decreased balance, Decreased mobility, Difficulty walking, Impaired UE functional use, Decreased range of motion  Visit Diagnosis: Acute pain of right shoulder  Stiffness of right shoulder, not elsewhere classified  Pain in right ankle and joints  of right foot  Difficulty in walking, not elsewhere classified  Gait abnormality     Problem List Patient Active Problem List   Diagnosis Date Noted  . Diverticulitis of colon 05/03/2015   Gabriela Eves, PT, DPT 10/15/2017, 5:44 PM  [facility] [address] [place], [place], [address] Phone: [phone]   Fax:  [phone]  Name: Decklin Weddington MRN: [id number] Date of Birth: 1965-11-12

## 2017-10-17 ENCOUNTER — Ambulatory Visit: Payer: PRIVATE HEALTH INSURANCE | Admitting: Physical Therapy

## 2017-10-17 DIAGNOSIS — M25511 Pain in right shoulder: Secondary | ICD-10-CM

## 2017-10-17 DIAGNOSIS — M25571 Pain in right ankle and joints of right foot: Secondary | ICD-10-CM

## 2017-10-17 DIAGNOSIS — R262 Difficulty in walking, not elsewhere classified: Secondary | ICD-10-CM

## 2017-10-17 DIAGNOSIS — M25611 Stiffness of right shoulder, not elsewhere classified: Secondary | ICD-10-CM

## 2017-10-17 DIAGNOSIS — R269 Unspecified abnormalities of gait and mobility: Secondary | ICD-10-CM

## 2017-10-17 NOTE — Therapy (Signed)
[facility] [place], [place], [address] Phone: [phone]   Fax:  [phone]  Physical Therapy Treatment  Patient Details  Name: Aaron Krause MRN: [id number] Date of Birth: [birth year] Referring Provider: Susa Day, MD   Encounter Date: 10/17/2017  PT End of Session - 10/17/17 1722    Visit Number  31    Number of Visits  40    Date for PT Re-Evaluation  11/18/17    Authorization Type  Pt has 12 visits approved from workers comp. Will need to reapply for further visits.     PT Start Time  1645    PT Stop Time  1735    PT Time Calculation (min)  50 min    Activity Tolerance  Patient tolerated treatment well    Behavior During Therapy  WFL for tasks assessed/performed       Past Medical History:  Diagnosis Date  . Arthritis   . Diverticulitis    with perforation reported  . Ruptured disk   . Trimalleolar fracture of right ankle     Past Surgical History:  Procedure Laterality Date  . ABDOMINAL SURGERY     diverticulitis perforation  . ORIF ANKLE FRACTURE Right 06/06/2017   Procedure: OPEN REDUCTION INTERNAL FIXATION (ORIF) ANKLE FRACTURE;  Surgeon: Wylene Simmer, MD;  Location: [facility];  Service: Orthopedics;  Laterality: Right;  . TOTAL HIP ARTHROPLASTY      There were no vitals filed for this visit.  Subjective Assessment - 10/17/17 1715    Subjective  Patient reported same complaints as last visit. Soreness is 3/10. Patient overall feels his muscular endurance has improved.    Pertinent History  s/p right trimalleolar fx s/p surgery on 06/06/17, s/p R rotator cuff repair on 07/15/17.     Patient Stated Goals  get back to work    Currently in Pain?  No/denies                       [facility] Adult PT Treatment/Exercise - 10/17/17 0001      Shoulder Exercises: Standing   Protraction  Strengthening;Right;Theraband;20 reps;10 reps    Theraband Level (Shoulder Protraction)   Level 3 (Green)    External Rotation  Strengthening;Right;Theraband;20 reps;10 reps    Theraband Level (Shoulder External Rotation)  Level 2 (Red)    Internal Rotation  Strengthening;Right;20 reps;Theraband;10 reps    Theraband Level (Shoulder Internal Rotation)  Level 3 (Green)    Other Standing Exercises  External rotation isometric step outs x10    Other Standing Exercises  standing shoulder rolls with emphasis on shoulder depression x20      Shoulder Exercises: ROM/Strengthening   UBE (Upper Arm Bike)  60 RPM x8 min (4 min forward, 4 min backward)    Other ROM/Strengthening Exercises  scapular clocks, 12, 3, and 6 x15 yellow theraband      Vasopneumatic   Number Minutes Vasopneumatic   15 minutes    Vasopnuematic Location   Shoulder;Ankle    Vasopneumatic Pressure  Low      Ankle Exercises: Standing   SLS  2 minute hold with intermittent toe touch down and UE support on balance beam    Balance Beam  lateral stepping with objects underneath balance airex, followed by no objects under balance airex tandem walking forward and retro x10 each intermittent UE support                  PT Long  Term Goals - 10/04/17 0830      PT LONG TERM GOAL #1   Title  Pt will be independent with a HEP and progression.     Time  8    Period  Weeks    Status  Achieved      PT LONG TERM GOAL #2   Title  Pt will improve his R SLS to >/= 30 seconds to improve balance and functional mobility.     Baseline  currently only able to SLS for about 10-15 seconds    Time  8    Period  Weeks    Status  On-going      PT LONG TERM GOAL #3   Title  Walk with normal gait pattern with no pain reported.     Baseline  10/04/17: walking with no pain however still noted with shortened step length and decreased DF/DF     Time  8    Period  Weeks    Status  On-going      PT LONG TERM GOAL #4   Title  Pt will improve his R shoulder flexion to >/= 150 degrees.     Time  8    Period  Weeks    Status   Achieved      PT LONG TERM GOAL #5   Title  Pt will improve his R grip strength to >/= 40 ppsi.     Time  8    Period  Weeks    Status  Achieved            Plan - 10/17/17 1722    Clinical Impression Statement  Patient was able to complete exercises with proper form and technique. Patient noted with increased muscle fatigue with scapular clock exercises; patient able to complete with rest breaks. Patient noted increased difficulty with retro tandem walking but stated it simulates an activity performed at work. Normal response to modalities upon removal of vasopneumatic device.    Clinical Presentation  Stable    Clinical Decision Making  Moderate    Rehab Potential  Good    Clinical Impairments Affecting Rehab Potential  surgery 07/15/17 current 11 weeks 09/30/17    PT Frequency  3x / week    PT Duration  4 weeks    PT Treatment/Interventions  ADLs/Self Care Home Management;Cryotherapy;Electrical Stimulation;Therapeutic exercise;Therapeutic activities;Functional mobility training;Stair training;Gait training;Ultrasound;Balance training;Neuromuscular re-education;Patient/family education;Manual techniques;Passive range of motion;Taping    PT Next Visit Plan   RTC repair protocol (no overhead or lifting greather than #10) Ankle ROM/strengthening, gait training     PT Home Exercise Plan  scapular retractions, shoulder rolls with emphasis on shoulder depression.    Consulted and Agree with Plan of Care  Patient       Patient will benefit from skilled therapeutic intervention in order to improve the following deficits and impairments:  Abnormal gait, Pain, Increased edema, Decreased activity tolerance, Decreased strength, Decreased balance, Decreased mobility, Difficulty walking, Impaired UE functional use, Decreased range of motion  Visit Diagnosis: Acute pain of right shoulder  Stiffness of right shoulder, not elsewhere classified  Pain in right ankle and joints of right  foot  Difficulty in walking, not elsewhere classified  Gait abnormality     Problem List Patient Active Problem List   Diagnosis Date Noted  . Diverticulitis of colon 05/03/2015   Gabriela Eves, PT, DPT 10/17/2017, 5:31 PM  [facility] [address] [place], [place], [address] Phone: [phone]  Fax:  [phone]  Name: Aaron Krause MRN: [id number] Date of Birth: Jun 06, 1966

## 2017-10-18 ENCOUNTER — Ambulatory Visit: Payer: PRIVATE HEALTH INSURANCE | Admitting: Physical Therapy

## 2017-10-18 DIAGNOSIS — M25571 Pain in right ankle and joints of right foot: Secondary | ICD-10-CM

## 2017-10-18 DIAGNOSIS — M25611 Stiffness of right shoulder, not elsewhere classified: Secondary | ICD-10-CM

## 2017-10-18 DIAGNOSIS — R269 Unspecified abnormalities of gait and mobility: Secondary | ICD-10-CM

## 2017-10-18 DIAGNOSIS — M25511 Pain in right shoulder: Secondary | ICD-10-CM | POA: Diagnosis not present

## 2017-10-18 DIAGNOSIS — R262 Difficulty in walking, not elsewhere classified: Secondary | ICD-10-CM

## 2017-10-18 NOTE — Therapy (Signed)
[facility] [place], [place], [address] Phone: [phone]   Fax:  [phone]  Physical Therapy Treatment  Patient Details  Name: Aaron Krause MRN: [id number] Date of Birth: 1966/04/27 Referring Provider: Susa Day, MD   Encounter Date: 10/18/2017  PT End of Session - 10/18/17 0738    Visit Number  32    Number of Visits  40    Date for PT Re-Evaluation  11/18/17    Authorization Type  Pt has 12 visits approved from workers comp. Will need to reapply for further visits.     PT Start Time  0732    PT Stop Time  0825    PT Time Calculation (min)  53 min    Activity Tolerance  Patient tolerated treatment well    Behavior During Therapy  WFL for tasks assessed/performed       Past Medical History:  Diagnosis Date  . Arthritis   . Diverticulitis    with perforation reported  . Ruptured disk   . Trimalleolar fracture of right ankle     Past Surgical History:  Procedure Laterality Date  . ABDOMINAL SURGERY     diverticulitis perforation  . ORIF ANKLE FRACTURE Right 06/06/2017   Procedure: OPEN REDUCTION INTERNAL FIXATION (ORIF) ANKLE FRACTURE;  Surgeon: Wylene Simmer, MD;  Location: [facility];  Service: Orthopedics;  Laterality: Right;  . TOTAL HIP ARTHROPLASTY      There were no vitals filed for this visit.  Subjective Assessment - 10/18/17 0745    Subjective  Patient reported feeling a little sore this morning.    Pertinent History  s/p right trimalleolar fx s/p surgery on 06/06/17, s/p R rotator cuff repair on 07/15/17.     Patient Stated Goals  get back to work    Currently in Pain?  No/denies         [facility] PT Assessment - 10/18/17 0001      Assessment   Medical Diagnosis  Trimalleolar Fx/dislocation right ankle, R rotator cuffer replair    Hand Dominance  Right    Next MD Visit  11/04/17    Prior Therapy  yes in past for back      Precautions   Precautions  Shoulder                   [facility] Adult PT Treatment/Exercise - 10/18/17 0001      Shoulder Exercises: Standing   Protraction  Strengthening;Right;Theraband;20 reps;10 reps    Theraband Level (Shoulder Protraction)  Level 2 (Red)    External Rotation  Strengthening;Right;Theraband;20 reps;10 reps    Theraband Level (Shoulder External Rotation)  Level 2 (Red)    Internal Rotation  Strengthening;Right;20 reps;Theraband;10 reps    Theraband Level (Shoulder Internal Rotation)  Level 3 (Green)    Extension  Strengthening;Right;Limitations;10 reps;20 reps    Extension Limitations  Level 3 (Green)    Other Standing Exercises  External rotation isometric step outs x10      Shoulder Exercises: ROM/Strengthening   UBE (Upper Arm Bike)  60 RPM x10 min (5 min forward, 5 min backward)      Vasopneumatic   Number Minutes Vasopneumatic   15 minutes    Vasopnuematic Location   Shoulder;Ankle    Vasopneumatic Pressure  Low    Vasopneumatic Temperature   34      Ankle Exercises: Standing   Heel Raises Limitations  x20 on airex    Toe Raise Limitations  x20 on airex  Other Standing Ankle Exercises  WBOS balance on inverted bosu x2 minutes    Other Standing Ankle Exercises  walking on grass forward and retro incline/decline x4                  PT Long Term Goals - 10/04/17 0830      PT LONG TERM GOAL #1   Title  Pt will be independent with a HEP and progression.     Time  8    Period  Weeks    Status  Achieved      PT LONG TERM GOAL #2   Title  Pt will improve his R SLS to >/= 30 seconds to improve balance and functional mobility.     Baseline  currently only able to SLS for about 10-15 seconds    Time  8    Period  Weeks    Status  On-going      PT LONG TERM GOAL #3   Title  Walk with normal gait pattern with no pain reported.     Baseline  10/04/17: walking with no pain however still noted with shortened step length and decreased DF/DF     Time  8    Period  Weeks    Status   On-going      PT LONG TERM GOAL #4   Title  Pt will improve his R shoulder flexion to >/= 150 degrees.     Time  8    Period  Weeks    Status  Achieved      PT LONG TERM GOAL #5   Title  Pt will improve his R grip strength to >/= 40 ppsi.     Time  8    Period  Weeks    Status  Achieved            Plan - 10/18/17 8099    Clinical Impression Statement  Patient continues to have increased muscle fatigue with external rotation. Patient reported an increased challenge with WBOS balance on inverted bosu as demonstrated by UE support on parallel bars. Normal response to modalities upon removal.    Clinical Presentation  Stable    Clinical Decision Making  Moderate    Rehab Potential  Good    Clinical Impairments Affecting Rehab Potential  surgery 07/15/17 current 11 weeks 09/30/17    PT Frequency  3x / week    PT Duration  4 weeks    PT Treatment/Interventions  ADLs/Self Care Home Management;Cryotherapy;Electrical Stimulation;Therapeutic exercise;Therapeutic activities;Functional mobility training;Stair training;Gait training;Ultrasound;Balance training;Neuromuscular re-education;Patient/family education;Manual techniques;Passive range of motion;Taping    PT Next Visit Plan   RTC repair protocol (no overhead or lifting greather than #10) attempt exercises in prone to strengthen posterior muscles Ankle ROM/strengthening, gait training     Consulted and Agree with Plan of Care  Patient       Patient will benefit from skilled therapeutic intervention in order to improve the following deficits and impairments:  Abnormal gait, Pain, Increased edema, Decreased activity tolerance, Decreased strength, Decreased balance, Decreased mobility, Difficulty walking, Impaired UE functional use, Decreased range of motion  Visit Diagnosis: Acute pain of right shoulder  Stiffness of right shoulder, not elsewhere classified  Pain in right ankle and joints of right foot  Difficulty in walking, not  elsewhere classified  Gait abnormality     Problem List Patient Active Problem List   Diagnosis Date Noted  . Diverticulitis of colon 05/03/2015   Gabriela Eves, PT, DPT 10/18/2017,  8:34 AM  [facility] [place], [place], [address] Phone: [phone]   Fax:  [phone]  Name: Aaron Krause MRN: [id number] Date of Birth: 08-30-1965

## 2017-10-22 ENCOUNTER — Ambulatory Visit: Payer: PRIVATE HEALTH INSURANCE | Admitting: Physical Therapy

## 2017-10-22 DIAGNOSIS — M25611 Stiffness of right shoulder, not elsewhere classified: Secondary | ICD-10-CM

## 2017-10-22 DIAGNOSIS — M25571 Pain in right ankle and joints of right foot: Secondary | ICD-10-CM

## 2017-10-22 DIAGNOSIS — R262 Difficulty in walking, not elsewhere classified: Secondary | ICD-10-CM

## 2017-10-22 DIAGNOSIS — M25511 Pain in right shoulder: Secondary | ICD-10-CM | POA: Diagnosis not present

## 2017-10-22 DIAGNOSIS — R269 Unspecified abnormalities of gait and mobility: Secondary | ICD-10-CM

## 2017-10-22 NOTE — Therapy (Signed)
[facility] [place], [place], [address] Phone: [phone]   Fax:  [phone]  Physical Therapy Treatment  Patient Details  Name: Aaron Krause MRN: [id number] Date of Birth: 1966-05-16  Referring Provider: Susa Day, MD   Encounter Date: 10/22/2017  PT End of Session - 10/22/17 1704    Visit Number  33    Number of Visits  40    Date for PT Re-Evaluation  11/18/17    Authorization Type  Pt has 12 visits approved from workers comp. Will need to reapply for further visits.     PT Start Time  1645    PT Stop Time  1739    PT Time Calculation (min)  54 min    Activity Tolerance  Patient tolerated treatment well    Behavior During Therapy  WFL for tasks assessed/performed       Past Medical History:  Diagnosis Date  . Arthritis   . Diverticulitis    with perforation reported  . Ruptured disk   . Trimalleolar fracture of right ankle     Past Surgical History:  Procedure Laterality Date  . ABDOMINAL SURGERY     diverticulitis perforation  . ORIF ANKLE FRACTURE Right 06/06/2017   Procedure: OPEN REDUCTION INTERNAL FIXATION (ORIF) ANKLE FRACTURE;  Surgeon: Wylene Simmer, MD;  Location: [facility];  Service: Orthopedics;  Laterality: Right;  . TOTAL HIP ARTHROPLASTY      There were no vitals filed for this visit.  Subjective Assessment - 10/22/17 1741    Subjective  Patient reported feeling sore today; he arrived after work.    Pertinent History  s/p right trimalleolar fx s/p surgery on 06/06/17, s/p R rotator cuff repair on 07/15/17.     Patient Stated Goals  get back to work    Currently in Pain?  No/denies         [facility] PT Assessment - 10/22/17 0001      Assessment   Medical Diagnosis  Trimalleolar Fx/dislocation right ankle, R rotator cuffer replair    Hand Dominance  Right    Next MD Visit  11/04/17    Prior Therapy  yes in past for back      Precautions   Precautions  Shoulder             No data recorded       [facility] Adult PT Treatment/Exercise - 10/22/17 0001      Shoulder Exercises: Prone   Retraction  AROM;Strengthening;Right;20 reps;Weights    Retraction Weight (lbs)  3    Extension  AROM;Strengthening;Right;20 reps;Weights    Extension Weight (lbs)  3      Shoulder Exercises: Sidelying   External Rotation  AROM;20 reps;Right      Shoulder Exercises: Standing   External Rotation  Strengthening;Right;Theraband;20 reps;10 reps    Theraband Level (Shoulder External Rotation)  Level 2 (Red)    Other Standing Exercises  External rotation isometric  two step outs x10      Shoulder Exercises: ROM/Strengthening   UBE (Upper Arm Bike)  60 RPM x10 min (5 min forward, 5 min backward)      Vasopneumatic   Number Minutes Vasopneumatic   15 minutes    Vasopnuematic Location   Shoulder;Ankle    Vasopneumatic Pressure  Low    Vasopneumatic Temperature   34      Ankle Exercises: Standing   Other Standing Ankle Exercises  forward and retro walking in mini squat on ramp x4,  followed by forwards and lateral walking uphill and downhill x4 each                  PT Long Term Goals - 10/04/17 0830      PT LONG TERM GOAL #1   Title  Pt will be independent with a HEP and progression.     Time  8    Period  Weeks    Status  Achieved      PT LONG TERM GOAL #2   Title  Pt will improve his R SLS to >/= 30 seconds to improve balance and functional mobility.     Baseline  currently only able to SLS for about 10-15 seconds    Time  8    Period  Weeks    Status  On-going      PT LONG TERM GOAL #3   Title  Walk with normal gait pattern with no pain reported.     Baseline  10/04/17: walking with no pain however still noted with shortened step length and decreased DF/DF     Time  8    Period  Weeks    Status  On-going      PT LONG TERM GOAL #4   Title  Pt will improve his R shoulder flexion to >/= 150 degrees.     Time  8    Period  Weeks     Status  Achieved      PT LONG TERM GOAL #5   Title  Pt will improve his R grip strength to >/= 40 ppsi.     Time  8    Period  Weeks    Status  Achieved            Plan - 10/22/17 1742    Clinical Impression Statement  Patient was able to tolerate treatment well with reports of muscle fatigue. Patient reported prone exercise with weights were more challenging than resistance bands. Patient stated walking up/down hill simulated his work environment. He reported a "burning sensation" along the lateral malleolus but patient stated he knows the muscles are workiing. Patient instructed to work on [facility] ER in 45-90 degrees of abduction to improve ROM. Patient in agreement. Normal response to modalities upon removal.    Clinical Presentation  Stable    Clinical Decision Making  Moderate    Rehab Potential  Good    Clinical Impairments Affecting Rehab Potential  surgery 07/15/17 current 11 weeks 09/30/17    PT Frequency  3x / week    PT Duration  4 weeks    PT Treatment/Interventions  ADLs/Self Care Home Management;Cryotherapy;Electrical Stimulation;Therapeutic exercise;Therapeutic activities;Functional mobility training;Stair training;Gait training;Ultrasound;Balance training;Neuromuscular re-education;Patient/family education;Manual techniques;Passive range of motion;Taping    PT Next Visit Plan   RTC repair protocol (no overhead or lifting greather than #10) attempt exercises in prone to strengthen posterior muscles Ankle ROM/strengthening, gait training     Consulted and Agree with Plan of Care  Patient       Patient will benefit from skilled therapeutic intervention in order to improve the following deficits and impairments:  Abnormal gait, Pain, Increased edema, Decreased activity tolerance, Decreased strength, Decreased balance, Decreased mobility, Difficulty walking, Impaired UE functional use, Decreased range of motion  Visit Diagnosis: Acute pain of right shoulder  Stiffness of  right shoulder, not elsewhere classified  Pain in right ankle and joints of right foot  Difficulty in walking, not elsewhere classified  Gait abnormality     Problem  List Patient Active Problem List   Diagnosis Date Noted  . Diverticulitis of colon 05/03/2015   Gabriela Eves, PT, DPT 10/22/2017, 5:47 PM  [facility] [address] [place], [place], [address] Phone: [phone]   Fax:  [phone]  Name: Aaron Krause MRN: [id number] Date of Birth: 01-19-1966

## 2017-10-24 ENCOUNTER — Ambulatory Visit: Payer: PRIVATE HEALTH INSURANCE | Admitting: Physical Therapy

## 2017-10-24 DIAGNOSIS — R262 Difficulty in walking, not elsewhere classified: Secondary | ICD-10-CM

## 2017-10-24 DIAGNOSIS — M25511 Pain in right shoulder: Secondary | ICD-10-CM | POA: Diagnosis not present

## 2017-10-24 DIAGNOSIS — R269 Unspecified abnormalities of gait and mobility: Secondary | ICD-10-CM

## 2017-10-24 DIAGNOSIS — M25611 Stiffness of right shoulder, not elsewhere classified: Secondary | ICD-10-CM

## 2017-10-24 DIAGNOSIS — M25571 Pain in right ankle and joints of right foot: Secondary | ICD-10-CM

## 2017-10-24 NOTE — Therapy (Signed)
[facility] [place], [place], [address] Phone: [phone]   Fax:  [phone]  Physical Therapy Treatment  Patient Details  Name: Aaron Krause MRN: [id number] Date of Birth: 11-May-1966 Referring Provider: Susa Day, MD   Encounter Date: 10/24/2017  PT End of Session - 10/24/17 1703    Visit Number  34    Number of Visits  40    Date for PT Re-Evaluation  11/18/17    Authorization Type  Pt has 12 visits approved from workers comp. Will need to reapply for further visits.     PT Start Time  1645    PT Stop Time  1738    PT Time Calculation (min)  53 min    Activity Tolerance  Patient tolerated treatment well    Behavior During Therapy  WFL for tasks assessed/performed       Past Medical History:  Diagnosis Date  . Arthritis   . Diverticulitis    with perforation reported  . Ruptured disk   . Trimalleolar fracture of right ankle     Past Surgical History:  Procedure Laterality Date  . ABDOMINAL SURGERY     diverticulitis perforation  . ORIF ANKLE FRACTURE Right 06/06/2017   Procedure: OPEN REDUCTION INTERNAL FIXATION (ORIF) ANKLE FRACTURE;  Surgeon: Wylene Simmer, MD;  Location: [facility];  Service: Orthopedics;  Laterality: Right;  . TOTAL HIP ARTHROPLASTY      There were no vitals filed for this visit.  Subjective Assessment - 10/24/17 1740    Subjective  Patient reported feeling sore but stated he notices improvement, just not as fast as he'd like.    Pertinent History  s/p right trimalleolar fx s/p surgery on 06/06/17, s/p R rotator cuff repair on 07/15/17.     Patient Stated Goals  get back to work    Currently in Pain?  Yes    Pain Onset  More than a month ago         [facility] PT Assessment - 10/24/17 0001      Assessment   Medical Diagnosis  Trimalleolar Fx/dislocation right ankle, R rotator cuffer replair    Hand Dominance  Right    Next MD Visit  11/04/17    Prior Therapy  yes  in past for back      Precautions   Precautions  Shoulder            No data recorded       [facility] Adult PT Treatment/Exercise - 10/24/17 0001      Shoulder Exercises: Prone   Retraction  AROM;Strengthening;Right;Weights 2x15    Retraction Weight (lbs)  3    Extension  AROM;Strengthening;Right;Weights 2x15    Extension Weight (lbs)  3    Horizontal ABduction 1  AROM;Right;20 reps      Shoulder Exercises: Standing   External Rotation  Strengthening;Right;Theraband;20 reps;10 reps    Theraband Level (Shoulder External Rotation)  Level 2 (Red)    Other Standing Exercises  External rotation isometric  two step outs x15      Shoulder Exercises: ROM/Strengthening   UBE (Upper Arm Bike)  60 RPM x10 min (5 min forward, 5 min backward)      Vasopneumatic   Number Minutes Vasopneumatic   15 minutes    Vasopnuematic Location   Shoulder;Ankle    Vasopneumatic Pressure  Low      Ankle Exercises: Standing   SLS  2 minute hold with intermittent toe touch down and UE  support on balance beam    Braiding (Round Trip)  4x on airex in parallel bars with intermittent UE support    Other Standing Ankle Exercises  stepping on colored spike balls x4                  PT Long Term Goals - 10/04/17 0830      PT LONG TERM GOAL #1   Title  Pt will be independent with a HEP and progression.     Time  8    Period  Weeks    Status  Achieved      PT LONG TERM GOAL #2   Title  Pt will improve his R SLS to >/= 30 seconds to improve balance and functional mobility.     Baseline  currently only able to SLS for about 10-15 seconds    Time  8    Period  Weeks    Status  On-going      PT LONG TERM GOAL #3   Title  Walk with normal gait pattern with no pain reported.     Baseline  10/04/17: walking with no pain however still noted with shortened step length and decreased DF/DF     Time  8    Period  Weeks    Status  On-going      PT LONG TERM GOAL #4   Title  Pt will improve his  R shoulder flexion to >/= 150 degrees.     Time  8    Period  Weeks    Status  Achieved      PT LONG TERM GOAL #5   Title  Pt will improve his R grip strength to >/= 40 ppsi.     Time  8    Period  Weeks    Status  Achieved            Plan - 10/24/17 1741    Clinical Impression Statement  Patient was able to tolerate treatment well. Patient noted with improvements in muscle fatigue during external rotation step outs. He is able to perform 9 repetitions before requiring rest. Patient noted with increased difficulty with braiding stepping pattern on airex secondary to the narrow beam. Patient still limited with SLS time, 10-15 seconds before requiring UE support or toe touch down. Patient stated "it's tired." Normal response to modalities upon removal.     Clinical Presentation  Stable    Clinical Decision Making  Moderate    Rehab Potential  Good    Clinical Impairments Affecting Rehab Potential  surgery 07/15/17 current 11 weeks 09/30/17    PT Frequency  3x / week    PT Duration  4 weeks    PT Treatment/Interventions  ADLs/Self Care Home Management;Cryotherapy;Electrical Stimulation;Therapeutic exercise;Therapeutic activities;Functional mobility training;Stair training;Gait training;Ultrasound;Balance training;Neuromuscular re-education;Patient/family education;Manual techniques;Passive range of motion;Taping    PT Next Visit Plan   RTC repair protocol (no overhead or lifting greather than #10) Assess ROM; Joint mobility, rhythmic stabilization of the shoulder, Ankle ROM/strengthening, gait training     Consulted and Agree with Plan of Care  Patient       Patient will benefit from skilled therapeutic intervention in order to improve the following deficits and impairments:  Abnormal gait, Pain, Increased edema, Decreased activity tolerance, Decreased strength, Decreased balance, Decreased mobility, Difficulty walking, Impaired UE functional use, Decreased range of motion  Visit  Diagnosis: Acute pain of right shoulder  Stiffness of right shoulder, not elsewhere classified  Pain in  right ankle and joints of right foot  Difficulty in walking, not elsewhere classified  Gait abnormality     Problem List Patient Active Problem List   Diagnosis Date Noted  . Diverticulitis of colon 05/03/2015   Gabriela Eves, PT, DPT 10/24/2017, 5:45 PM  [facility] [address] [place], [place], [address] Phone: [phone]   Fax:  [phone]  Name: Aaron Krause MRN: [id number] Date of Birth: [birth year]

## 2017-10-25 ENCOUNTER — Ambulatory Visit: Payer: PRIVATE HEALTH INSURANCE | Admitting: Physical Therapy

## 2017-10-25 DIAGNOSIS — M25511 Pain in right shoulder: Secondary | ICD-10-CM

## 2017-10-25 DIAGNOSIS — R269 Unspecified abnormalities of gait and mobility: Secondary | ICD-10-CM

## 2017-10-25 DIAGNOSIS — R262 Difficulty in walking, not elsewhere classified: Secondary | ICD-10-CM

## 2017-10-25 DIAGNOSIS — M25611 Stiffness of right shoulder, not elsewhere classified: Secondary | ICD-10-CM

## 2017-10-25 DIAGNOSIS — M25571 Pain in right ankle and joints of right foot: Secondary | ICD-10-CM

## 2017-10-25 NOTE — Therapy (Signed)
[facility] [place], [place], [address] Phone: [phone]   Fax:  [phone]  Physical Therapy Treatment  Patient Details  Name: Aaron Krause MRN: [id number] Date of Birth: [birth year] Referring Provider: Susa Day, MD   Encounter Date: 10/25/2017  PT End of Session - 10/25/17 0746    Visit Number  35    Number of Visits  40    Date for PT Re-Evaluation  11/18/17    Authorization Type  Pt has 12 visits approved from workers comp. Will need to reapply for further visits.     PT Start Time  [phone]    PT Stop Time  0826    PT Time Calculation (min)  55 min    Activity Tolerance  Patient tolerated treatment well    Behavior During Therapy  [facility] for tasks assessed/performed       Past Medical History:  Diagnosis Date  . Arthritis   . Diverticulitis    with perforation reported  . Ruptured disk   . Trimalleolar fracture of right ankle     Past Surgical History:  Procedure Laterality Date  . ABDOMINAL SURGERY     diverticulitis perforation  . ORIF ANKLE FRACTURE Right 06/06/2017   Procedure: OPEN REDUCTION INTERNAL FIXATION (ORIF) ANKLE FRACTURE;  Surgeon: Wylene Simmer, MD;  Location: [place];  Service: Orthopedics;  Laterality: Right;  . TOTAL HIP ARTHROPLASTY      There were no vitals filed for this visit.  Subjective Assessment - 10/25/17 0746    Subjective  Patient reports feeeling a little sore this am. On a scale of soreness, right shoulder and right ankle is 2/10.    Pertinent History  s/p right trimalleolar fx s/p surgery on 06/06/17, s/p R rotator cuff repair on 07/15/17.     Patient Stated Goals  get back to work    Currently in Pain?  No/denies         [facility] PT Assessment - 10/25/17 0001      Assessment   Medical Diagnosis  Trimalleolar Fx/dislocation right ankle, R rotator cuffer replair    Hand Dominance  Right    Next MD Visit  11/04/17    Prior Therapy  yes in past for back       Precautions   Precautions  Shoulder      AROM   Right Shoulder External Rotation  50 Degrees      PROM   Right Shoulder Flexion  125 Degrees (+) reports increased pulling and tightness    Right Shoulder ABduction  130 Degrees    Right Shoulder External Rotation  60 Degrees            No data recorded       [facility] Adult PT Treatment/Exercise - 10/25/17 0001      Shoulder Exercises: Prone   Retraction  AROM;Strengthening;Right;Weights    Retraction Weight (lbs)  3    Extension  AROM;Strengthening;Right;Weights    Extension Weight (lbs)  3    Other Prone Exercises  mid rows 2x15 3#      Shoulder Exercises: Standing   External Rotation  Strengthening;Right;Theraband;20 reps;10 reps    Theraband Level (Shoulder External Rotation)  Level 2 (Red)    Other Standing Exercises  External rotation isometric  two step outs x15      Shoulder Exercises: ROM/Strengthening   UBE (Upper Arm Bike)  60 RPM x10 min (5 min forward, 5 min backward)  Vasopneumatic   Number Minutes Vasopneumatic   15 minutes    Vasopnuematic Location   Shoulder    Vasopneumatic Pressure  Low      Manual Therapy   Manual Therapy  Scapular mobilization;Passive ROM    Scapular Mobilization  elevation, depression, upward and downward rotation to improve scapulohumeral rhythm    Passive ROM  PROM into right shoulder flexion and ER Grade III-IV Posterior joint mobs, inferior glides, and long axis distraction to improve ROM                   PT Long Term Goals - 10/04/17 0830      PT LONG TERM GOAL #1   Title  Pt will be independent with a HEP and progression.     Time  8    Period  Weeks    Status  Achieved      PT LONG TERM GOAL #2   Title  Pt will improve his R SLS to >/= 30 seconds to improve balance and functional mobility.     Baseline  currently only able to SLS for about 10-15 seconds    Time  8    Period  Weeks    Status  On-going      PT LONG TERM GOAL #3   Title   Walk with normal gait pattern with no pain reported.     Baseline  10/04/17: walking with no pain however still noted with shortened step length and decreased DF/DF     Time  8    Period  Weeks    Status  On-going      PT LONG TERM GOAL #4   Title  Pt will improve his R shoulder flexion to >/= 150 degrees.     Time  8    Period  Weeks    Status  Achieved      PT LONG TERM GOAL #5   Title  Pt will improve his R grip strength to >/= 40 ppsi.     Time  8    Period  Weeks    Status  Achieved            Plan - 10/25/17 3474    Clinical Impression Statement  Therapy session focused on shoulder strengthening and ROM today. Patient able to tolerate treatment well. Patient noted with increased tissue resistance with Grade III posterior joint mobilization. Patient's scapular mobility within normal limits. Patient has made improvements with active and passive ER ROM. PROM Normal response to modalities upon removal.     Clinical Presentation  Stable    Clinical Decision Making  Moderate    Rehab Potential  Good    Clinical Impairments Affecting Rehab Potential  surgery 07/15/17 current 11 weeks 09/30/17    PT Frequency  3x / week       Patient will benefit from skilled therapeutic intervention in order to improve the following deficits and impairments:  Abnormal gait, Pain, Increased edema, Decreased activity tolerance, Decreased strength, Decreased balance, Decreased mobility, Difficulty walking, Impaired UE functional use, Decreased range of motion  Visit Diagnosis: Acute pain of right shoulder  Stiffness of right shoulder, not elsewhere classified  Pain in right ankle and joints of right foot  Difficulty in walking, not elsewhere classified  Gait abnormality     Problem List Patient Active Problem List   Diagnosis Date Noted  . Diverticulitis of colon 05/03/2015   Gabriela Eves, PT, DPT 10/25/2017, 8:43 AM  [facility] [place], [place], [address] Phone: [phone]   Fax:  [phone]  Name: Aaron Krause MRN: [id number] Date of Birth: 10-24-1965

## 2017-10-29 ENCOUNTER — Ambulatory Visit: Payer: PRIVATE HEALTH INSURANCE | Attending: Specialist | Admitting: Physical Therapy

## 2017-10-29 DIAGNOSIS — M25651 Stiffness of right hip, not elsewhere classified: Secondary | ICD-10-CM | POA: Insufficient documentation

## 2017-10-29 DIAGNOSIS — R269 Unspecified abnormalities of gait and mobility: Secondary | ICD-10-CM | POA: Insufficient documentation

## 2017-10-29 DIAGNOSIS — M25511 Pain in right shoulder: Secondary | ICD-10-CM | POA: Insufficient documentation

## 2017-10-29 DIAGNOSIS — M25571 Pain in right ankle and joints of right foot: Secondary | ICD-10-CM | POA: Insufficient documentation

## 2017-10-29 DIAGNOSIS — R262 Difficulty in walking, not elsewhere classified: Secondary | ICD-10-CM | POA: Diagnosis present

## 2017-10-29 DIAGNOSIS — M25611 Stiffness of right shoulder, not elsewhere classified: Secondary | ICD-10-CM | POA: Insufficient documentation

## 2017-10-29 NOTE — Therapy (Signed)
[facility] [place], [place], [address] Phone: [phone]   Fax:  [phone]  Physical Therapy Treatment  Patient Details  Name: Aaron Krause MRN: [id number] Date of Birth: 08/29/1965 Referring Provider: Susa Day, MD   Encounter Date: 10/29/2017  PT End of Session - 10/29/17 1655    Visit Number  36    Number of Visits  40    Date for PT Re-Evaluation  11/18/17    Authorization Type  Pt has 12 visits approved from workers comp. Will need to reapply for further visits.     PT Start Time  1646    PT Stop Time  1740    PT Time Calculation (min)  54 min    Activity Tolerance  Patient tolerated treatment well    Behavior During Therapy  WFL for tasks assessed/performed       Past Medical History:  Diagnosis Date  . Arthritis   . Diverticulitis    with perforation reported  . Ruptured disk   . Trimalleolar fracture of right ankle     Past Surgical History:  Procedure Laterality Date  . ABDOMINAL SURGERY     diverticulitis perforation  . ORIF ANKLE FRACTURE Right 06/06/2017   Procedure: OPEN REDUCTION INTERNAL FIXATION (ORIF) ANKLE FRACTURE;  Surgeon: Wylene Simmer, MD;  Location: [place];  Service: Orthopedics;  Laterality: Right;  . TOTAL HIP ARTHROPLASTY      There were no vitals filed for this visit.  Subjective Assessment - 10/29/17 1656    Subjective  Reports he ascended/decended stairs multiple times today. On a scale of soreness, he is about a 3-4/10 in the R ankle and a 2/10 for the shoulder.    Pertinent History  s/p right trimalleolar fx s/p surgery on 06/06/17, s/p R rotator cuff repair on 07/15/17.     Patient Stated Goals  get back to work         [facility] PT Assessment - 10/29/17 0001      Assessment   Medical Diagnosis  Trimalleolar Fx/dislocation right ankle, R rotator cuffer replair    Hand Dominance  Right    Next MD Visit  11/04/17    Prior Therapy  yes in past for back       Precautions   Precautions  Shoulder                   [facility] Adult PT Treatment/Exercise - 10/29/17 0001      Exercises   Exercises  Shoulder;Ankle      Shoulder Exercises: Prone   Other Prone Exercises  --      Shoulder Exercises: Standing   External Rotation  Strengthening;Right;Theraband;20 reps;10 reps    Theraband Level (Shoulder External Rotation)  Level 2 (Red)    Other Standing Exercises  External rotation isometric  two step outs x15    Other Standing Exercises  Scapular clocks yellow t-band 2x5      Shoulder Exercises: ROM/Strengthening   UBE (Upper Arm Bike)  60 RPM x10 min (5 min forward, 5 min backward)      Shoulder Exercises: Body Blade   External Rotation  30 seconds;2 reps    External Rotation Limitations  yellow body blade      Vasopneumatic   Number Minutes Vasopneumatic   15 minutes    Vasopnuematic Location   Shoulder;Ankle    Vasopneumatic Pressure  Low      Ankle Exercises: Standing   SLS  2  minute hold with intermittent toe touch down and UE support on balance beam                  PT Long Term Goals - 10/04/17 0830      PT LONG TERM GOAL #1   Title  Pt will be independent with a HEP and progression.     Time  8    Period  Weeks    Status  Achieved      PT LONG TERM GOAL #2   Title  Pt will improve his R SLS to >/= 30 seconds to improve balance and functional mobility.     Baseline  currently only able to SLS for about 10-15 seconds    Time  8    Period  Weeks    Status  On-going      PT LONG TERM GOAL #3   Title  Walk with normal gait pattern with no pain reported.     Baseline  10/04/17: walking with no pain however still noted with shortened step length and decreased DF/DF     Time  8    Period  Weeks    Status  On-going      PT LONG TERM GOAL #4   Title  Pt will improve his R shoulder flexion to >/= 150 degrees.     Time  8    Period  Weeks    Status  Achieved      PT LONG TERM GOAL #5   Title  Pt  will improve his R grip strength to >/= 40 ppsi.     Time  8    Period  Weeks    Status  Achieved            Plan - 10/29/17 1734    Clinical Impression Statement  Patient required multiple rest breaks secondary to increased soreness and muscle fatigue in right shoulder. Patient also required multiple toe touch downs secondary to fatigue. Continue with scapular clocks to improve shoulder stabilization. Normal response to modalities upon removal.    Clinical Presentation  Stable    Clinical Decision Making  Moderate    Rehab Potential  Good    Clinical Impairments Affecting Rehab Potential  surgery 07/15/17 current 11 weeks 09/30/17    PT Frequency  3x / week    PT Duration  4 weeks    PT Treatment/Interventions  ADLs/Self Care Home Management;Cryotherapy;Electrical Stimulation;Therapeutic exercise;Therapeutic activities;Functional mobility training;Stair training;Gait training;Ultrasound;Balance training;Neuromuscular re-education;Patient/family education;Manual techniques;Passive range of motion;Taping    PT Next Visit Plan   RTC repair protocol (no overhead or lifting greather than #10) Assess ROM; Joint mobility, rhythmic stabilization of the shoulder, Ankle ROM/strengthening, gait training     PT Home Exercise Plan  scapular retractions, shoulder rolls with emphasis on shoulder depression.    Consulted and Agree with Plan of Care  Patient       Patient will benefit from skilled therapeutic intervention in order to improve the following deficits and impairments:  Abnormal gait, Pain, Increased edema, Decreased activity tolerance, Decreased strength, Decreased balance, Decreased mobility, Difficulty walking, Impaired UE functional use, Decreased range of motion  Visit Diagnosis: Acute pain of right shoulder  Stiffness of right shoulder, not elsewhere classified  Pain in right ankle and joints of right foot  Difficulty in walking, not elsewhere classified  Gait  abnormality     Problem List Patient Active Problem List   Diagnosis Date Noted  . Diverticulitis of colon 05/03/2015  Gabriela Eves, PT, DPT 10/29/2017, 6:03 PM  [facility] [place], [place], [address] Phone: [phone]   Fax:  [phone]  Name: Shakir Petrosino MRN: [id number] Date of Birth: [birth year]

## 2017-10-31 ENCOUNTER — Ambulatory Visit: Payer: PRIVATE HEALTH INSURANCE | Admitting: Physical Therapy

## 2017-10-31 DIAGNOSIS — M25511 Pain in right shoulder: Secondary | ICD-10-CM

## 2017-10-31 DIAGNOSIS — R262 Difficulty in walking, not elsewhere classified: Secondary | ICD-10-CM

## 2017-10-31 DIAGNOSIS — M25571 Pain in right ankle and joints of right foot: Secondary | ICD-10-CM

## 2017-10-31 DIAGNOSIS — R269 Unspecified abnormalities of gait and mobility: Secondary | ICD-10-CM

## 2017-10-31 DIAGNOSIS — M25611 Stiffness of right shoulder, not elsewhere classified: Secondary | ICD-10-CM

## 2017-10-31 NOTE — Therapy (Signed)
[facility] [place], [place], [address] Phone: [phone]   Fax:  [phone]  Physical Therapy Treatment  Patient Details  Name: Aaron Krause MRN: [id number] Date of Birth: 02/03/1966 Referring Provider: Susa Day, MD   Encounter Date: 10/31/2017  PT End of Session - 10/31/17 1710    Visit Number  37    Number of Visits  40    Date for PT Re-Evaluation  11/18/17    Authorization Type  Pt has 12 visits approved from workers comp. Will need to reapply for further visits.     PT Start Time  1653    PT Stop Time  1743    PT Time Calculation (min)  50 min    Activity Tolerance  Patient tolerated treatment well    Behavior During Therapy  WFL for tasks assessed/performed       Past Medical History:  Diagnosis Date  . Arthritis   . Diverticulitis    with perforation reported  . Ruptured disk   . Trimalleolar fracture of right ankle     Past Surgical History:  Procedure Laterality Date  . ABDOMINAL SURGERY     diverticulitis perforation  . ORIF ANKLE FRACTURE Right 06/06/2017   Procedure: OPEN REDUCTION INTERNAL FIXATION (ORIF) ANKLE FRACTURE;  Surgeon: Wylene Simmer, MD;  Location: [place];  Service: Orthopedics;  Laterality: Right;  . TOTAL HIP ARTHROPLASTY      There were no vitals filed for this visit.  Subjective Assessment - 10/31/17 1755    Subjective  "I'm feeling good today."    Pertinent History  s/p right trimalleolar fx s/p surgery on 06/06/17, s/p R rotator cuff repair on 07/15/17.     Patient Stated Goals  get back to work    Currently in Pain?  No/denies         [facility] PT Assessment - 10/31/17 0001      Assessment   Medical Diagnosis  Trimalleolar Fx/dislocation right ankle, R rotator cuffer replair    Hand Dominance  Right    Next MD Visit  11/04/17                   [facility] Adult PT Treatment/Exercise - 10/31/17 0001      Shoulder Exercises: Standing    External Rotation  Strengthening;Right;Theraband;20 reps;10 reps    Theraband Level (Shoulder External Rotation)  Level 2 (Red)    Other Standing Exercises  External rotation isometric  two step outs x20    Other Standing Exercises  red swiss ball shoulder flexion, elbows on ball x30      Shoulder Exercises: ROM/Strengthening   UBE (Upper Arm Bike)  60 RPM x10 min (5 min forward, 5 min backward)      Shoulder Exercises: Body Blade   External Rotation  30 seconds;3 reps at end range ER    External Rotation Limitations  yellow body blade    Other Body Blade Exercises  shoulder depression 30 seconds x3 small body blade followed by large body blade      Vasopneumatic   Number Minutes Vasopneumatic   15 minutes    Vasopnuematic Location   Shoulder    Vasopneumatic Pressure  Low                  PT Long Term Goals - 10/04/17 0830      PT LONG TERM GOAL #1   Title  Pt will be independent with a HEP and progression.  Time  8    Period  Weeks    Status  Achieved      PT LONG TERM GOAL #2   Title  Pt will improve his R SLS to >/= 30 seconds to improve balance and functional mobility.     Baseline  currently only able to SLS for about 10-15 seconds    Time  8    Period  Weeks    Status  On-going      PT LONG TERM GOAL #3   Title  Walk with normal gait pattern with no pain reported.     Baseline  10/04/17: walking with no pain however still noted with shortened step length and decreased DF/DF     Time  8    Period  Weeks    Status  On-going      PT LONG TERM GOAL #4   Title  Pt will improve his R shoulder flexion to >/= 150 degrees.     Time  8    Period  Weeks    Status  Achieved      PT LONG TERM GOAL #5   Title  Pt will improve his R grip strength to >/= 40 ppsi.     Time  8    Period  Weeks    Status  Achieved            Plan - 10/31/17 1737    Clinical Impression Statement  Patient tolerated treatment well. Patient noted with improved shoulder  muscular endurance as noted by the ability to perform ER with theraband with only one short rest break. Focus on proprioception exercises next visit.    Clinical Presentation  Stable    Clinical Decision Making  Moderate    Rehab Potential  Good    Clinical Impairments Affecting Rehab Potential  surgery 07/15/17 current 11 weeks 09/30/17    PT Frequency  3x / week    PT Duration  4 weeks    PT Treatment/Interventions  ADLs/Self Care Home Management;Cryotherapy;Electrical Stimulation;Therapeutic exercise;Therapeutic activities;Functional mobility training;Stair training;Gait training;Ultrasound;Balance training;Neuromuscular re-education;Patient/family education;Manual techniques;Passive range of motion;Taping    PT Next Visit Plan  Route MD note to Dr. Tonita Cong; focus on proproceptive ankle exercises & shoulder joint mobilizations. No overhead lifting, no lifting 10# per MD.    Consulted and Agree with Plan of Care  Patient       Patient will benefit from skilled therapeutic intervention in order to improve the following deficits and impairments:  Abnormal gait, Pain, Increased edema, Decreased activity tolerance, Decreased strength, Decreased balance, Decreased mobility, Difficulty walking, Impaired UE functional use, Decreased range of motion  Visit Diagnosis: Acute pain of right shoulder  Stiffness of right shoulder, not elsewhere classified  Pain in right ankle and joints of right foot  Difficulty in walking, not elsewhere classified  Gait abnormality     Problem List Patient Active Problem List   Diagnosis Date Noted  . Diverticulitis of colon 05/03/2015   Gabriela Eves, PT, DPT 10/31/2017, 5:56 PM  [facility] [address] [place], [place], [address] Phone: [phone]   Fax:  [phone]  Name: Aaron Krause MRN: [id number] Date of Birth: [birth year]

## 2017-11-01 ENCOUNTER — Ambulatory Visit: Payer: PRIVATE HEALTH INSURANCE | Admitting: Physical Therapy

## 2017-11-01 DIAGNOSIS — M25511 Pain in right shoulder: Secondary | ICD-10-CM | POA: Diagnosis not present

## 2017-11-01 DIAGNOSIS — R262 Difficulty in walking, not elsewhere classified: Secondary | ICD-10-CM

## 2017-11-01 DIAGNOSIS — M25571 Pain in right ankle and joints of right foot: Secondary | ICD-10-CM

## 2017-11-01 DIAGNOSIS — R269 Unspecified abnormalities of gait and mobility: Secondary | ICD-10-CM

## 2017-11-01 DIAGNOSIS — M25611 Stiffness of right shoulder, not elsewhere classified: Secondary | ICD-10-CM

## 2017-11-01 NOTE — Therapy (Signed)
[facility] [place], [place], [address] Phone: [phone]   Fax:  [phone]  Physical Therapy Treatment  Patient Details  Name: Aaron Krause MRN: [id number] Date of Birth: 06-Nov-1965 Referring Provider: Susa Day, MD   Encounter Date: 11/01/2017  PT End of Session - 11/01/17 0733    Visit Number  38    Number of Visits  40    Date for PT Re-Evaluation  11/18/17    Authorization Type  Pt has 12 visits approved from workers comp. Will need to reapply for further visits.     PT Start Time  [phone]    PT Stop Time  0820    PT Time Calculation (min)  46 min    Activity Tolerance  Patient tolerated treatment well    Behavior During Therapy  [facility] for tasks assessed/performed       Past Medical History:  Diagnosis Date  . Arthritis   . Diverticulitis    with perforation reported  . Ruptured disk   . Trimalleolar fracture of right ankle     Past Surgical History:  Procedure Laterality Date  . ABDOMINAL SURGERY     diverticulitis perforation  . ORIF ANKLE FRACTURE Right 06/06/2017   Procedure: OPEN REDUCTION INTERNAL FIXATION (ORIF) ANKLE FRACTURE;  Surgeon: Wylene Simmer, MD;  Location: [place];  Service: Orthopedics;  Laterality: Right;  . TOTAL HIP ARTHROPLASTY      There were no vitals filed for this visit.  Subjective Assessment - 11/01/17 1127    Subjective  Patient reported no new complaints.    Pertinent History  s/p right trimalleolar fx s/p surgery on 06/06/17, s/p R rotator cuff repair on 07/15/17.     Patient Stated Goals  get back to work         [facility] PT Assessment - 11/01/17 0001      Assessment   Medical Diagnosis  Trimalleolar Fx/dislocation right ankle, R rotator cuffer replair    Hand Dominance  Right    Next MD Visit  11/04/17      AROM   Right Ankle Dorsiflexion  7    Right Ankle Plantar Flexion  35    Right Ankle Inversion  20    Right Ankle Eversion  22      PROM    Right Shoulder Flexion  150 Degrees    Right Shoulder ABduction  120 Degrees    Right Shoulder External Rotation  65 Degrees                   [facility] Adult PT Treatment/Exercise - 11/01/17 0001      Shoulder Exercises: ROM/Strengthening   UBE (Upper Arm Bike)  60 RPM x10 min (5 min forward, 5 min backward)      Manual Therapy   Manual Therapy  Passive ROM;Joint mobilization    Manual therapy comments  right PROM shoulder, flexion and ER to improve shoulder movement    Joint Mobilization  Grade III R shoulder posterior and inferior glides and distraction with oscillations      Ankle Exercises: Standing   SLS  2 minute hold with intermittent toe touch down and UE support on balance beam    Heel Raises  --    Balance Beam  tandem walking, retrowalking, and lateral walking in mini squat, no UE support    Braiding (Round Trip)  2x on airex no UE support    Other Standing Ankle Exercises  good morning  SLS x15 with UE support                  PT Long Term Goals - 10/04/17 0830      PT LONG TERM GOAL #1   Title  Pt will be independent with a HEP and progression.     Time  8    Period  Weeks    Status  Achieved      PT LONG TERM GOAL #2   Title  Pt will improve his R SLS to >/= 30 seconds to improve balance and functional mobility.     Baseline  currently only able to SLS for about 10-15 seconds    Time  8    Period  Weeks    Status  On-going      PT LONG TERM GOAL #3   Title  Walk with normal gait pattern with no pain reported.     Baseline  10/04/17: walking with no pain however still noted with shortened step length and decreased DF/DF     Time  8    Period  Weeks    Status  On-going      PT LONG TERM GOAL #4   Title  Pt will improve his R shoulder flexion to >/= 150 degrees.     Time  8    Period  Weeks    Status  Achieved      PT LONG TERM GOAL #5   Title  Pt will improve his R grip strength to >/= 40 ppsi.     Time  8    Period  Weeks     Status  Achieved            Plan - 11/01/17 1116    Clinical Impression Statement  Patient was able to tolerate treatment well; patient stated he felt his ankle muscles activating with good morning exercise. Patient noted with smooth arc of motion with PROM flexion and abduction. Patient still noted with shoulder elevation compensation during AROM shoulder flexion and abduction, with respect to no overhead protocol. Patient's PROM assessed. Patient opted out of vasopneumatic device today.    Clinical Presentation  Stable    Clinical Decision Making  Moderate    Rehab Potential  Good    Clinical Impairments Affecting Rehab Potential  surgery 07/15/17 current 11 weeks 09/30/17    PT Frequency  3x / week    PT Duration  4 weeks    PT Treatment/Interventions  ADLs/Self Care Home Management;Cryotherapy;Electrical Stimulation;Therapeutic exercise;Therapeutic activities;Functional mobility training;Stair training;Gait training;Ultrasound;Balance training;Neuromuscular re-education;Patient/family education;Manual techniques;Passive range of motion;Taping    PT Next Visit Plan  Continue shoulder stabilization and strengthening exercises with respect to protocol: No overhead lifting, no lifting 10# per MD. Proproceptive exercises for ankle stability. Modalities PRN.    Consulted and Agree with Plan of Care  Patient       Patient will benefit from skilled therapeutic intervention in order to improve the following deficits and impairments:  Abnormal gait, Pain, Increased edema, Decreased activity tolerance, Decreased strength, Decreased balance, Decreased mobility, Difficulty walking, Impaired UE functional use, Decreased range of motion  Visit Diagnosis: Acute pain of right shoulder  Stiffness of right shoulder, not elsewhere classified  Pain in right ankle and joints of right foot  Difficulty in walking, not elsewhere classified  Gait abnormality     Problem List Patient Active Problem  List   Diagnosis Date Noted  . Diverticulitis of colon 05/03/2015    Torrie Mayers  Eliyanah Elgersma, PT, DPT 11/01/2017, 11:31 AM  [facility] [place], [place], [address] Phone: [phone]   Fax:  [phone]  Name: Aaron Krause MRN: [id number] Date of Birth: Dec 17, 1965

## 2017-11-05 ENCOUNTER — Ambulatory Visit: Payer: PRIVATE HEALTH INSURANCE | Admitting: Physical Therapy

## 2017-11-05 DIAGNOSIS — M25611 Stiffness of right shoulder, not elsewhere classified: Secondary | ICD-10-CM

## 2017-11-05 DIAGNOSIS — R262 Difficulty in walking, not elsewhere classified: Secondary | ICD-10-CM

## 2017-11-05 DIAGNOSIS — R269 Unspecified abnormalities of gait and mobility: Secondary | ICD-10-CM

## 2017-11-05 DIAGNOSIS — M25511 Pain in right shoulder: Secondary | ICD-10-CM

## 2017-11-05 DIAGNOSIS — M25571 Pain in right ankle and joints of right foot: Secondary | ICD-10-CM

## 2017-11-05 NOTE — Therapy (Signed)
[facility] [place], [place], [address] Phone: [phone]   Fax:  [phone]  Physical Therapy Treatment  Patient Details  Name: Aaron Krause MRN: [id number] Date of Birth: [birth year] Referring Provider: Susa Day, MD   Encounter Date: 11/05/2017  PT End of Session - 11/05/17 1658    Visit Number  39    Number of Visits  58    Date for PT Re-Evaluation  11/29/17    Authorization Type  Pt has 9 visits approved from workers comp. Will need to reapply for further visits.     PT Start Time  1653    PT Stop Time  1743    PT Time Calculation (min)  50 min    Activity Tolerance  Patient tolerated treatment well    Behavior During Therapy  WFL for tasks assessed/performed       Past Medical History:  Diagnosis Date  . Arthritis   . Diverticulitis    with perforation reported  . Ruptured disk   . Trimalleolar fracture of right ankle     Past Surgical History:  Procedure Laterality Date  . ABDOMINAL SURGERY     diverticulitis perforation  . ORIF ANKLE FRACTURE Right 06/06/2017   Procedure: OPEN REDUCTION INTERNAL FIXATION (ORIF) ANKLE FRACTURE;  Surgeon: Wylene Simmer, MD;  Location: [place];  Service: Orthopedics;  Laterality: Right;  . TOTAL HIP ARTHROPLASTY      There were no vitals filed for this visit.  Subjective Assessment - 11/05/17 1700    Subjective  Patient reported doctor's apointment went well, stated still no overhead lifting and no lifting greater than 10#. Patient stated ankle begins to fatigue abour 3/4 of the way into work and the shoulder is making improvements in endurance daily.    Pertinent History  s/p right trimalleolar fx s/p surgery on 06/06/17, s/p R rotator cuff repair on 07/15/17.     Patient Stated Goals  get back to work    Currently in Pain?  No/denies         [facility] PT Assessment - 11/05/17 0001      Assessment   Medical Diagnosis  Trimalleolar Fx/dislocation  right ankle, R rotator cuffer replair    Hand Dominance  Right                   [facility] Adult PT Treatment/Exercise - 11/05/17 0001      Shoulder Exercises: Standing   Other Standing Exercises  --    Other Standing Exercises  2# ball catches eccentric control of ERs x20. followed  by scapular clocks (circles) yellow band 3x5 followed by shoulder flexion with elbows on wall and hori abd yellow t-band 3x5      Shoulder Exercises: ROM/Strengthening   UBE (Upper Arm Bike)  60 RPM x10 min (5 min forward, 5 min backward)      Vasopneumatic   Number Minutes Vasopneumatic   15 minutes    Vasopnuematic Location   Shoulder    Vasopneumatic Pressure  Low      Ankle Exercises: Standing   SLS  2 minute hold with intermittent toe touch down and UE support                   PT Long Term Goals - 10/04/17 0830      PT LONG TERM GOAL #1   Title  Pt will be independent with a HEP and progression.     Time  8  Period  Weeks    Status  Achieved      PT LONG TERM GOAL #2   Title  Pt will improve his R SLS to >/= 30 seconds to improve balance and functional mobility.     Baseline  currently only able to SLS for about 10-15 seconds    Time  8    Period  Weeks    Status  On-going      PT LONG TERM GOAL #3   Title  Walk with normal gait pattern with no pain reported.     Baseline  10/04/17: walking with no pain however still noted with shortened step length and decreased DF/DF     Time  8    Period  Weeks    Status  On-going      PT LONG TERM GOAL #4   Title  Pt will improve his R shoulder flexion to >/= 150 degrees.     Time  8    Period  Weeks    Status  Achieved      PT LONG TERM GOAL #5   Title  Pt will improve his R grip strength to >/= 40 ppsi.     Time  8    Period  Weeks    Status  Achieved            Plan - 11/05/17 2047    Clinical Impression Statement  Patient reported increased muscle fatigue with scapular exercises. Patient still has  difficulty with performing R SLS. Patient able to perform for 6 seconds without use of UE or toe touch down for support. Patient and PT discussed patient is still healing and despite ongoing sorness, there is progress that has been made. Patient deferred vaso for ankle. Normal response to vasopneumatic device upon removal.    Clinical Presentation  Stable    Clinical Decision Making  Moderate    Rehab Potential  Good    Clinical Impairments Affecting Rehab Potential  surgery 07/15/17 current 16 weeks 11/04/17    PT Frequency  3x / week    PT Duration  4 weeks    PT Treatment/Interventions  ADLs/Self Care Home Management;Cryotherapy;Electrical Stimulation;Therapeutic exercise;Therapeutic activities;Functional mobility training;Stair training;Gait training;Ultrasound;Balance training;Neuromuscular re-education;Patient/family education;Manual techniques;Passive range of motion;Taping    PT Next Visit Plan  Continue shoulder stabilization and strengthening exercises with respect to protocol: No overhead lifting, no lifting 10# per MD. Proproceptive exercises for ankle stability. Modalities PRN.    Consulted and Agree with Plan of Care  Patient       Patient will benefit from skilled therapeutic intervention in order to improve the following deficits and impairments:  Abnormal gait, Pain, Increased edema, Decreased activity tolerance, Decreased strength, Decreased balance, Decreased mobility, Difficulty walking, Impaired UE functional use, Decreased range of motion  Visit Diagnosis: Acute pain of right shoulder  Stiffness of right shoulder, not elsewhere classified  Pain in right ankle and joints of right foot  Difficulty in walking, not elsewhere classified  Gait abnormality     Problem List Patient Active Problem List   Diagnosis Date Noted  . Diverticulitis of colon 05/03/2015   Gabriela Eves, PT, DPT 11/05/2017, 8:57 PM  [facility] [address] [place], [place], [address] Phone: [phone]   Fax:  [phone]  Name: Aaron Krause MRN: [id number] Date of Birth: 11-27-1965

## 2017-11-07 ENCOUNTER — Ambulatory Visit: Payer: PRIVATE HEALTH INSURANCE | Admitting: Physical Therapy

## 2017-11-07 DIAGNOSIS — M25611 Stiffness of right shoulder, not elsewhere classified: Secondary | ICD-10-CM

## 2017-11-07 DIAGNOSIS — R262 Difficulty in walking, not elsewhere classified: Secondary | ICD-10-CM

## 2017-11-07 DIAGNOSIS — M25571 Pain in right ankle and joints of right foot: Secondary | ICD-10-CM

## 2017-11-07 DIAGNOSIS — R269 Unspecified abnormalities of gait and mobility: Secondary | ICD-10-CM

## 2017-11-07 DIAGNOSIS — M25511 Pain in right shoulder: Secondary | ICD-10-CM

## 2017-11-07 NOTE — Therapy (Signed)
[facility] [place], [place], [address] Phone: [phone]   Fax:  [phone]  Physical Therapy Treatment  Patient Details  Name: Aaron Krause MRN: [id number] Date of Birth: May 29, 1966 Referring Provider: Susa Day, MD   Encounter Date: 11/07/2017  PT End of Session - 11/07/17 1738    Visit Number  40    Number of Visits  62    Date for PT Re-Evaluation  11/29/17    Authorization Type  Pt has 9 visits approved from workers comp. Will need to reapply for further visits.     PT Start Time  1647    PT Stop Time  1738    PT Time Calculation (min)  51 min    Activity Tolerance  Patient tolerated treatment well    Behavior During Therapy  WFL for tasks assessed/performed       Past Medical History:  Diagnosis Date  . Arthritis   . Diverticulitis    with perforation reported  . Ruptured disk   . Trimalleolar fracture of right ankle     Past Surgical History:  Procedure Laterality Date  . ABDOMINAL SURGERY     diverticulitis perforation  . ORIF ANKLE FRACTURE Right 06/06/2017   Procedure: OPEN REDUCTION INTERNAL FIXATION (ORIF) ANKLE FRACTURE;  Surgeon: Wylene Simmer, MD;  Location: [place];  Service: Orthopedics;  Laterality: Right;  . TOTAL HIP ARTHROPLASTY      There were no vitals filed for this visit.  Subjective Assessment - 11/07/17 1737    Subjective  Patient reported no new complaints. Shoulder and ankle are sore after work.    Pertinent History  s/p right trimalleolar fx s/p surgery on 06/06/17, s/p R rotator cuff repair on 07/15/17.     Patient Stated Goals  get back to work; improve strength    Currently in Pain?  No/denies         [facility] PT Assessment - 11/07/17 0001      Assessment   Medical Diagnosis  Trimalleolar Fx/dislocation right ankle, R rotator cuffer replair    Hand Dominance  Right      Precautions   Precautions  --    Precaution Comments  no overhead lifting  or lifting greater than #10                   [facility] Adult PT Treatment/Exercise - 11/07/17 0001      Shoulder Exercises: Standing   External Rotation  Strengthening;Right;Theraband;20 reps;10 reps    Theraband Level (Shoulder External Rotation)  Level 2 (Red)    Extension  Strengthening;Both;10 reps    Extension Limitations  Pink XTS    Other Standing Exercises  External rotation isometric  two step outs x20    Other Standing Exercises  mid rows pink xts x20      Shoulder Exercises: ROM/Strengthening   UBE (Upper Arm Bike)  60 RPM x10 min (5 min forward, 5 min backward)      Shoulder Exercises: Stretch   Corner Stretch  3 reps;30 seconds      Shoulder Exercises: Body Blade   Flexion  3 reps;Other (comment) 1 rep 30 seconds, 3x10 seconds.      Vasopneumatic   Number Minutes Vasopneumatic   15 minutes    Vasopnuematic Location   Shoulder    Vasopneumatic Pressure  Medium                  PT Long Term Goals - 10/04/17  0830      PT LONG TERM GOAL #1   Title  Pt will be independent with a HEP and progression.     Time  8    Period  Weeks    Status  Achieved      PT LONG TERM GOAL #2   Title  Pt will improve his R SLS to >/= 30 seconds to improve balance and functional mobility.     Baseline  currently only able to SLS for about 10-15 seconds    Time  8    Period  Weeks    Status  On-going      PT LONG TERM GOAL #3   Title  Walk with normal gait pattern with no pain reported.     Baseline  10/04/17: walking with no pain however still noted with shortened step length and decreased DF/DF     Time  8    Period  Weeks    Status  On-going      PT LONG TERM GOAL #4   Title  Pt will improve his R shoulder flexion to >/= 150 degrees.     Time  8    Period  Weeks    Status  Achieved      PT LONG TERM GOAL #5   Title  Pt will improve his R grip strength to >/= 40 ppsi.     Time  8    Period  Weeks    Status  Achieved            Plan -  11/07/17 1739    Clinical Impression Statement  Patient was able to complete exercises despite reports of R shoulder muscle fatigue. Patient continues to demonstrate shoulder elevation compensation when flexing right shoulder however patient demonstrates strong carryover of cuing by depressing shoulders. Focus on joint mobility and ankle proprioceptive exercises next visit. Normal response to modalities upon removal.    Clinical Presentation  Stable    Clinical Decision Making  Moderate    Rehab Potential  Good    Clinical Impairments Affecting Rehab Potential  surgery 07/15/17 current 16 weeks 11/04/17    PT Frequency  3x / week    PT Duration  4 weeks    PT Next Visit Plan  Continue shoulder stabilization and strengthening exercises with respect to protocol: No overhead lifting, no lifting 10# per MD. Proproceptive exercises for ankle stability. Modalities PRN.    Consulted and Agree with Plan of Care  Patient       Patient will benefit from skilled therapeutic intervention in order to improve the following deficits and impairments:  Abnormal gait, Pain, Increased edema, Decreased activity tolerance, Decreased strength, Decreased balance, Decreased mobility, Difficulty walking, Impaired UE functional use, Decreased range of motion  Visit Diagnosis: Acute pain of right shoulder  Stiffness of right shoulder, not elsewhere classified  Pain in right ankle and joints of right foot  Difficulty in walking, not elsewhere classified  Gait abnormality     Problem List Patient Active Problem List   Diagnosis Date Noted  . Diverticulitis of colon 05/03/2015    Gabriela Eves 11/07/2017, 5:49 PM  [facility] [address] [place], [place], [address] Phone: [phone]   Fax:  [phone]  Name: Aaron Krause MRN: [id number] Date of Birth: 1966/03/08

## 2017-11-08 ENCOUNTER — Ambulatory Visit: Payer: PRIVATE HEALTH INSURANCE | Admitting: Physical Therapy

## 2017-11-08 DIAGNOSIS — R262 Difficulty in walking, not elsewhere classified: Secondary | ICD-10-CM

## 2017-11-08 DIAGNOSIS — M25571 Pain in right ankle and joints of right foot: Secondary | ICD-10-CM

## 2017-11-08 DIAGNOSIS — M25511 Pain in right shoulder: Secondary | ICD-10-CM

## 2017-11-08 DIAGNOSIS — M25611 Stiffness of right shoulder, not elsewhere classified: Secondary | ICD-10-CM

## 2017-11-08 DIAGNOSIS — R269 Unspecified abnormalities of gait and mobility: Secondary | ICD-10-CM

## 2017-11-08 NOTE — Therapy (Signed)
[facility] [place], [place], [address] Phone: [phone]   Fax:  [phone]  Physical Therapy Treatment  Patient Details  Name: Aaron Krause MRN: [id number] Date of Birth: February 26, 1966 Referring Provider: Susa Day, MD   Encounter Date: 11/08/2017  PT End of Session - 11/08/17 0728    Visit Number  41    Number of Visits  57    Date for PT Re-Evaluation  11/29/17    Authorization Type  Pt has 9 visits approved from workers comp. Will need to reapply for further visits.     PT Start Time  0728    PT Stop Time  0821    PT Time Calculation (min)  53 min    Activity Tolerance  Patient tolerated treatment well    Behavior During Therapy  [facility] for tasks assessed/performed       Past Medical History:  Diagnosis Date  . Arthritis   . Diverticulitis    with perforation reported  . Ruptured disk   . Trimalleolar fracture of right ankle     Past Surgical History:  Procedure Laterality Date  . ABDOMINAL SURGERY     diverticulitis perforation  . ORIF ANKLE FRACTURE Right 06/06/2017   Procedure: OPEN REDUCTION INTERNAL FIXATION (ORIF) ANKLE FRACTURE;  Surgeon: Wylene Simmer, MD;  Location: [place];  Service: Orthopedics;  Laterality: Right;  . TOTAL HIP ARTHROPLASTY      There were no vitals filed for this visit.  Subjective Assessment - 11/08/17 0730    Subjective  Patient reported feeling good, more soreness in right shoulder than right ankle    Pertinent History  s/p right trimalleolar fx s/p surgery on 06/06/17, s/p R rotator cuff repair on 07/15/17.     Patient Stated Goals  get back to work; improve strength    Currently in Pain?  No/denies         [facility] PT Assessment - 11/08/17 0001      Assessment   Medical Diagnosis  Trimalleolar Fx/dislocation right ankle, R rotator cuffer replair    Hand Dominance  Right      Precautions   Precaution Comments  no overhead lifting or lifting greater  than #10                   [facility] Adult PT Treatment/Exercise - 11/08/17 0001      Shoulder Exercises: ROM/Strengthening   UBE (Upper Arm Bike)  60 RPM x10 min (5 min forward, 5 min backward)      Vasopneumatic   Number Minutes Vasopneumatic   15 minutes    Vasopnuematic Location   Ankle;Shoulder    Vasopneumatic Pressure  Low      Manual Therapy   Manual Therapy  Passive ROM;Joint mobilization    Passive ROM  PROM into right shoulder flexion and ER Grade III-IV Posterior joint mobs, inferior glides, and long axis distraction to improve ROM followed by manual rhythmic stabilization in supine at 90 degrees and 45 degrees, proximal and distal perturbations 3x15 each      Ankle Exercises: Standing   SLS  with color pod taps in triangle x20 each    Other Standing Ankle Exercises  good morning SLS x15 with UE support    Other Standing Ankle Exercises  NBOS standing on inverted bosu x2 minutes followed by posterior joint mobilization with red band on 6 inch step x30      Ankle Exercises: Stretches   Other Stretch  standing prostretch 4x30 seconds                  PT Long Term Goals - 10/04/17 0830      PT LONG TERM GOAL #1   Title  Pt will be independent with a HEP and progression.     Time  8    Period  Weeks    Status  Achieved      PT LONG TERM GOAL #2   Title  Pt will improve his R SLS to >/= 30 seconds to improve balance and functional mobility.     Baseline  currently only able to SLS for about 10-15 seconds    Time  8    Period  Weeks    Status  On-going      PT LONG TERM GOAL #3   Title  Walk with normal gait pattern with no pain reported.     Baseline  10/04/17: walking with no pain however still noted with shortened step length and decreased DF/DF     Time  8    Period  Weeks    Status  On-going      PT LONG TERM GOAL #4   Title  Pt will improve his R shoulder flexion to >/= 150 degrees.     Time  8    Period  Weeks    Status  Achieved       PT LONG TERM GOAL #5   Title  Pt will improve his R grip strength to >/= 40 ppsi.     Time  8    Period  Weeks    Status  Achieved            Plan - 11/08/17 2952    Clinical Impression Statement  Patient noted with improved posterior joint mobilization; patient still noted increased muscle soreness and "tightness" in end range abd and flexion. Patient was able to tolerate ankle proprioceptive exercises with minimal reports of soreness. Continue SLS exercises to achieve goal. Normal response to modalitites upon removal.    Clinical Presentation  Stable    Clinical Decision Making  Moderate    Rehab Potential  Good    Clinical Impairments Affecting Rehab Potential  surgery 07/15/17 current 16 weeks 11/04/17    PT Frequency  3x / week    PT Duration  4 weeks    PT Treatment/Interventions  ADLs/Self Care Home Management;Cryotherapy;Electrical Stimulation;Therapeutic exercise;Therapeutic activities;Functional mobility training;Stair training;Gait training;Ultrasound;Balance training;Neuromuscular re-education;Patient/family education;Manual techniques;Passive range of motion;Taping    PT Next Visit Plan  Continue shoulder stabilization and strengthening exercises with respect to protocol: No overhead lifting, no lifting 10# per MD. Proproceptive exercises for ankle stability. Modalities PRN.    PT Home Exercise Plan  serratus punches    Consulted and Agree with Plan of Care  Patient       Patient will benefit from skilled therapeutic intervention in order to improve the following deficits and impairments:  Abnormal gait, Pain, Increased edema, Decreased activity tolerance, Decreased strength, Decreased balance, Decreased mobility, Difficulty walking, Impaired UE functional use, Decreased range of motion  Visit Diagnosis: Acute pain of right shoulder  Stiffness of right shoulder, not elsewhere classified  Pain in right ankle and joints of right foot  Difficulty in walking, not  elsewhere classified  Gait abnormality     Problem List Patient Active Problem List   Diagnosis Date Noted  . Diverticulitis of colon 05/03/2015    Gabriela Eves, PT, DPT 11/08/2017, 8:32  [facility] [place], [place], [address] Phone: [phone]   Fax:  [phone]  Name: Aaron Krause MRN: [id number] Date of Birth: 1966-02-18

## 2017-11-12 ENCOUNTER — Ambulatory Visit: Payer: PRIVATE HEALTH INSURANCE | Admitting: Physical Therapy

## 2017-11-12 DIAGNOSIS — M25611 Stiffness of right shoulder, not elsewhere classified: Secondary | ICD-10-CM

## 2017-11-12 DIAGNOSIS — M25571 Pain in right ankle and joints of right foot: Secondary | ICD-10-CM

## 2017-11-12 DIAGNOSIS — M25511 Pain in right shoulder: Secondary | ICD-10-CM | POA: Diagnosis not present

## 2017-11-12 DIAGNOSIS — R262 Difficulty in walking, not elsewhere classified: Secondary | ICD-10-CM

## 2017-11-12 DIAGNOSIS — R269 Unspecified abnormalities of gait and mobility: Secondary | ICD-10-CM

## 2017-11-12 NOTE — Therapy (Signed)
[facility] [place], [place], [address] Phone: [phone]   Fax:  [phone]  Physical Therapy Treatment  Patient Details  Name: Aaron Krause MRN: [id number] Date of Birth: 1965/08/27 Referring Provider: Susa Day, MD   Encounter Date: 11/12/2017  PT End of Session - 11/12/17 1701    Visit Number  42    Number of Visits  80    Date for PT Re-Evaluation  11/29/17    Authorization Type  Pt has 9 visits approved from workers comp. Will need to reapply for further visits.     PT Start Time  1651    PT Stop Time  1740    PT Time Calculation (min)  49 min    Activity Tolerance  Patient tolerated treatment well    Behavior During Therapy  WFL for tasks assessed/performed       Past Medical History:  Diagnosis Date  . Arthritis   . Diverticulitis    with perforation reported  . Ruptured disk   . Trimalleolar fracture of right ankle     Past Surgical History:  Procedure Laterality Date  . ABDOMINAL SURGERY     diverticulitis perforation  . ORIF ANKLE FRACTURE Right 06/06/2017   Procedure: OPEN REDUCTION INTERNAL FIXATION (ORIF) ANKLE FRACTURE;  Surgeon: Wylene Simmer, MD;  Location: [place];  Service: Orthopedics;  Laterality: Right;  . TOTAL HIP ARTHROPLASTY      There were no vitals filed for this visit.  Subjective Assessment - 11/12/17 1743    Subjective  Patient feeling okay but has a bad headache.    Pertinent History  s/p right trimalleolar fx s/p surgery on 06/06/17, s/p R rotator cuff repair on 07/15/17.     Patient Stated Goals  get back to work; improve strength    Currently in Pain?  No/denies         [facility] PT Assessment - 11/12/17 0001      Assessment   Medical Diagnosis  Trimalleolar Fx/dislocation right ankle, R rotator cuffer replair    Hand Dominance  Right                   [facility] Adult PT Treatment/Exercise - 11/12/17 0001      Shoulder Exercises: Prone    Retraction  Strengthening;Right;10 reps;20 reps;Weights    Retraction Weight (lbs)  4    Extension  Strengthening;Right;10 reps;20 reps;Weights    Extension Weight (lbs)  4    Horizontal ABduction 1  AROM;Strengthening;Right;10 reps;20 reps;Weights    Horizontal ABduction 1 Weight (lbs)  4      Shoulder Exercises: Standing   External Rotation  Strengthening;Right;Theraband;20 reps;10 reps    Theraband Level (Shoulder External Rotation)  Level 2 (Red)    Extension  Strengthening;Both;10 reps    Extension Limitations  Pink XTS    Other Standing Exercises  External rotation isometric  two step outs x20      Shoulder Exercises: ROM/Strengthening   UBE (Upper Arm Bike)  30 RPM x6 min (3' forward, 3' backward)      Vasopneumatic   Number Minutes Vasopneumatic   15 minutes    Vasopnuematic Location   Ankle;Shoulder    Vasopneumatic Pressure  Low      Ankle Exercises: Standing   SLS  SLS with intermittent UE and toe touch down. x2 minutes    Heel Raises  15 reps off step full ROM  PT Long Term Goals - 10/04/17 0830      PT LONG TERM GOAL #1   Title  Pt will be independent with a HEP and progression.     Time  8    Period  Weeks    Status  Achieved      PT LONG TERM GOAL #2   Title  Pt will improve his R SLS to >/= 30 seconds to improve balance and functional mobility.     Baseline  currently only able to SLS for about 10-15 seconds    Time  8    Period  Weeks    Status  On-going      PT LONG TERM GOAL #3   Title  Walk with normal gait pattern with no pain reported.     Baseline  10/04/17: walking with no pain however still noted with shortened step length and decreased DF/DF     Time  8    Period  Weeks    Status  On-going      PT LONG TERM GOAL #4   Title  Pt will improve his R shoulder flexion to >/= 150 degrees.     Time  8    Period  Weeks    Status  Achieved      PT LONG TERM GOAL #5   Title  Pt will improve his R grip strength to >/=  40 ppsi.     Time  8    Period  Weeks    Status  Achieved            Plan - 11/12/17 1745    Clinical Impression Statement  Patient was able to tolerate treatment well with increase of weight for prone exercises. Patient noted with increased muscle fatigue with heel raises. Normal response to modalities upon removal.    Clinical Presentation  Stable    Clinical Decision Making  Low    Rehab Potential  Good    Clinical Impairments Affecting Rehab Potential  surgery 07/15/17 current 16 weeks 11/04/17    PT Frequency  3x / week    PT Duration  4 weeks    PT Treatment/Interventions  ADLs/Self Care Home Management;Cryotherapy;Electrical Stimulation;Therapeutic exercise;Therapeutic activities;Functional mobility training;Stair training;Gait training;Ultrasound;Balance training;Neuromuscular re-education;Patient/family education;Manual techniques;Passive range of motion;Taping    PT Next Visit Plan  Continue shoulder stabilization and strengthening exercises with respect to protocol: No overhead lifting, no lifting 10# per MD. Proproceptive exercises for ankle stability. Modalities PRN.    Consulted and Agree with Plan of Care  Patient       Patient will benefit from skilled therapeutic intervention in order to improve the following deficits and impairments:  Abnormal gait, Pain, Increased edema, Decreased activity tolerance, Decreased strength, Decreased balance, Decreased mobility, Difficulty walking, Impaired UE functional use, Decreased range of motion  Visit Diagnosis: Acute pain of right shoulder  Stiffness of right shoulder, not elsewhere classified  Pain in right ankle and joints of right foot  Gait abnormality  Difficulty in walking, not elsewhere classified     Problem List Patient Active Problem List   Diagnosis Date Noted  . Diverticulitis of colon 05/03/2015   Gabriela Eves, PT, DPT 11/12/2017, 6:02 PM  [facility] [address] [place], [place], [address] Phone: [phone]   Fax:  [phone]  Name: Aaron Krause MRN: [id number] Date of Birth: 04/03/1966

## 2017-11-14 ENCOUNTER — Ambulatory Visit: Payer: PRIVATE HEALTH INSURANCE | Admitting: Physical Therapy

## 2017-11-19 ENCOUNTER — Ambulatory Visit: Payer: PRIVATE HEALTH INSURANCE | Admitting: Physical Therapy

## 2017-11-19 DIAGNOSIS — M25611 Stiffness of right shoulder, not elsewhere classified: Secondary | ICD-10-CM

## 2017-11-19 DIAGNOSIS — M25511 Pain in right shoulder: Secondary | ICD-10-CM

## 2017-11-19 DIAGNOSIS — R269 Unspecified abnormalities of gait and mobility: Secondary | ICD-10-CM

## 2017-11-19 DIAGNOSIS — M25571 Pain in right ankle and joints of right foot: Secondary | ICD-10-CM

## 2017-11-19 DIAGNOSIS — R262 Difficulty in walking, not elsewhere classified: Secondary | ICD-10-CM

## 2017-11-19 NOTE — Therapy (Signed)
[facility] [place], [place], [address] Phone: [phone]   Fax:  [phone]  Physical Therapy Treatment  Patient Details  Name: Aaron Krause MRN: [id number] Date of Birth: Dec 09, 1965 Referring Provider: Susa Day, MD   Encounter Date: 11/19/2017  PT End of Session - 11/19/17 1653    Visit Number  43    Number of Visits  90    Date for PT Re-Evaluation  11/29/17    Authorization Type  Pt has 9 visits approved from workers comp. Will need to reapply for further visits.     Authorization Time Period  1 month    Authorization - Visit Number  24    PT Start Time  4818    PT Stop Time  1738    PT Time Calculation (min)  52 min    Activity Tolerance  Patient tolerated treatment well    Behavior During Therapy  WFL for tasks assessed/performed       Past Medical History:  Diagnosis Date  . Arthritis   . Diverticulitis    with perforation reported  . Ruptured disk   . Trimalleolar fracture of right ankle     Past Surgical History:  Procedure Laterality Date  . ABDOMINAL SURGERY     diverticulitis perforation  . ORIF ANKLE FRACTURE Right 06/06/2017   Procedure: OPEN REDUCTION INTERNAL FIXATION (ORIF) ANKLE FRACTURE;  Surgeon: Wylene Simmer, MD;  Location: [facility];  Service: Orthopedics;  Laterality: Right;  . TOTAL HIP ARTHROPLASTY      There were no vitals filed for this visit.  Subjective Assessment - 11/19/17 1654    Subjective  Patient reported feeling pretty good. Patient noted with just muscle fatigue. Patient stated he has been experiencing burning sensation the longer he stands/walks on his right LE.     Pertinent History  s/p right trimalleolar fx s/p surgery on 06/06/17, s/p R rotator cuff repair on 07/15/17.     Patient Stated Goals  get back to work; improve strength    Currently in Pain?  No/denies         [facility] PT Assessment - 11/19/17 0001      Assessment   Medical  Diagnosis  Trimalleolar Fx/dislocation right ankle, R rotator cuffer replair    Hand Dominance  Right                   [facility] Adult PT Treatment/Exercise - 11/19/17 0001      Shoulder Exercises: Standing   External Rotation  Strengthening;Right;Theraband;20 reps;10 reps    Theraband Level (Shoulder External Rotation)  Level 2 (Red);Level 3 (Green)    Other Standing Exercises  External rotation isometric red theraband two step outs x20      Shoulder Exercises: ROM/Strengthening   UBE (Upper Arm Bike)  30 RPM x6 min (3' forward, 3' backward)      Shoulder Exercises: Stretch   Corner Stretch  3 reps;30 seconds    Cross Chest Stretch  3 reps;30 seconds      Shoulder Exercises: Body Blade   External Rotation  3 reps;15 seconds    External Rotation Limitations  large yellow body blade      Vasopneumatic   Number Minutes Vasopneumatic   15 minutes    Vasopnuematic Location   Ankle;Shoulder    Vasopneumatic Pressure  Low      Ankle Exercises: Standing   SLS  SLS with intermittent UE and toe touch down. x2 minutes  Other Standing Ankle Exercises  wall slides x20                  PT Long Term Goals - 10/04/17 0830      PT LONG TERM GOAL #1   Title  Pt will be independent with a HEP and progression.     Time  8    Period  Weeks    Status  Achieved      PT LONG TERM GOAL #2   Title  Pt will improve his R SLS to >/= 30 seconds to improve balance and functional mobility.     Baseline  currently only able to SLS for about 10-15 seconds    Time  8    Period  Weeks    Status  On-going      PT LONG TERM GOAL #3   Title  Walk with normal gait pattern with no pain reported.     Baseline  10/04/17: walking with no pain however still noted with shortened step length and decreased DF/DF     Time  8    Period  Weeks    Status  On-going      PT LONG TERM GOAL #4   Title  Pt will improve his R shoulder flexion to >/= 150 degrees.     Time  8    Period  Weeks     Status  Achieved      PT LONG TERM GOAL #5   Title  Pt will improve his R grip strength to >/= 40 ppsi.     Time  8    Period  Weeks    Status  Achieved            Plan - 11/19/17 1736    Clinical Impression Statement  Patient was able to complete treatment however patient reported shoulder muscle fatigue. Patient noted with increased "burning sensation" in lateral aspect of the ankle during SLS. Patient noted he experiences sensation after spending all day standing/walking on ankle. Patient reported sensation dissipated after Vaso.     Clinical Presentation  Stable    Clinical Decision Making  Low    Rehab Potential  Good    Clinical Impairments Affecting Rehab Potential  surgery 07/15/17 current 16 weeks 11/04/17    PT Frequency  3x / week    PT Duration  4 weeks    PT Treatment/Interventions  ADLs/Self Care Home Management;Cryotherapy;Electrical Stimulation;Therapeutic exercise;Therapeutic activities;Functional mobility training;Stair training;Gait training;Ultrasound;Balance training;Neuromuscular re-education;Patient/family education;Manual techniques;Passive range of motion;Taping    PT Next Visit Plan  Continue shoulder stabilization and strengthening exercises with respect to protocol: No overhead lifting, no lifting 10# per MD. Standing ER with weight side lying ER with weight. Proproceptive exercises for ankle stability. Modalities PRN.    Consulted and Agree with Plan of Care  Patient       Patient will benefit from skilled therapeutic intervention in order to improve the following deficits and impairments:  Abnormal gait, Pain, Increased edema, Decreased activity tolerance, Decreased strength, Decreased balance, Decreased mobility, Difficulty walking, Impaired UE functional use, Decreased range of motion  Visit Diagnosis: Acute pain of right shoulder  Stiffness of right shoulder, not elsewhere classified  Pain in right ankle and joints of right foot  Gait  abnormality  Difficulty in walking, not elsewhere classified     Problem List Patient Active Problem List   Diagnosis Date Noted  . Diverticulitis of colon 05/03/2015    Gabriela Eves, PT, DPT  11/19/2017, 5:51 PM  [facility] [place], [place], [address] Phone: [phone]   Fax:  [phone]  Name: Aaron Krause MRN: [id number] Date of Birth: Nov 09, 1965

## 2017-11-21 ENCOUNTER — Encounter: Payer: Self-pay | Admitting: Physical Therapy

## 2017-11-21 ENCOUNTER — Ambulatory Visit: Payer: PRIVATE HEALTH INSURANCE | Admitting: Physical Therapy

## 2017-11-21 DIAGNOSIS — M25511 Pain in right shoulder: Secondary | ICD-10-CM | POA: Diagnosis not present

## 2017-11-21 DIAGNOSIS — R262 Difficulty in walking, not elsewhere classified: Secondary | ICD-10-CM

## 2017-11-21 DIAGNOSIS — M25571 Pain in right ankle and joints of right foot: Secondary | ICD-10-CM

## 2017-11-21 DIAGNOSIS — M25611 Stiffness of right shoulder, not elsewhere classified: Secondary | ICD-10-CM

## 2017-11-21 DIAGNOSIS — R269 Unspecified abnormalities of gait and mobility: Secondary | ICD-10-CM

## 2017-11-21 NOTE — Therapy (Signed)
[facility] [place], [place], [address] Phone: [phone]   Fax:  [phone]  Physical Therapy Treatment  Patient Details  Name: Aaron Krause MRN: [id number] Date of Birth: 03-Jan-1966 Referring Provider: Susa Day, MD   Encounter Date: 11/21/2017  PT End of Session - 11/21/17 1711    Visit Number  44    Number of Visits  19    Date for PT Re-Evaluation  11/29/17    Authorization Type  Pt has 9 visits approved from workers comp. Will need to reapply for further visits.     Authorization Time Period  1 month    Authorization - Visit Number  24    Authorization - Number of Visits  24    PT Start Time  5732    PT Stop Time  1737    PT Time Calculation (min)  47 min    Activity Tolerance  Patient tolerated treatment well    Behavior During Therapy  WFL for tasks assessed/performed       Past Medical History:  Diagnosis Date  . Arthritis   . Diverticulitis    with perforation reported  . Ruptured disk   . Trimalleolar fracture of right ankle     Past Surgical History:  Procedure Laterality Date  . ABDOMINAL SURGERY     diverticulitis perforation  . ORIF ANKLE FRACTURE Right 06/06/2017   Procedure: OPEN REDUCTION INTERNAL FIXATION (ORIF) ANKLE FRACTURE;  Surgeon: Wylene Simmer, MD;  Location: [facility];  Service: Orthopedics;  Laterality: Right;  . TOTAL HIP ARTHROPLASTY      There were no vitals filed for this visit.  Subjective Assessment - 11/21/17 1702    Subjective  Reports that his LUE has caused more pain recently than the RUE. Denies any pain in R shoulder upon arrival. Usual fatigue and soreness in R ankle but walks a lot and walks a lot on uneven ground.    Pertinent History  s/p right trimalleolar fx s/p surgery on 06/06/17, s/p R rotator cuff repair on 07/15/17.     Patient Stated Goals  get back to work; improve strength    Currently in Pain?  Yes    Pain Score  2     Pain  Location  Ankle    Pain Orientation  Right    Pain Descriptors / Indicators  Sore    Pain Type  Surgical pain    Pain Onset  More than a month ago    Pain Frequency  Constant         [facility] PT Assessment - 11/21/17 0001      Assessment   Medical Diagnosis  Trimalleolar Fx/dislocation right ankle, R rotator cuffer replair    Hand Dominance  Right                   [facility] Adult PT Treatment/Exercise - 11/21/17 0001      Shoulder Exercises: Supine   Protraction  Strengthening;Right;20 reps;Weights    Protraction Weight (lbs)  2      Shoulder Exercises: Sidelying   External Rotation  Strengthening;Right;20 reps;Weights    External Rotation Weight (lbs)  2      Shoulder Exercises: Standing   External Rotation  Strengthening;Right;20 reps;Theraband    Theraband Level (Shoulder External Rotation)  Level 3 (Green)    Extension  Strengthening;Right;20 reps;Theraband    Theraband Level (Shoulder Extension)  Level 3 (Green)    Other Standing Exercises  RUE wall  clock from 3-6 x10 reps      Shoulder Exercises: ROM/Strengthening   UBE (Upper Arm Bike)  30 RPM x8 min (4' forward, 4' backward)      Shoulder Exercises: Stretch   Corner Stretch  3 reps;30 seconds    Cross Chest Stretch  3 reps;30 seconds      Shoulder Exercises: Body Blade   External Rotation  3 reps;15 seconds    External Rotation Limitations  large yellow body blade      Modalities   Modalities  Vasopneumatic      Vasopneumatic   Number Minutes Vasopneumatic   15 minutes    Vasopnuematic Location   Shoulder    Vasopneumatic Pressure  Low    Vasopneumatic Temperature   34                  PT Long Term Goals - 10/04/17 0830      PT LONG TERM GOAL #1   Title  Pt will be independent with a HEP and progression.     Time  8    Period  Weeks    Status  Achieved      PT LONG TERM GOAL #2   Title  Pt will improve his R SLS to >/= 30 seconds to improve balance and functional mobility.      Baseline  currently only able to SLS for about 10-15 seconds    Time  8    Period  Weeks    Status  On-going      PT LONG TERM GOAL #3   Title  Walk with normal gait pattern with no pain reported.     Baseline  10/04/17: walking with no pain however still noted with shortened step length and decreased DF/DF     Time  8    Period  Weeks    Status  On-going      PT LONG TERM GOAL #4   Title  Pt will improve his R shoulder flexion to >/= 150 degrees.     Time  8    Period  Weeks    Status  Achieved      PT LONG TERM GOAL #5   Title  Pt will improve his R grip strength to >/= 40 ppsi.     Time  8    Period  Weeks    Status  Achieved            Plan - 11/21/17 1736    Clinical Impression Statement  Patient able to tolerate treatment fairly well although he arrives to treatment already fatigued from working. Patient continues to experience "overall fatigue" with R ankle. Patient instructed to complete R shoulder protraction in neutral hand position to avoid a minimal soreness experienced under R acromian. Patient fatigued quickly with supine and SL strengthening. Possibly more R forward shoulder noted with abduction with wall clocks and consulted with Gabriela Eves, DPT to address during morning treatment to see if fatigue is the culprit.    Rehab Potential  Good    Clinical Impairments Affecting Rehab Potential  surgery 07/15/17 current 16 weeks 11/04/17    PT Frequency  3x / week    PT Duration  4 weeks    PT Treatment/Interventions  ADLs/Self Care Home Management;Cryotherapy;Electrical Stimulation;Therapeutic exercise;Therapeutic activities;Functional mobility training;Stair training;Gait training;Ultrasound;Balance training;Neuromuscular re-education;Patient/family education;Manual techniques;Passive range of motion;Taping    PT Next Visit Plan  Continue shoulder stabilization and strengthening exercises with respect to protocol: No overhead lifting,  no lifting 10# per MD.  Standing ER with weight side lying ER with weight. Proproceptive exercises for ankle stability. Modalities PRN.    PT Home Exercise Plan  serratus punches    Consulted and Agree with Plan of Care  Patient       Patient will benefit from skilled therapeutic intervention in order to improve the following deficits and impairments:  Abnormal gait, Pain, Increased edema, Decreased activity tolerance, Decreased strength, Decreased balance, Decreased mobility, Difficulty walking, Impaired UE functional use, Decreased range of motion  Visit Diagnosis: Acute pain of right shoulder  Stiffness of right shoulder, not elsewhere classified  Pain in right ankle and joints of right foot  Gait abnormality  Difficulty in walking, not elsewhere classified     Problem List Patient Active Problem List   Diagnosis Date Noted  . Diverticulitis of colon 05/03/2015    Standley Brooking PTA 11/21/2017, 5:59 PM  [facility] [place], [place], [address] Phone: [phone]   Fax:  [phone]  Name: Aaron Krause MRN: [id number] Date of Birth: Jul 28, 1966

## 2017-11-22 ENCOUNTER — Ambulatory Visit: Payer: PRIVATE HEALTH INSURANCE | Admitting: Physical Therapy

## 2017-11-22 DIAGNOSIS — M25651 Stiffness of right hip, not elsewhere classified: Secondary | ICD-10-CM

## 2017-11-22 DIAGNOSIS — M25611 Stiffness of right shoulder, not elsewhere classified: Secondary | ICD-10-CM

## 2017-11-22 DIAGNOSIS — M25511 Pain in right shoulder: Secondary | ICD-10-CM | POA: Diagnosis not present

## 2017-11-22 DIAGNOSIS — R262 Difficulty in walking, not elsewhere classified: Secondary | ICD-10-CM

## 2017-11-22 DIAGNOSIS — R269 Unspecified abnormalities of gait and mobility: Secondary | ICD-10-CM

## 2017-11-22 DIAGNOSIS — M25571 Pain in right ankle and joints of right foot: Secondary | ICD-10-CM

## 2017-11-22 NOTE — Therapy (Signed)
[facility] [place], [place], [address] Phone: [phone]   Fax:  [phone]  Physical Therapy Treatment  Patient Details  Name: Aaron Krause MRN: [id number] Date of Birth: 08-07-1965 Referring Provider: Susa Day, MD   Encounter Date: 11/22/2017  PT End of Session - 11/22/17 0734    Visit Number  45    Number of Visits  47    Date for PT Re-Evaluation  11/29/17    Authorization Type  Pt has 9 visits approved from workers comp. Will need to reapply for further visits.     PT Start Time  0730    PT Stop Time  0806 requested to leave early due to work    PT Time Calculation (min)  36 min    Activity Tolerance  Patient tolerated treatment well    Behavior During Therapy  [facility] for tasks assessed/performed       Past Medical History:  Diagnosis Date  . Arthritis   . Diverticulitis    with perforation reported  . Ruptured disk   . Trimalleolar fracture of right ankle     Past Surgical History:  Procedure Laterality Date  . ABDOMINAL SURGERY     diverticulitis perforation  . ORIF ANKLE FRACTURE Right 06/06/2017   Procedure: OPEN REDUCTION INTERNAL FIXATION (ORIF) ANKLE FRACTURE;  Surgeon: Wylene Simmer, MD;  Location: [facility];  Service: Orthopedics;  Laterality: Right;  . TOTAL HIP ARTHROPLASTY      There were no vitals filed for this visit.  Subjective Assessment - 11/22/17 0735    Subjective  Ankle is a little sore during rest. Patient reports once he begins to walk, pain goes away.    Pertinent History  s/p right trimalleolar fx s/p surgery on 06/06/17, s/p R rotator cuff repair on 07/15/17.                        [facility] Adult PT Treatment/Exercise - 11/22/17 0001      Shoulder Exercises: Supine   Protraction  Strengthening;Right;20 reps;Weights    Protraction Weight (lbs)  2      Shoulder Exercises: Sidelying   External Rotation  Strengthening;Right;20 reps;Weights    External Rotation Weight (lbs)  2      Shoulder Exercises: Standing   Flexion  Strengthening;20 reps    Flexion Limitations  in scaption plane, stop before shoulder elevation      Shoulder Exercises: ROM/Strengthening   UBE (Upper Arm Bike)  30 RPM x8 min (4' forward, 4' backward)      Manual Therapy   Manual Therapy  Passive ROM;Joint mobilization;Scapular mobilization    Joint Mobilization  Grade III-IV R shoulder posterior and inferior glides and distraction with oscillations    Scapular Mobilization  elevation, depression, upward and downward rotation to improve scapulohumeral rhythm    Passive ROM  PROM into right shoulder flexion and ER Grade III-IV Posterior joint mobs, inferior glides, and long axis distraction to improve ROM followed by manual rhythmic stabilization in sidelying to promote shoulder ER strength at proximal and distal perturbations 3x15 each      Ankle Exercises: Standing   Other Standing Ankle Exercises  standing posterior joint mobs with red TB on step x20                  PT Long Term Goals - 10/04/17 0830      PT LONG TERM GOAL #1   Title  Pt will  be independent with a HEP and progression.     Time  8    Period  Weeks    Status  Achieved      PT LONG TERM GOAL #2   Title  Pt will improve his R SLS to >/= 30 seconds to improve balance and functional mobility.     Baseline  currently only able to SLS for about 10-15 seconds    Time  8    Period  Weeks    Status  On-going      PT LONG TERM GOAL #3   Title  Walk with normal gait pattern with no pain reported.     Baseline  10/04/17: walking with no pain however still noted with shortened step length and decreased DF/DF     Time  8    Period  Weeks    Status  On-going      PT LONG TERM GOAL #4   Title  Pt will improve his R shoulder flexion to >/= 150 degrees.     Time  8    Period  Weeks    Status  Achieved      PT LONG TERM GOAL #5   Title  Pt will improve his R grip strength to >/=  40 ppsi.     Time  8    Period  Weeks    Status  Achieved            Plan - 11/22/17 0829    Clinical Impression Statement  Patient was able to tolerate treatment well. Patient was able to abduct shoulder with less shoulder elevation compensation at start of session however patient began to demonstrate shoulder compensation and forward shoulder positioning after exercise. Patient noted with good shoulder joint mobilization. Patient requested to opt out of vaso today secondary needing to leave early for work.    Clinical Presentation  Stable    Clinical Decision Making  Low    Rehab Potential  Good    Clinical Impairments Affecting Rehab Potential  surgery 07/15/17 current 16 weeks 11/04/17    PT Frequency  3x / week    PT Duration  4 weeks    PT Treatment/Interventions  ADLs/Self Care Home Management;Cryotherapy;Electrical Stimulation;Therapeutic exercise;Therapeutic activities;Functional mobility training;Stair training;Gait training;Ultrasound;Balance training;Neuromuscular re-education;Patient/family education;Manual techniques;Passive range of motion;Taping    PT Next Visit Plan  Continue shoulder stabilization and strengthening exercises with respect to protocol: No overhead lifting, no lifting 10# per MD. Standing ER with weight side lying ER with weight. Proproceptive exercises for ankle stability. Modalities PRN.    Consulted and Agree with Plan of Care  Patient       Patient will benefit from skilled therapeutic intervention in order to improve the following deficits and impairments:  Abnormal gait, Pain, Increased edema, Decreased activity tolerance, Decreased strength, Decreased balance, Decreased mobility, Difficulty walking, Impaired UE functional use, Decreased range of motion  Visit Diagnosis: Acute pain of right shoulder  Stiffness of right shoulder, not elsewhere classified  Pain in right ankle and joints of right foot  Gait abnormality  Difficulty in walking, not  elsewhere classified  Hip stiffness, right     Problem List Patient Active Problem List   Diagnosis Date Noted  . Diverticulitis of colon 05/03/2015    Gabriela Eves, PT, DPT 11/22/2017, 8:54 AM  [facility] [address] [place], [place], [address] Phone: [phone]   Fax:  [phone]  Name: Aaron Krause MRN: [id number] Date of Birth:  04/01/1966   

## 2017-11-26 ENCOUNTER — Ambulatory Visit: Payer: PRIVATE HEALTH INSURANCE | Admitting: Physical Therapy

## 2017-11-26 DIAGNOSIS — M25511 Pain in right shoulder: Secondary | ICD-10-CM | POA: Diagnosis not present

## 2017-11-26 DIAGNOSIS — M25611 Stiffness of right shoulder, not elsewhere classified: Secondary | ICD-10-CM

## 2017-11-26 DIAGNOSIS — R269 Unspecified abnormalities of gait and mobility: Secondary | ICD-10-CM

## 2017-11-26 DIAGNOSIS — R262 Difficulty in walking, not elsewhere classified: Secondary | ICD-10-CM

## 2017-11-26 DIAGNOSIS — M25571 Pain in right ankle and joints of right foot: Secondary | ICD-10-CM

## 2017-11-26 NOTE — Therapy (Signed)
[facility] [place], [place], [address] Phone: [phone]   Fax:  [phone]  Physical Therapy Treatment  Patient Details  Name: Aaron Krause MRN: [id number] Date of Birth: 20-May-1966 Referring Provider: Susa Day, MD   Encounter Date: 11/26/2017  PT End of Session - 11/26/17 1658    Visit Number  61    Number of Visits  5    Date for PT Re-Evaluation  11/29/17    Authorization Type  Pt has 9 visits approved from workers comp. Will need to reapply for further visits.     PT Start Time  1645    PT Stop Time  1735    PT Time Calculation (min)  50 min    Activity Tolerance  Patient tolerated treatment well    Behavior During Therapy  WFL for tasks assessed/performed       Past Medical History:  Diagnosis Date  . Arthritis   . Diverticulitis    with perforation reported  . Ruptured disk   . Trimalleolar fracture of right ankle     Past Surgical History:  Procedure Laterality Date  . ABDOMINAL SURGERY     diverticulitis perforation  . ORIF ANKLE FRACTURE Right 06/06/2017   Procedure: OPEN REDUCTION INTERNAL FIXATION (ORIF) ANKLE FRACTURE;  Surgeon: Wylene Simmer, MD;  Location: [place];  Service: Orthopedics;  Laterality: Right;  . TOTAL HIP ARTHROPLASTY      There were no vitals filed for this visit.  Subjective Assessment - 11/26/17 1727    Subjective  Patient reported feeling a little pain after work but the ankle feels like its making a lot of improvement.     Pertinent History  s/p right trimalleolar fx s/p surgery on 06/06/17, s/p R rotator cuff repair on 07/15/17.     Patient Stated Goals  get back to work; improve strength    Currently in Pain?  Yes    Pain Score  2     Pain Location  Ankle    Pain Descriptors / Indicators  Sore    Pain Type  Surgical pain    Pain Onset  More than a month ago         [facility] PT Assessment - 11/26/17 0001      Assessment   Medical Diagnosis   Trimalleolar Fx/dislocation right ankle, R rotator cuffer replair                   [facility] Adult PT Treatment/Exercise - 11/26/17 0001      Shoulder Exercises: Prone   Retraction  Strengthening;Right;20 reps;Weights    Retraction Weight (lbs)  5    Extension  Strengthening;Right;20 reps;Weights    Extension Weight (lbs)  5    Horizontal ABduction 1  AROM;Strengthening;Right;10 reps;Weights neutral palm    Horizontal ABduction 1 Weight (lbs)  5    Other Prone Exercises  prone scapular squeezes, arms on head x20       Shoulder Exercises: Sidelying   External Rotation  Strengthening;Right;20 reps;Weights    External Rotation Weight (lbs)  2 and without weight      Shoulder Exercises: Standing   Other Standing Exercises  bilateral IR/ER drivers with 5# weight x20      Shoulder Exercises: ROM/Strengthening   UBE (Upper Arm Bike)  30 RPM x8 min (4' forward, 4' backward)      Modalities   Modalities  Vasopneumatic      Vasopneumatic   Number Minutes Vasopneumatic  10 minutes    Vasopnuematic Location   Shoulder    Vasopneumatic Pressure  Low    Vasopneumatic Temperature   34      Ankle Exercises: Standing   Heel Raises  20 reps off step                  PT Long Term Goals - 10/04/17 0830      PT LONG TERM GOAL #1   Title  Pt will be independent with a HEP and progression.     Time  8    Period  Weeks    Status  Achieved      PT LONG TERM GOAL #2   Title  Pt will improve his R SLS to >/= 30 seconds to improve balance and functional mobility.     Baseline  currently only able to SLS for about 10-15 seconds    Time  8    Period  Weeks    Status  On-going      PT LONG TERM GOAL #3   Title  Walk with normal gait pattern with no pain reported.     Baseline  10/04/17: walking with no pain however still noted with shortened step length and decreased DF/DF     Time  8    Period  Weeks    Status  On-going      PT LONG TERM GOAL #4   Title  Pt will  improve his R shoulder flexion to >/= 150 degrees.     Time  8    Period  Weeks    Status  Achieved      PT LONG TERM GOAL #5   Title  Pt will improve his R grip strength to >/= 40 ppsi.     Time  8    Period  Weeks    Status  Achieved            Plan - 11/26/17 1733    Clinical Impression Statement  Patient was able to tolerate treatment well despite reports of muscle fatigue. Patient reported with muscle fatigue with prone scapular squeezes. Patient noted with trunk rotation compensation during last few reps. Patient instructed to stop to rest and stop to prevent compensation from other muscles. Patient reported understanding. PT and patient discussed holding ER exercises as patient has not made any gains with motion or strength. Muscle may be overly fatigued and may need time to rest. Patient reported agreement. Normal response to modalities upon removal.    Clinical Presentation  Stable    Clinical Decision Making  Low    Rehab Potential  Good    Clinical Impairments Affecting Rehab Potential  surgery 07/15/17 current 16 weeks 11/04/17    PT Frequency  3x / week    PT Duration  4 weeks    PT Treatment/Interventions  ADLs/Self Care Home Management;Cryotherapy;Electrical Stimulation;Therapeutic exercise;Therapeutic activities;Functional mobility training;Stair training;Gait training;Ultrasound;Balance training;Neuromuscular re-education;Patient/family education;Manual techniques;Passive range of motion;Taping    PT Next Visit Plan  Continue shoulder stabilization and strengthening exercises with respect to protocol: No overhead lifting, no lifting 10# per MD. focus on middle and lower trap strengthening. Proproceptive exercises for ankle stability. Modalities PRN.    Consulted and Agree with Plan of Care  Patient       Patient will benefit from skilled therapeutic intervention in order to improve the following deficits and impairments:  Abnormal gait, Pain, Increased edema, Decreased  activity tolerance, Decreased strength, Decreased balance, Decreased mobility, Difficulty  walking, Impaired UE functional use, Decreased range of motion  Visit Diagnosis: Acute pain of right shoulder  Stiffness of right shoulder, not elsewhere classified  Pain in right ankle and joints of right foot  Difficulty in walking, not elsewhere classified  Gait abnormality     Problem List Patient Active Problem List   Diagnosis Date Noted  . Diverticulitis of colon 05/03/2015   Gabriela Eves, PT, DPT 11/26/2017, 5:54 PM  [facility] [address] [place], [place], [address] Phone: [phone]   Fax:  [phone]  Name: Aaron Krause MRN: [id number] Date of Birth: [birth year]

## 2017-11-28 ENCOUNTER — Ambulatory Visit: Payer: PRIVATE HEALTH INSURANCE | Attending: Specialist | Admitting: Physical Therapy

## 2017-11-28 DIAGNOSIS — R269 Unspecified abnormalities of gait and mobility: Secondary | ICD-10-CM | POA: Diagnosis present

## 2017-11-28 DIAGNOSIS — M25511 Pain in right shoulder: Secondary | ICD-10-CM | POA: Diagnosis not present

## 2017-11-28 DIAGNOSIS — M25611 Stiffness of right shoulder, not elsewhere classified: Secondary | ICD-10-CM | POA: Diagnosis present

## 2017-11-28 DIAGNOSIS — R262 Difficulty in walking, not elsewhere classified: Secondary | ICD-10-CM | POA: Insufficient documentation

## 2017-11-28 DIAGNOSIS — M25571 Pain in right ankle and joints of right foot: Secondary | ICD-10-CM | POA: Diagnosis present

## 2017-11-28 NOTE — Therapy (Signed)
[facility] [place], [place], [address] Phone: [phone]   Fax:  [phone]  Physical Therapy Treatment  Patient Details  Name: Aaron Krause MRN: [id number] Date of Birth: February 02, 1966 Referring Provider: Susa Day, MD   Encounter Date: 11/28/2017  PT End of Session - 11/28/17 1758    Visit Number  48    Number of Visits  17    Date for PT Re-Evaluation  11/29/17    Authorization Type  Pt has 9 visits approved from workers comp. Will need to reapply for further visits.     Authorization Time Period  1 month    PT Start Time  1648    PT Stop Time  1737    PT Time Calculation (min)  49 min    Activity Tolerance  Patient tolerated treatment well    Behavior During Therapy  WFL for tasks assessed/performed       Past Medical History:  Diagnosis Date  . Arthritis   . Diverticulitis    with perforation reported  . Ruptured disk   . Trimalleolar fracture of right ankle     Past Surgical History:  Procedure Laterality Date  . ABDOMINAL SURGERY     diverticulitis perforation  . ORIF ANKLE FRACTURE Right 06/06/2017   Procedure: OPEN REDUCTION INTERNAL FIXATION (ORIF) ANKLE FRACTURE;  Surgeon: Wylene Simmer, MD;  Location: [place];  Service: Orthopedics;  Laterality: Right;  . TOTAL HIP ARTHROPLASTY      There were no vitals filed for this visit.  Subjective Assessment - 11/28/17 1757    Subjective  "I'm tired."    Pertinent History  s/p right trimalleolar fx s/p surgery on 06/06/17, s/p R rotator cuff repair on 07/15/17.     Patient Stated Goals  get back to work; improve strength    Currently in Pain?  Yes    Pain Score  2     Pain Location  Ankle    Pain Orientation  Right    Pain Descriptors / Indicators  Sore    Pain Type  Surgical pain    Pain Onset  More than a month ago         [facility] PT Assessment - 11/28/17 0001      AROM   Right Shoulder Flexion  70 Degrees    Right Shoulder  ABduction  82 Degrees    Right Shoulder External Rotation  60 Degrees    Right Ankle Dorsiflexion  7    Right Ankle Plantar Flexion  36    Right Ankle Inversion  25    Right Ankle Eversion  20 (+) pain  in lateral malleolus      Strength   Right/Left Shoulder  Right    Right Shoulder Flexion  3+/5 within available range    Right Shoulder ABduction  3+/5 within available range    Right Shoulder Internal Rotation  4-/5    Right Shoulder External Rotation  4+/5    Right Ankle Dorsiflexion  4+/5    Right Ankle Inversion  4/5    Right Ankle Eversion  4+/5                   [facility] Adult PT Treatment/Exercise - 11/28/17 0001      Shoulder Exercises: Standing   Other Standing Exercises  pec fly red TB x10    Other Standing Exercises  Scapular clocks (12, 2, 3, 5, 6) yellow TB x20  Shoulder Exercises: ROM/Strengthening   UBE (Upper Arm Bike)  30 RPM x8 min (4' forward, 4' backward)      Ankle Exercises: Standing   Rocker Board  3 minutes lateral    Heel Raises  20 reps;Both    Other Standing Ankle Exercises  standing bosu circles 1 minute followed by static balance x1 minute                  PT Long Term Goals - 11/28/17 1659      PT LONG TERM GOAL #1   Title  Pt will be independent with a HEP and progression.     Time  8    Period  Weeks    Status  Achieved      PT LONG TERM GOAL #2   Title  Pt will improve his R SLS to >/= 30 seconds to improve balance and functional mobility.     Time  8    Period  Weeks    Status  On-going      PT LONG TERM GOAL #3   Title  Walk with normal gait pattern with no pain reported.     Time  8    Period  Weeks    Status  On-going      PT LONG TERM GOAL #4   Title  Pt will improve his R shoulder flexion to >/= 150 degrees.     Time  8    Period  Weeks    Status  Achieved      PT LONG TERM GOAL #5   Title  Pt will improve his R grip strength to >/= 40 ppsi.     Time  8    Period  Weeks    Status  Achieved             Plan - 11/28/17 1732    Clinical Impression Statement  Patient was able to tolerate treatment despite reports of lateral ankle pain with inversion. Patient still demonstrates impaired scapulo-humeral rhythm with shoulder elevation compensation; it is especially noted after working a full work day. Patient noted with improved right shoulder AROM. Patient opted out of vaso today.     Clinical Presentation  Stable    Clinical Decision Making  Low    Rehab Potential  Good    Clinical Impairments Affecting Rehab Potential  surgery 07/15/17 current 16 weeks 11/04/17    PT Frequency  3x / week    PT Duration  4 weeks    PT Treatment/Interventions  ADLs/Self Care Home Management;Cryotherapy;Electrical Stimulation;Therapeutic exercise;Therapeutic activities;Functional mobility training;Stair training;Gait training;Ultrasound;Balance training;Neuromuscular re-education;Patient/family education;Manual techniques;Passive range of motion;Taping    PT Next Visit Plan  Continue right shoulder strengthening  and right ankle strengthening pending MD & Worker's comp approval.    Consulted and Agree with Plan of Care  Patient       Patient will benefit from skilled therapeutic intervention in order to improve the following deficits and impairments:  Abnormal gait, Pain, Increased edema, Decreased activity tolerance, Decreased strength, Decreased balance, Decreased mobility, Difficulty walking, Impaired UE functional use, Decreased range of motion  Visit Diagnosis: Acute pain of right shoulder  Stiffness of right shoulder, not elsewhere classified  Pain in right ankle and joints of right foot  Difficulty in walking, not elsewhere classified  Gait abnormality     Problem List Patient Active Problem List   Diagnosis Date Noted  . Diverticulitis of colon 05/03/2015    Gabriela Eves, PT,  DPT 11/28/2017, 6:02 PM  [facility] [place], [place], [address] Phone: [phone]   Fax:  [phone]  Name: Aaron Krause MRN: [id number] Date of Birth: 11-11-1965

## 2017-11-29 ENCOUNTER — Encounter: Payer: Self-pay | Admitting: Physical Therapy

## 2017-12-05 ENCOUNTER — Ambulatory Visit: Payer: PRIVATE HEALTH INSURANCE | Admitting: *Deleted

## 2017-12-05 DIAGNOSIS — M25611 Stiffness of right shoulder, not elsewhere classified: Secondary | ICD-10-CM

## 2017-12-05 DIAGNOSIS — M25511 Pain in right shoulder: Secondary | ICD-10-CM | POA: Diagnosis not present

## 2017-12-05 NOTE — Therapy (Signed)
[facility] [place], [place], [address] Phone: [phone]   Fax:  [phone]  Physical Therapy Treatment  Patient Details  Name: Aaron Krause MRN: [id number] Date of Birth: 1966/05/27 Referring Provider: Susa Day, MD   Encounter Date: 12/05/2017    Past Medical History:  Diagnosis Date  . Arthritis   . Diverticulitis    with perforation reported  . Ruptured disk   . Trimalleolar fracture of right ankle     Past Surgical History:  Procedure Laterality Date  . ABDOMINAL SURGERY     diverticulitis perforation  . ORIF ANKLE FRACTURE Right 06/06/2017   Procedure: OPEN REDUCTION INTERNAL FIXATION (ORIF) ANKLE FRACTURE;  Surgeon: Wylene Simmer, MD;  Location: [place];  Service: Orthopedics;  Laterality: Right;  . TOTAL HIP ARTHROPLASTY      There were no vitals filed for this visit.                    [facility] Adult PT Treatment/Exercise - 12/05/17 0001      Elbow Exercises   Elbow Flexion  Strengthening;20 reps 5#      Shoulder Exercises: Prone   Retraction  Strengthening;Right;Weights 3x15-20    Retraction Weight (lbs)  5    Extension  Strengthening;Right;Weights 3x15-20    Extension Weight (lbs)  5      Shoulder Exercises: Sidelying   External Rotation  Right;AROM;10 reps    Flexion  AROM;Right;10 reps to 90 degrees only      Shoulder Exercises: Standing   External Rotation  Strengthening;Theraband 3x10-15    Theraband Level (Shoulder External Rotation)  Level 2 (Red)    Other Standing Exercises  wall pushups 3x20      Shoulder Exercises: ROM/Strengthening   UBE (Upper Arm Bike)  30 RPM x10 min (5' forward, 5' backward)      Modalities   Modalities  Vasopneumatic                  PT Long Term Goals - 11/28/17 1659      PT LONG TERM GOAL #1   Title  Pt will be independent with a HEP and progression.     Time  8    Period  Weeks    Status  Achieved       PT LONG TERM GOAL #2   Title  Pt will improve his R SLS to >/= 30 seconds to improve balance and functional mobility.     Time  8    Period  Weeks    Status  On-going      PT LONG TERM GOAL #3   Title  Walk with normal gait pattern with no pain reported.     Time  8    Period  Weeks    Status  On-going      PT LONG TERM GOAL #4   Title  Pt will improve his R shoulder flexion to >/= 150 degrees.     Time  8    Period  Weeks    Status  Achieved      PT LONG TERM GOAL #5   Title  Pt will improve his R grip strength to >/= 40 ppsi.     Time  8    Period  Weeks    Status  Achieved              Patient will benefit from skilled therapeutic intervention in order to improve the following  deficits and impairments:     Visit Diagnosis: Acute pain of right shoulder  Stiffness of right shoulder, not elsewhere classified     Problem List Patient Active Problem List   Diagnosis Date Noted  . Diverticulitis of colon 05/03/2015    Jazir Newey,CHRIS, PTA 12/05/2017, 5:57 PM  [facility] [address] [place], [place], [address] Phone: [phone]   Fax:  [phone]  Name: Clemens Lachman MRN: [id number] Date of Birth: 06-Apr-1966

## 2017-12-06 ENCOUNTER — Ambulatory Visit: Payer: PRIVATE HEALTH INSURANCE | Admitting: Physical Therapy

## 2017-12-06 DIAGNOSIS — M25571 Pain in right ankle and joints of right foot: Secondary | ICD-10-CM

## 2017-12-06 DIAGNOSIS — M25511 Pain in right shoulder: Secondary | ICD-10-CM

## 2017-12-06 DIAGNOSIS — M25611 Stiffness of right shoulder, not elsewhere classified: Secondary | ICD-10-CM

## 2017-12-06 DIAGNOSIS — R262 Difficulty in walking, not elsewhere classified: Secondary | ICD-10-CM

## 2017-12-06 NOTE — Therapy (Signed)
[facility] [place], [place], [address] Phone: [phone]   Fax:  [phone]  Physical Therapy Treatment  Patient Details  Name: Aaron Krause MRN: [id number] Date of Birth: July 06, 1966 Referring Provider: Susa Day, MD   Encounter Date: 12/06/2017  PT End of Session - 12/06/17 0740    Visit Number  50    Number of Visits  61    Date for PT Re-Evaluation  12/30/17    PT Start Time  0730    PT Stop Time  0813    PT Time Calculation (min)  43 min    Activity Tolerance  Patient tolerated treatment well    Behavior During Therapy  [facility] for tasks assessed/performed       Past Medical History:  Diagnosis Date  . Arthritis   . Diverticulitis    with perforation reported  . Ruptured disk   . Trimalleolar fracture of right ankle     Past Surgical History:  Procedure Laterality Date  . ABDOMINAL SURGERY     diverticulitis perforation  . ORIF ANKLE FRACTURE Right 06/06/2017   Procedure: OPEN REDUCTION INTERNAL FIXATION (ORIF) ANKLE FRACTURE;  Surgeon: Wylene Simmer, MD;  Location: [place];  Service: Orthopedics;  Laterality: Right;  . TOTAL HIP ARTHROPLASTY      There were no vitals filed for this visit.  Subjective Assessment - 12/06/17 0738    Subjective  Patient reported feeling good. With no new complaints. Patient reported his ankle doctor released him and he would like to focus on the shoulder in therapy.    Pertinent History  s/p right trimalleolar fx s/p surgery on 06/06/17, s/p R rotator cuff repair on 07/15/17.     Patient Stated Goals  get back to work; improve strength    Currently in Pain?  No/denies         [facility] PT Assessment - 12/06/17 0001      Assessment   Medical Diagnosis  Trimalleolar Fx/dislocation right ankle, R rotator cuffer replair                   [facility] Adult PT Treatment/Exercise - 12/06/17 0001      Elbow Exercises   Elbow Flexion  Strengthening;20  reps;Seated 5#      Shoulder Exercises: Prone   Retraction  Strengthening;Right;Weights;10 reps 3x10    Retraction Weight (lbs)  5    Extension  Strengthening;Right;Weights;10 reps 3x10    Extension Weight (lbs)  5      Shoulder Exercises: Sidelying   External Rotation  Right;AROM;10 reps    Flexion  AROM;Right;10 reps 90 degrees      Shoulder Exercises: Standing   Other Standing Exercises  scapular clocks (12, 1, 3, 5, 6) 3x10    Other Standing Exercises  cones to bottom and middle shelf 2 x25 cones       Shoulder Exercises: ROM/Strengthening   UBE (Upper Arm Bike)  30 RPM x10 min (5' forward, 5' backward)    Wall Pushups  20 reps    Other ROM/Strengthening Exercises  clothes pin reach from top shelf for shoulder flexion    Other ROM/Strengthening Exercises  lat pull downs pink XTS                  PT Long Term Goals - 11/28/17 1659      PT LONG TERM GOAL #1   Title  Pt will be independent with a HEP and progression.  Time  8    Period  Weeks    Status  Achieved      PT LONG TERM GOAL #2   Title  Pt will improve his R SLS to >/= 30 seconds to improve balance and functional mobility.     Time  8    Period  Weeks    Status  On-going      PT LONG TERM GOAL #3   Title  Walk with normal gait pattern with no pain reported.     Time  8    Period  Weeks    Status  On-going      PT LONG TERM GOAL #4   Title  Pt will improve his R shoulder flexion to >/= 150 degrees.     Time  8    Period  Weeks    Status  Achieved      PT LONG TERM GOAL #5   Title  Pt will improve his R grip strength to >/= 40 ppsi.     Time  8    Period  Weeks    Status  Achieved            Plan - 12/06/17 0758    Clinical Impression Statement  Patient was able to complete treatment well with rest breaks secondary to fatigue. Patient reported during the second set of the cone reaches, he felt his arm fatiguing especially reaching to middle shelf. Patient opted out of vaso today.     Clinical Presentation  Stable    Clinical Decision Making  Low    Rehab Potential  Good    Clinical Impairments Affecting Rehab Potential  surgery 07/15/17 current 16 weeks 11/04/17    PT Frequency  3x / week    PT Duration  4 weeks    PT Treatment/Interventions  ADLs/Self Care Home Management;Cryotherapy;Electrical Stimulation;Therapeutic exercise;Therapeutic activities;Functional mobility training;Stair training;Gait training;Ultrasound;Balance training;Neuromuscular re-education;Patient/family education;Manual techniques;Passive range of motion;Taping    PT Next Visit Plan  Continue right shoulder strengthening focus on shoulder flexion with respect to no >90 degrees    Consulted and Agree with Plan of Care  Patient       Patient will benefit from skilled therapeutic intervention in order to improve the following deficits and impairments:  Pain  Visit Diagnosis: Acute pain of right shoulder  Stiffness of right shoulder, not elsewhere classified  Pain in right ankle and joints of right foot  Difficulty in walking, not elsewhere classified     Problem List Patient Active Problem List   Diagnosis Date Noted  . Diverticulitis of colon 05/03/2015    Gabriela Eves, PT, DPT 12/06/2017, 8:26 AM  [facility] [address] [place], [place], [address] Phone: [phone]   Fax:  [phone]  Name: Aaron Krause MRN: [id number] Date of Birth: 07-15-1966

## 2017-12-10 ENCOUNTER — Ambulatory Visit: Payer: PRIVATE HEALTH INSURANCE | Admitting: Physical Therapy

## 2017-12-12 ENCOUNTER — Ambulatory Visit: Payer: PRIVATE HEALTH INSURANCE | Admitting: Physical Therapy

## 2017-12-12 DIAGNOSIS — M25511 Pain in right shoulder: Secondary | ICD-10-CM | POA: Diagnosis not present

## 2017-12-12 DIAGNOSIS — M25611 Stiffness of right shoulder, not elsewhere classified: Secondary | ICD-10-CM

## 2017-12-12 NOTE — Therapy (Signed)
[facility] [place], [place], [address] Phone: [phone]   Fax:  [phone]  Physical Therapy Treatment  Patient Details  Name: Aaron Krause MRN: [id number] Date of Birth: 03-16-1966 Referring Provider: Susa Day, MD   Encounter Date: 12/12/2017  PT End of Session - 12/12/17 1710    Visit Number  51    Number of Visits  61    Date for PT Re-Evaluation  12/30/17    PT Start Time  1645    PT Stop Time  1733    PT Time Calculation (min)  48 min    Activity Tolerance  Patient tolerated treatment well    Behavior During Therapy  [facility] for tasks assessed/performed       Past Medical History:  Diagnosis Date  . Arthritis   . Diverticulitis    with perforation reported  . Ruptured disk   . Trimalleolar fracture of right ankle     Past Surgical History:  Procedure Laterality Date  . ABDOMINAL SURGERY     diverticulitis perforation  . ORIF ANKLE FRACTURE Right 06/06/2017   Procedure: OPEN REDUCTION INTERNAL FIXATION (ORIF) ANKLE FRACTURE;  Surgeon: Wylene Simmer, MD;  Location: [place];  Service: Orthopedics;  Laterality: Right;  . TOTAL HIP ARTHROPLASTY      There were no vitals filed for this visit.      [facility] PT Assessment - 12/12/17 0001      Assessment   Medical Diagnosis  Trimalleolar Fx/dislocation right ankle, R rotator cuffer replair                   [facility] Adult PT Treatment/Exercise - 12/12/17 0001      Shoulder Exercises: Supine   Protraction  Strengthening;Right;Weights;20 reps    Protraction Weight (lbs)  2      Shoulder Exercises: Prone   Retraction  Strengthening;Right;10 reps;20 reps;Weights 2x15    Retraction Weight (lbs)  5    Extension  Strengthening;Right;10 reps;20 reps;Weights 2x15    Extension Weight (lbs)  5    Other Prone Exercises  mid rows shoulder at 45-90 abd 2x15      Shoulder Exercises: Sidelying   External Rotation  Right;AROM;20 reps     Flexion  AROM;Right;10 reps;20 reps      Shoulder Exercises: Standing   External Rotation  Strengthening;Theraband;20 reps;10 reps    Theraband Level (Shoulder External Rotation)  Level 1 (Yellow)    Other Standing Exercises  shoulder clocks (3, 4, 5, 6) x20    Other Standing Exercises  cones to bottom and middle shelf 2 x20 cones       Shoulder Exercises: Therapy Ball   Flexion  Right;20 reps with 6# ball on wall, 90 degrees only      Shoulder Exercises: ROM/Strengthening   UBE (Upper Arm Bike)  30 RPM x10 min (5' forward, 5' backward)                  PT Long Term Goals - 11/28/17 1659      PT LONG TERM GOAL #1   Title  Pt will be independent with a HEP and progression.     Time  8    Period  Weeks    Status  Achieved      PT LONG TERM GOAL #2   Title  Pt will improve his R SLS to >/= 30 seconds to improve balance and functional mobility.     Time  8  Period  Weeks    Status  On-going      PT LONG TERM GOAL #3   Title  Walk with normal gait pattern with no pain reported.     Time  8    Period  Weeks    Status  On-going      PT LONG TERM GOAL #4   Title  Pt will improve his R shoulder flexion to >/= 150 degrees.     Time  8    Period  Weeks    Status  Achieved      PT LONG TERM GOAL #5   Title  Pt will improve his R grip strength to >/= 40 ppsi.     Time  8    Period  Weeks    Status  Achieved            Plan - 12/12/17 1722    Clinical Impression Statement  Patient was able to complete treatment with reports of fatigue. Patient noted with shoulder elevation compensation but patient demonstrated good shoulder propriception as he was able to self correct. No modalities today as patient needs to return to work.    Clinical Presentation  Stable    Clinical Decision Making  Low    Rehab Potential  Good    Clinical Impairments Affecting Rehab Potential  surgery 07/15/17 current 16 weeks 11/04/17    PT Frequency  3x / week    PT Duration  4 weeks     PT Treatment/Interventions  ADLs/Self Care Home Management;Cryotherapy;Electrical Stimulation;Therapeutic exercise;Therapeutic activities;Functional mobility training;Stair training;Gait training;Ultrasound;Balance training;Neuromuscular re-education;Patient/family education;Manual techniques;Passive range of motion;Taping    PT Next Visit Plan  Continue right shoulder strengthening focus on shoulder flexion with respect to no >90 degrees    Consulted and Agree with Plan of Care  Patient       Patient will benefit from skilled therapeutic intervention in order to improve the following deficits and impairments:  Pain  Visit Diagnosis: Acute pain of right shoulder  Stiffness of right shoulder, not elsewhere classified     Problem List Patient Active Problem List   Diagnosis Date Noted  . Diverticulitis of colon 05/03/2015    Gabriela Eves, PT, DPT 12/12/2017, 5:42 PM  [facility] [address] [place], [place], [address] Phone: [phone]   Fax:  [phone]  Name: Aaron Krause MRN: [id number] Date of Birth: 02/22/1966

## 2017-12-13 ENCOUNTER — Ambulatory Visit: Payer: PRIVATE HEALTH INSURANCE | Admitting: Physical Therapy

## 2017-12-13 ENCOUNTER — Encounter: Payer: Self-pay | Admitting: Physical Therapy

## 2017-12-13 DIAGNOSIS — M25511 Pain in right shoulder: Secondary | ICD-10-CM

## 2017-12-13 DIAGNOSIS — M25611 Stiffness of right shoulder, not elsewhere classified: Secondary | ICD-10-CM

## 2017-12-13 NOTE — Therapy (Signed)
[facility] [place], [place], [address] Phone: [phone]   Fax:  [phone]  Physical Therapy Treatment  Patient Details  Name: Aaron Krause MRN: [id number] Date of Birth: 09-03-1965 Referring Provider: Susa Day, MD   Encounter Date: 12/13/2017  PT End of Session - 12/13/17 0736    Visit Number  31    Number of Visits  61    Date for PT Re-Evaluation  12/30/17    Authorization Type  Pt has 9 visits approved from workers comp. Will need to reapply for further visits.     Authorization Time Period  1 month    Authorization - Visit Number  24    Authorization - Number of Visits  24    PT Start Time  0732    PT Stop Time  0809 2 units secondary to no modalities    PT Time Calculation (min)  37 min    Activity Tolerance  Patient tolerated treatment well    Behavior During Therapy  [facility] for tasks assessed/performed       Past Medical History:  Diagnosis Date  . Arthritis   . Diverticulitis    with perforation reported  . Ruptured disk   . Trimalleolar fracture of right ankle     Past Surgical History:  Procedure Laterality Date  . ABDOMINAL SURGERY     diverticulitis perforation  . ORIF ANKLE FRACTURE Right 06/06/2017   Procedure: OPEN REDUCTION INTERNAL FIXATION (ORIF) ANKLE FRACTURE;  Surgeon: Wylene Simmer, MD;  Location: [place];  Service: Orthopedics;  Laterality: Right;  . TOTAL HIP ARTHROPLASTY      There were no vitals filed for this visit.  Subjective Assessment - 12/13/17 0734    Subjective  Reports feeling alright this morning.    Pertinent History  s/p right trimalleolar fx s/p surgery on 06/06/17, s/p R rotator cuff repair on 07/15/17.     Patient Stated Goals  get back to work; improve strength    Currently in Pain?  Yes    Pain Score  2     Pain Location  Shoulder    Pain Orientation  Right    Pain Descriptors / Indicators  Sore    Pain Type  Surgical pain    Pain Onset   More than a month ago         [facility] PT Assessment - 12/13/17 0001      Assessment   Medical Diagnosis  Trimalleolar Fx/dislocation right ankle, R rotator cuffer replair    Onset Date/Surgical Date  07/15/17    Hand Dominance  Right    Next MD Visit  01/02/2018 Dr. Tonita Cong- shoulder    Prior Therapy  yes in past for back      Precautions   Precautions  Shoulder    Precaution Comments  no overhead lifting or lifting greater than #10                   [facility] Adult PT Treatment/Exercise - 12/13/17 0001      Shoulder Exercises: Supine   Protraction  Strengthening;Right;Weights;Limitations    Protraction Weight (lbs)  3    Protraction Limitations  2x15 reps      Shoulder Exercises: Prone   Retraction  Strengthening;Right;Weights;Limitations    Retraction Weight (lbs)  5    Retraction Limitations  3x10 reps    Extension  Strengthening;Right;Weights;Limitations    Extension Weight (lbs)  5    Extension Limitations  3x10  reps    Horizontal ABduction 1  Strengthening;Right;Weights;Limitations    Horizontal ABduction 1 Weight (lbs)  5    Horizontal ABduction 1 Limitations  2x15 reps with elbow flexion      Shoulder Exercises: Standing   External Rotation  Strengthening;Theraband;20 reps;10 reps    Theraband Level (Shoulder External Rotation)  Level 1 (Yellow)    Other Standing Exercises  shoulder clocks (3, 4, 5, 6) x20 2# ball    Other Standing Exercises  cones to bottom and middle shelf 2 x20 cones       Shoulder Exercises: Therapy Ball   Flexion  Right;20 reps 2x20 reps to 90 deg 6# ball      Shoulder Exercises: ROM/Strengthening   UBE (Upper Arm Bike)  30 RPM x10 min (5' forward, 5' backward)    Wall Pushups  20 reps                  PT Long Term Goals - 11/28/17 1659      PT LONG TERM GOAL #1   Title  Pt will be independent with a HEP and progression.     Time  8    Period  Weeks    Status  Achieved      PT LONG TERM GOAL #2   Title  Pt will  improve his R SLS to >/= 30 seconds to improve balance and functional mobility.     Time  8    Period  Weeks    Status  On-going      PT LONG TERM GOAL #3   Title  Walk with normal gait pattern with no pain reported.     Time  8    Period  Weeks    Status  On-going      PT LONG TERM GOAL #4   Title  Pt will improve his R shoulder flexion to >/= 150 degrees.     Time  8    Period  Weeks    Status  Achieved      PT LONG TERM GOAL #5   Title  Pt will improve his R grip strength to >/= 40 ppsi.     Time  8    Period  Weeks    Status  Achieved            Plan - 12/13/17 0810    Clinical Impression Statement  Patient tolerated today's treatment fairly well as he arrived with R shoulder soreness. Patient able to be progressed with R shoulder strengthening. Minimal R shoulder elevation noted with antigravity exercises. Patient would intermittantly hold R shoulder during exercises. Patient denied modalities as to return to work.    Rehab Potential  Good    Clinical Impairments Affecting Rehab Potential  surgery 07/15/17 current 16 weeks 11/04/17    PT Frequency  3x / week    PT Duration  4 weeks    PT Treatment/Interventions  ADLs/Self Care Home Management;Cryotherapy;Electrical Stimulation;Therapeutic exercise;Therapeutic activities;Functional mobility training;Stair training;Gait training;Ultrasound;Balance training;Neuromuscular re-education;Patient/family education;Manual techniques;Passive range of motion;Taping    PT Next Visit Plan  Continue right shoulder strengthening focus on shoulder flexion with respect to no >90 degrees    PT Home Exercise Plan  serratus punches    Consulted and Agree with Plan of Care  Patient       Patient will benefit from skilled therapeutic intervention in order to improve the following deficits and impairments:  Pain  Visit Diagnosis: Acute pain of right shoulder  Stiffness of right shoulder, not elsewhere classified     Problem  List Patient Active Problem List   Diagnosis Date Noted  . Diverticulitis of colon 05/03/2015    Standley Brooking, PTA 12/13/2017, 8:13 AM  [facility] [address] [place], [place], [address] Phone: [phone]   Fax:  [phone]  Name: Aaron Krause MRN: [id number] Date of Birth: [birth year]

## 2017-12-17 ENCOUNTER — Ambulatory Visit: Payer: PRIVATE HEALTH INSURANCE | Admitting: Physical Therapy

## 2017-12-17 DIAGNOSIS — M25511 Pain in right shoulder: Secondary | ICD-10-CM | POA: Diagnosis not present

## 2017-12-17 DIAGNOSIS — M25611 Stiffness of right shoulder, not elsewhere classified: Secondary | ICD-10-CM

## 2017-12-17 NOTE — Therapy (Signed)
[facility] [place], [place], [address] Phone: [phone]   Fax:  [phone]  Physical Therapy Treatment  Patient Details  Name: Aaron Krause MRN: [id number] Date of Birth: 17-Dec-1965 Referring Provider: Susa Day, MD   Encounter Date: 12/17/2017  PT End of Session - 12/17/17 1658    Visit Number  35    Number of Visits  61    Date for PT Re-Evaluation  12/30/17    PT Start Time  1025    PT Stop Time  1732    PT Time Calculation (min)  43 min    Activity Tolerance  Patient tolerated treatment well    Behavior During Therapy  [facility] for tasks assessed/performed       Past Medical History:  Diagnosis Date  . Arthritis   . Diverticulitis    with perforation reported  . Ruptured disk   . Trimalleolar fracture of right ankle     Past Surgical History:  Procedure Laterality Date  . ABDOMINAL SURGERY     diverticulitis perforation  . ORIF ANKLE FRACTURE Right 06/06/2017   Procedure: OPEN REDUCTION INTERNAL FIXATION (ORIF) ANKLE FRACTURE;  Surgeon: Wylene Simmer, MD;  Location: [place];  Service: Orthopedics;  Laterality: Right;  . TOTAL HIP ARTHROPLASTY      There were no vitals filed for this visit.  Subjective Assessment - 12/17/17 1657    Subjective  Patient reports feeling sore today.    Pertinent History  s/p right trimalleolar fx s/p surgery on 06/06/17, s/p R rotator cuff repair on 07/15/17.     Patient Stated Goals  get back to work; improve strength    Currently in Pain?  No/denies    Pain Orientation  Right    Pain Descriptors / Indicators  Sore    Pain Type  Surgical pain    Pain Onset  More than a month ago    Pain Frequency  Constant         [facility] PT Assessment - 12/17/17 0001      Assessment   Medical Diagnosis  Trimalleolar Fx/dislocation right ankle, R rotator cuffer replair    Onset Date/Surgical Date  07/15/17    Hand Dominance  Right    Next MD Visit  01/02/2018    Prior Therapy  yes in past for back                   [facility] Adult PT Treatment/Exercise - 12/17/17 0001      Shoulder Exercises: Prone   Other Prone Exercises  prone scapular squeezes with hands on head x30      Shoulder Exercises: Sidelying   External Rotation  Right;AROM;20 reps    Flexion  AROM;Right;10 reps;20 reps      Shoulder Exercises: Standing   Extension  Strengthening;20 reps;10 reps;Limitations    Extension Limitations  Pink XTS    Row  Strengthening;Both;10 reps;Theraband;Limitations    Row Limitations  Pink XTS      Shoulder Exercises: ROM/Strengthening   UBE (Upper Arm Bike)  30 RPM x10 min (5' forward, 5' backward)    Wall Pushups  20 reps      Vasopneumatic   Number Minutes Vasopneumatic   15 minutes    Vasopnuematic Location   Shoulder    Vasopneumatic Pressure  Low    Vasopneumatic Temperature   34                  PT Long Term  Goals - 11/28/17 1659      PT LONG TERM GOAL #1   Title  Pt will be independent with a HEP and progression.     Time  8    Period  Weeks    Status  Achieved      PT LONG TERM GOAL #2   Title  Pt will improve his R SLS to >/= 30 seconds to improve balance and functional mobility.     Time  8    Period  Weeks    Status  On-going      PT LONG TERM GOAL #3   Title  Walk with normal gait pattern with no pain reported.     Time  8    Period  Weeks    Status  On-going      PT LONG TERM GOAL #4   Title  Pt will improve his R shoulder flexion to >/= 150 degrees.     Time  8    Period  Weeks    Status  Achieved      PT LONG TERM GOAL #5   Title  Pt will improve his R grip strength to >/= 40 ppsi.     Time  8    Period  Weeks    Status  Achieved            Plan - 12/17/17 1734    Clinical Impression Statement  Patient was able to tolerate treatment well with minimal shoulder elevation compensation with flexion exercises. Patient educated the importance of allowing rest time for adequate  healing. Patient reported understanding. Normal response to modalities upon end of session.    Clinical Presentation  Stable    Clinical Decision Making  Low    Rehab Potential  Good    Clinical Impairments Affecting Rehab Potential  surgery 07/15/17 current 16 weeks 11/04/17    PT Frequency  3x / week    PT Duration  4 weeks    PT Treatment/Interventions  ADLs/Self Care Home Management;Cryotherapy;Electrical Stimulation;Therapeutic exercise;Therapeutic activities;Functional mobility training;Stair training;Gait training;Ultrasound;Balance training;Neuromuscular re-education;Patient/family education;Manual techniques;Passive range of motion;Taping    PT Next Visit Plan  Continue right shoulder strengthening focus on shoulder flexion with respect to no >90 degrees    Consulted and Agree with Plan of Care  Patient       Patient will benefit from skilled therapeutic intervention in order to improve the following deficits and impairments:  Pain  Visit Diagnosis: Acute pain of right shoulder  Stiffness of right shoulder, not elsewhere classified     Problem List Patient Active Problem List   Diagnosis Date Noted  . Diverticulitis of colon 05/03/2015    Gabriela Eves, PT, DPT 12/17/2017, 5:38 PM  [facility] [address] [place], [place], [address] Phone: [phone]   Fax:  [phone]  Name: Aaron Krause MRN: [id number] Date of Birth: [birth year]

## 2017-12-19 ENCOUNTER — Encounter: Payer: Self-pay | Admitting: Physical Therapy

## 2017-12-20 ENCOUNTER — Ambulatory Visit: Payer: PRIVATE HEALTH INSURANCE | Admitting: Physical Therapy

## 2017-12-20 DIAGNOSIS — M25511 Pain in right shoulder: Secondary | ICD-10-CM

## 2017-12-20 DIAGNOSIS — M25611 Stiffness of right shoulder, not elsewhere classified: Secondary | ICD-10-CM

## 2017-12-20 NOTE — Therapy (Signed)
[facility] [place], [place], [address] Phone: [phone]   Fax:  [phone]  Physical Therapy Treatment  Patient Details  Name: Aaron Krause MRN: [id number] Date of Birth: 1965/12/12 Referring Provider: Susa Day, MD   Encounter Date: 12/20/2017  PT End of Session - 12/20/17 0748    Visit Number  12    Number of Visits  61    Date for PT Re-Evaluation  12/30/17    PT Start Time  0730    PT Stop Time  0816    PT Time Calculation (min)  46 min    Activity Tolerance  Patient tolerated treatment well    Behavior During Therapy  [facility] for tasks assessed/performed       Past Medical History:  Diagnosis Date  . Arthritis   . Diverticulitis    with perforation reported  . Ruptured disk   . Trimalleolar fracture of right ankle     Past Surgical History:  Procedure Laterality Date  . ABDOMINAL SURGERY     diverticulitis perforation  . ORIF ANKLE FRACTURE Right 06/06/2017   Procedure: OPEN REDUCTION INTERNAL FIXATION (ORIF) ANKLE FRACTURE;  Surgeon: Wylene Simmer, MD;  Location: [place];  Service: Orthopedics;  Laterality: Right;  . TOTAL HIP ARTHROPLASTY      There were no vitals filed for this visit.  Subjective Assessment - 12/20/17 0748    Subjective  Patient reported feeling alright this morning    Pertinent History  s/p right trimalleolar fx s/p surgery on 06/06/17, s/p R rotator cuff repair on 07/15/17.     Patient Stated Goals  get back to work; improve strength    Currently in Pain?  No/denies         [facility] PT Assessment - 12/20/17 0001      Assessment   Medical Diagnosis  Trimalleolar Fx/dislocation right ankle, R rotator cuffer replair    Onset Date/Surgical Date  07/15/17    Hand Dominance  Right    Next MD Visit  01/02/2018    Prior Therapy  yes in past for back                   [facility] Adult PT Treatment/Exercise - 12/20/17 0001      Shoulder Exercises: Prone   Retraction  Strengthening;Right;Weights;Limitations    Retraction Weight (lbs)  5    Retraction Limitations  3x10 reps    Extension  Strengthening;Right;Weights;Limitations    Extension Weight (lbs)  5    Extension Limitations  3x10 reps    Horizontal ABduction 1  Strengthening;Right;Weights;Limitations    Horizontal ABduction 1 Weight (lbs)  5      Shoulder Exercises: Standing   External Rotation  Strengthening;10 reps;20 reps;Theraband    Theraband Level (Shoulder External Rotation)  Level 2 (Red)    Extension  Strengthening;20 reps;10 reps;Limitations    Extension Limitations  Pink XTS    Row  Strengthening;Both;10 reps;20 reps;Limitations    Row Limitations  Pink XTS    Other Standing Exercises  Mid rows Pink XTS 3x10    Other Standing Exercises  cones to bottom and middle shelf 2 x20 cones       Shoulder Exercises: ROM/Strengthening   UBE (Upper Arm Bike)  30 RPM x10 min (5' forward, 5' backward)    Wall Pushups  20 reps                  PT Long Term Goals - 11/28/17 1659  PT LONG TERM GOAL #1   Title  Pt will be independent with a HEP and progression.     Time  8    Period  Weeks    Status  Achieved      PT LONG TERM GOAL #2   Title  Pt will improve his R SLS to >/= 30 seconds to improve balance and functional mobility.     Time  8    Period  Weeks    Status  On-going      PT LONG TERM GOAL #3   Title  Walk with normal gait pattern with no pain reported.     Time  8    Period  Weeks    Status  On-going      PT LONG TERM GOAL #4   Title  Pt will improve his R shoulder flexion to >/= 150 degrees.     Time  8    Period  Weeks    Status  Achieved      PT LONG TERM GOAL #5   Title  Pt will improve his R grip strength to >/= 40 ppsi.     Time  8    Period  Weeks    Status  Achieved            Plan - 12/20/17 0819    Clinical Impression Statement  Patient was able to tolerate treatment well. Patient noted with improved shoulder flexion  endurance as noted by minimal shoulder elevation throughout cone lift to shelves. Patient opted out of vaso this session.    Clinical Presentation  Stable    Clinical Decision Making  Low    Rehab Potential  Good    Clinical Impairments Affecting Rehab Potential  surgery 07/15/17 current 16 weeks 11/04/17    PT Frequency  3x / week    PT Duration  4 weeks    PT Treatment/Interventions  ADLs/Self Care Home Management;Cryotherapy;Electrical Stimulation;Therapeutic exercise;Therapeutic activities;Functional mobility training;Stair training;Gait training;Ultrasound;Balance training;Neuromuscular re-education;Patient/family education;Manual techniques;Passive range of motion;Taping    PT Next Visit Plan  Continue right shoulder strengthening focus on shoulder flexion with respect to no >90 degrees    Consulted and Agree with Plan of Care  Patient       Patient will benefit from skilled therapeutic intervention in order to improve the following deficits and impairments:  Pain  Visit Diagnosis: Acute pain of right shoulder  Stiffness of right shoulder, not elsewhere classified     Problem List Patient Active Problem List   Diagnosis Date Noted  . Diverticulitis of colon 05/03/2015    Gabriela Eves, PT, DPT 12/20/2017, 8:29 AM  [facility] [address] [place], [place], [address] Phone: [phone]   Fax:  [phone]  Name: Trevon Strothers MRN: [id number] Date of Birth: [birth year]

## 2017-12-24 ENCOUNTER — Ambulatory Visit: Payer: PRIVATE HEALTH INSURANCE | Admitting: Physical Therapy

## 2017-12-24 DIAGNOSIS — M25511 Pain in right shoulder: Secondary | ICD-10-CM | POA: Diagnosis not present

## 2017-12-24 DIAGNOSIS — M25611 Stiffness of right shoulder, not elsewhere classified: Secondary | ICD-10-CM

## 2017-12-24 NOTE — Therapy (Addendum)
[facility] [place], [place], [address] Phone: [phone]   Fax:  [phone]  Physical Therapy Treatment  Patient Details  Name: Aaron Krause MRN: [id number] Date of Birth: [birth year] Referring Provider: Susa Day, MD   Encounter Date: 12/24/2017  PT End of Session - 12/24/17 1737    Visit Number  55    Number of Visits  61    Date for PT Re-Evaluation  12/30/17    PT Start Time  1647    PT Stop Time  1737    PT Time Calculation (min)  50 min    Activity Tolerance  Patient tolerated treatment well    Behavior During Therapy  [facility] for tasks assessed/performed       Past Medical History:  Diagnosis Date  . Arthritis   . Diverticulitis    with perforation reported  . Ruptured disk   . Trimalleolar fracture of right ankle     Past Surgical History:  Procedure Laterality Date  . ABDOMINAL SURGERY     diverticulitis perforation  . ORIF ANKLE FRACTURE Right 06/06/2017   Procedure: OPEN REDUCTION INTERNAL FIXATION (ORIF) ANKLE FRACTURE;  Surgeon: Wylene Simmer, MD;  Location: [place];  Service: Orthopedics;  Laterality: Right;  . TOTAL HIP ARTHROPLASTY      There were no vitals filed for this visit.  Subjective Assessment - 12/24/17 1739    Subjective  Patient reported feeling sore today.    Pertinent History  s/p right trimalleolar fx s/p surgery on 06/06/17, s/p R rotator cuff repair on 07/15/17.     Patient Stated Goals  get back to work; improve strength    Currently in Pain?  No/denies         [facility] PT Assessment - 12/24/17 0741      Assessment   Medical Diagnosis  Trimalleolar Fx/dislocation right ankle, R rotator cuffer replair    Onset Date/Surgical Date  07/15/17    Hand Dominance  Right    Next MD Visit  01/02/2018    Prior Therapy  yes in past for back                   [facility] Adult PT Treatment/Exercise - 12/24/17 0742      Shoulder Exercises: Standing   External Rotation  Strengthening;10 reps;20 reps;Theraband    Theraband Level (Shoulder External Rotation)  Level 2 (Red)    Extension  Strengthening;20 reps;10 reps;Limitations    Extension Limitations  Pink XTS    Row  Strengthening;Both;10 reps;20 reps;Limitations    Row Limitations  Pink XTS    Other Standing Exercises  Mid rows Pink XTS 3x10    Other Standing Exercises  mug to bottom and middle shelf x20      Shoulder Exercises: ROM/Strengthening   UBE (Upper Arm Bike)  30 RPM x10 min (5' forward, 5' backward)    Wall Pushups  20 reps      Vasopneumatic   Number Minutes Vasopneumatic   15 minutes    Vasopnuematic Location   Shoulder    Vasopneumatic Pressure  Low    Vasopneumatic Temperature   34                  PT Long Term Goals - 11/28/17 1659      PT LONG TERM GOAL #1   Title  Pt will be independent with a HEP and progression.     Time  8    Period  Weeks    Status  Achieved      PT LONG TERM GOAL #2   Title  Pt will improve his R SLS to >/= 30 seconds to improve balance and functional mobility.     Time  8    Period  Weeks    Status  On-going      PT LONG TERM GOAL #3   Title  Walk with normal gait pattern with no pain reported.     Time  8    Period  Weeks    Status  On-going      PT LONG TERM GOAL #4   Title  Pt will improve his R shoulder flexion to >/= 150 degrees.     Time  8    Period  Weeks    Status  Achieved      PT LONG TERM GOAL #5   Title  Pt will improve his R grip strength to >/= 40 ppsi.     Time  8    Period  Weeks    Status  Achieved            Plan - 12/24/17 1744    Clinical Impression Statement  Patient was able to complete exercises despite being sore at the start of session. Patient demos good form with all exercises despite reports of muscle fatigue. Normal response to modalities at end of session.     Clinical Presentation  Stable    Clinical Decision Making  Low    Rehab Potential  Good    Clinical  Impairments Affecting Rehab Potential  surgery 07/15/17 current 16 weeks 11/04/17    PT Frequency  3x / week    PT Duration  4 weeks    PT Treatment/Interventions  ADLs/Self Care Home Management;Cryotherapy;Electrical Stimulation;Therapeutic exercise;Therapeutic activities;Functional mobility training;Stair training;Gait training;Ultrasound;Balance training;Neuromuscular re-education;Patient/family education;Manual techniques;Passive range of motion;Taping    PT Next Visit Plan  Continue right shoulder strengthening focus on shoulder flexion with respect to no >90 degrees    Consulted and Agree with Plan of Care  Patient       Patient will benefit from skilled therapeutic intervention in order to improve the following deficits and impairments:  Pain, Impaired UE functional use, Decreased range of motion, Decreased endurance, Decreased strength  Visit Diagnosis: Acute pain of right shoulder  Stiffness of right shoulder, not elsewhere classified     Problem List Patient Active Problem List   Diagnosis Date Noted  . Diverticulitis of colon 05/03/2015    Gabriela Eves, PT, DPT 12/25/2017, 7:56 AM  [facility] [address] [place], [place], [address] Phone: [phone]   Fax:  [phone]  Name: Aaron Krause MRN: [id number] Date of Birth: [birth year]

## 2017-12-26 ENCOUNTER — Ambulatory Visit: Payer: PRIVATE HEALTH INSURANCE | Admitting: Physical Therapy

## 2017-12-26 DIAGNOSIS — M25611 Stiffness of right shoulder, not elsewhere classified: Secondary | ICD-10-CM

## 2017-12-26 DIAGNOSIS — M25511 Pain in right shoulder: Secondary | ICD-10-CM

## 2017-12-26 NOTE — Therapy (Signed)
[facility] [place], [place], [address] Phone: [phone]   Fax:  [phone]  Physical Therapy Treatment  Patient Details  Name: Aaron Krause MRN: [id number] Date of Birth: 1966/06/19 Referring Provider: Susa Day, MD   Encounter Date: 12/26/2017  PT End of Session - 12/26/17 1707    Visit Number  67    Number of Visits  61    Date for PT Re-Evaluation  12/30/17    PT Start Time  1645    PT Stop Time  1739    PT Time Calculation (min)  54 min    Activity Tolerance  Patient tolerated treatment well    Behavior During Therapy  [facility] for tasks assessed/performed       Past Medical History:  Diagnosis Date  . Arthritis   . Diverticulitis    with perforation reported  . Ruptured disk   . Trimalleolar fracture of right ankle     Past Surgical History:  Procedure Laterality Date  . ABDOMINAL SURGERY     diverticulitis perforation  . ORIF ANKLE FRACTURE Right 06/06/2017   Procedure: OPEN REDUCTION INTERNAL FIXATION (ORIF) ANKLE FRACTURE;  Surgeon: Wylene Simmer, MD;  Location: [place];  Service: Orthopedics;  Laterality: Right;  . TOTAL HIP ARTHROPLASTY      There were no vitals filed for this visit.  Subjective Assessment - 12/26/17 1734    Subjective  Patient reported feeling alright. He reported his doctor's appointment is on Monday 12/30/17.    Pertinent History  s/p right trimalleolar fx s/p surgery on 06/06/17, s/p R rotator cuff repair on 07/15/17.     Patient Stated Goals  get back to work; improve strength    Currently in Pain?  No/denies    Pain Onset  More than a month ago         [facility] PT Assessment - 12/26/17 0001      Assessment   Medical Diagnosis  Trimalleolar Fx/dislocation right ankle, R rotator cuffer replair    Onset Date/Surgical Date  07/15/17    Hand Dominance  Right    Next MD Visit  12/30/17    Prior Therapy  yes in past for back                    [facility] Adult PT Treatment/Exercise - 12/26/17 0001      Shoulder Exercises: Standing   External Rotation  Strengthening;10 reps;20 reps;Theraband    Theraband Level (Shoulder External Rotation)  Level 2 (Red)    Extension  Strengthening;20 reps;10 reps;Limitations    Extension Limitations  Pink XTS    Row  Strengthening;Both;10 reps;20 reps;Limitations    Row Limitations  Pink XTS    Other Standing Exercises  Mid rows Pink XTS 3x10    Other Standing Exercises  mug to bottom and middle shelf 3x10      Shoulder Exercises: ROM/Strengthening   UBE (Upper Arm Bike)  30 RPM x10 min (5' forward, 5' backward)      Shoulder Exercises: Body Blade   Flexion  30 seconds;3 reps    External Rotation  30 seconds;3 reps      Vasopneumatic   Number Minutes Vasopneumatic   15 minutes    Vasopnuematic Location   Shoulder    Vasopneumatic Pressure  Low    Vasopneumatic Temperature   34                  PT Long Term Goals -  11/28/17 1659      PT LONG TERM GOAL #1   Title  Pt will be independent with a HEP and progression.     Time  8    Period  Weeks    Status  Achieved      PT LONG TERM GOAL #2   Title  Pt will improve his R SLS to >/= 30 seconds to improve balance and functional mobility.     Time  8    Period  Weeks    Status  On-going      PT LONG TERM GOAL #3   Title  Walk with normal gait pattern with no pain reported.     Time  8    Period  Weeks    Status  On-going      PT LONG TERM GOAL #4   Title  Pt will improve his R shoulder flexion to >/= 150 degrees.     Time  8    Period  Weeks    Status  Achieved      PT LONG TERM GOAL #5   Title  Pt will improve his R grip strength to >/= 40 ppsi.     Time  8    Period  Weeks    Status  Achieved            Plan - 12/26/17 1735    Clinical Impression Statement  Patient was able to complete exercises with some reports of muscle fatigue, especially with body blade exercises. Patient  cont to demonstrate shoulder elevation compensation but towards the later end of the 3rd set of mug to shelf exercise. Normal response to modalities upon removal.    Clinical Presentation  Stable    Clinical Decision Making  Low    Rehab Potential  Good    Clinical Impairments Affecting Rehab Potential  surgery 07/15/17 current 16 weeks 11/04/17    PT Frequency  3x / week    PT Duration  4 weeks    PT Treatment/Interventions  ADLs/Self Care Home Management;Cryotherapy;Electrical Stimulation;Therapeutic exercise;Therapeutic activities;Functional mobility training;Stair training;Gait training;Ultrasound;Balance training;Neuromuscular re-education;Patient/family education;Manual techniques;Passive range of motion;Taping    PT Next Visit Plan  Assess goals. Continue right shoulder strengthening focus on shoulder flexion with respect to no >90 degrees    Consulted and Agree with Plan of Care  Patient       Patient will benefit from skilled therapeutic intervention in order to improve the following deficits and impairments:  Pain, Impaired UE functional use, Decreased range of motion, Decreased endurance, Decreased strength  Visit Diagnosis: Acute pain of right shoulder  Stiffness of right shoulder, not elsewhere classified     Problem List Patient Active Problem List   Diagnosis Date Noted  . Diverticulitis of colon 05/03/2015    Gabriela Eves, PT, DPT 12/26/2017, 5:42 PM  [facility] [address] [place], [place], [address] Phone: [phone]   Fax:  [phone]  Name: Aaron Krause MRN: [id number] Date of Birth: 04-07-1966

## 2017-12-27 ENCOUNTER — Ambulatory Visit: Payer: PRIVATE HEALTH INSURANCE | Admitting: Physical Therapy

## 2017-12-27 DIAGNOSIS — M25511 Pain in right shoulder: Secondary | ICD-10-CM

## 2017-12-27 DIAGNOSIS — M25611 Stiffness of right shoulder, not elsewhere classified: Secondary | ICD-10-CM

## 2017-12-27 NOTE — Therapy (Addendum)
[facility] [place], [place], [address] Phone: [phone]   Fax:  [phone]  Physical Therapy Treatment PHYSICAL THERAPY DISCHARGE SUMMARY  Visits from Start of Care: 18  Current functional level related to goals / functional outcomes: See below   Remaining deficits: See goals   Education / Equipment: HEP Plan: Patient agrees to discharge.  Patient goals were partially met. Patient is being discharged due to meeting the stated rehab goals.  ?????   Gabriela Eves, PT, DPT   Patient Details  Name: Aaron Krause MRN: [id number] Date of Birth: 1965/11/17 Referring Provider: Susa Day, MD   Encounter Date: 12/27/2017  PT End of Session - 12/27/17 0741    Visit Number  71    Number of Visits  61    Date for PT Re-Evaluation  12/30/17    PT Start Time  0731    PT Stop Time  0816    PT Time Calculation (min)  45 min    Activity Tolerance  Patient tolerated treatment well    Behavior During Therapy  [facility] for tasks assessed/performed       Past Medical History:  Diagnosis Date  . Arthritis   . Diverticulitis    with perforation reported  . Ruptured disk   . Trimalleolar fracture of right ankle     Past Surgical History:  Procedure Laterality Date  . ABDOMINAL SURGERY     diverticulitis perforation  . ORIF ANKLE FRACTURE Right 06/06/2017   Procedure: OPEN REDUCTION INTERNAL FIXATION (ORIF) ANKLE FRACTURE;  Surgeon: Wylene Simmer, MD;  Location: [place];  Service: Orthopedics;  Laterality: Right;  . TOTAL HIP ARTHROPLASTY      There were no vitals filed for this visit.  Subjective Assessment - 12/26/17 1734    Subjective  Patient reported feeling alright. He reported his doctor's appointment is on Monday 12/30/17.    Pertinent History  s/p right trimalleolar fx s/p surgery on 06/06/17, s/p R rotator cuff repair on 07/15/17.     Patient Stated Goals  get back to work; improve strength     Currently in Pain?  No/denies    Pain Onset  More than a month ago         [facility] PT Assessment - 12/27/17 0001      Assessment   Medical Diagnosis  Trimalleolar Fx/dislocation right ankle, R rotator cuffer replair    Onset Date/Surgical Date  07/15/17    Hand Dominance  Right    Next MD Visit  12/30/17    Prior Therapy  yes in past for back      AROM   Right Shoulder Flexion  80 Degrees    Right Shoulder ABduction  88 Degrees    Right Shoulder External Rotation  58 Degrees at 45 abd      Strength   Right Shoulder Flexion  3+/5    Right Shoulder ABduction  3+/5    Right Shoulder Internal Rotation  4/5    Right Shoulder External Rotation  5/5                   [facility] Adult PT Treatment/Exercise - 12/27/17 0001      Shoulder Exercises: Standing   Internal Rotation  Strengthening;Right;20 reps;Theraband;10 reps    Theraband Level (Shoulder Internal Rotation)  Level 2 (Red)    Extension  Strengthening;20 reps;10 reps;Limitations    Extension Limitations  Pink XTS    Row  Strengthening;Both;10 reps;20 reps;Limitations  Row Limitations  Pink XTS    Other Standing Exercises  Mid rows Pink XTS 3x10 followed by scapular clocks with 2# medicine ball (3,4,5,6) 2x10    Other Standing Exercises  1# weight to bottom and middle shelf 3x10      Shoulder Exercises: ROM/Strengthening   UBE (Upper Arm Bike)  30 RPM x10 min (5' forward, 5' backward)    Wall Pushups  20 reps      Vasopneumatic   Number Minutes Vasopneumatic   --                  PT Long Term Goals - 12/27/17 1220      PT LONG TERM GOAL #1   Title  Pt will be independent with a HEP and progression.     Time  8    Period  Weeks    Status  Achieved      PT LONG TERM GOAL #2   Title  Pt will improve his R SLS to >/= 30 seconds to improve balance and functional mobility.     Baseline  currently only able to SLS for about 10-15 seconds    Time  8    Period  Weeks    Status  On-going      PT  LONG TERM GOAL #3   Title  Walk with normal gait pattern with no pain reported.     Time  8    Period  Weeks    Status  Partially Met      PT LONG TERM GOAL #4   Title  Pt will improve his R shoulder flexion to >/= 150 degrees.     Time  8    Period  Weeks    Status  Achieved AROM 88      PT LONG TERM GOAL #5   Title  Pt will improve his R grip strength to >/= 40 ppsi.     Time  8    Period  Weeks    Status  Achieved            Plan - 12/27/17 4098    Clinical Impression Statement  Patient was able to complete exercises with progression of exercies. Patient noted with decreased right shoulder strength (within patient's ROM protocol). Patient continues to demonstrate right shoulder elevation compensation especially when he is fatigued. Continue PT pending MD and worker's compensation approval.    Clinical Presentation  Stable    Clinical Decision Making  Low    Rehab Potential  Good    Clinical Impairments Affecting Rehab Potential  surgery 07/15/17 current 16 weeks 11/04/17    PT Frequency  3x / week    PT Duration  4 weeks    PT Treatment/Interventions  ADLs/Self Care Home Management;Cryotherapy;Electrical Stimulation;Therapeutic exercise;Therapeutic activities;Functional mobility training;Stair training;Gait training;Ultrasound;Balance training;Neuromuscular re-education;Patient/family education;Manual techniques;Passive range of motion;Taping    PT Next Visit Plan  Continue pending MD and worker's compensation approval.    Consulted and Agree with Plan of Care  Patient       Patient will benefit from skilled therapeutic intervention in order to improve the following deficits and impairments:  Pain, Impaired UE functional use, Decreased range of motion, Decreased endurance, Decreased strength  Visit Diagnosis: Acute pain of right shoulder  Stiffness of right shoulder, not elsewhere classified     Problem List Patient Active Problem List   Diagnosis Date Noted  .  Diverticulitis of colon 05/03/2015   Gabriela Eves, PT, DPT 12/27/2017,  12:31 PM  [facility] [place], [place], [address] Phone: [phone]   Fax:  [phone]  Name: Aaron Krause MRN: [id number] Date of Birth: [birth year]

## 2018-05-02 IMAGING — DX DG SHOULDER 2+V PORT*R*
1 series · 1 of 1 positions shown · non-contrast
Comparison: None.

CLINICAL DATA: 51-year-old male with right shoulder pain post fall.
Initial encounter.

EXAM:
PORTABLE RIGHT SHOULDER

[shoulder ap]
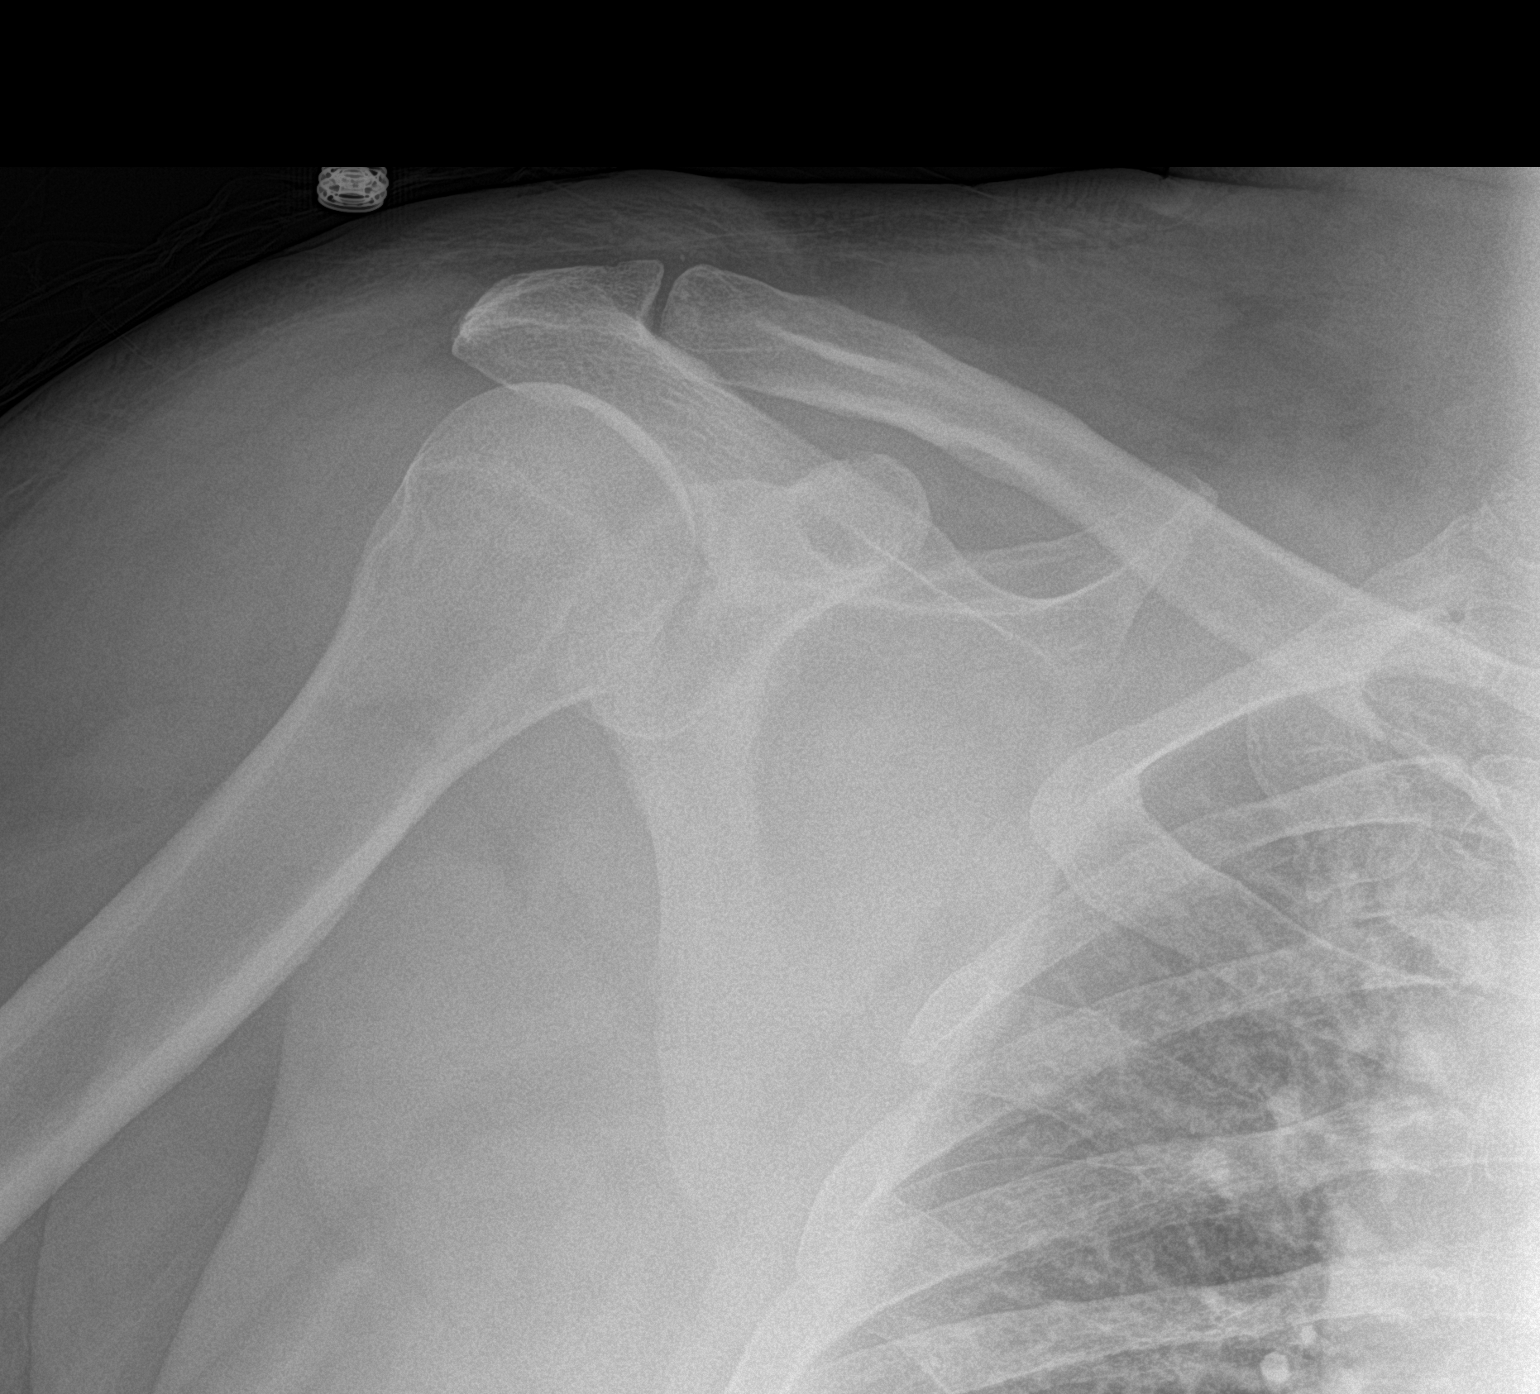

[1 of 1 positions shown; findings below may reference images not displayed]

FINDINGS: Single portable view of the right shoulder was only able to be
obtained. On this limited exam, no right shoulder fracture or
dislocation is noted. Follow-up imaging may be considered if the
patient has persistent pain and can tolerate standard shoulder
series.
IMPRESSION: Single portable view of the right shoulder without fracture or
dislocation detected as detailed above.

## 2018-05-02 IMAGING — DX DG ANKLE PORT 2V*R*
1 series · 1 of 1 positions shown · non-contrast
Comparison: Plain film from earlier same day.

CLINICAL DATA: Post reduction.

EXAM:
PORTABLE RIGHT ANKLE - 2 VIEW

[ankle lat]
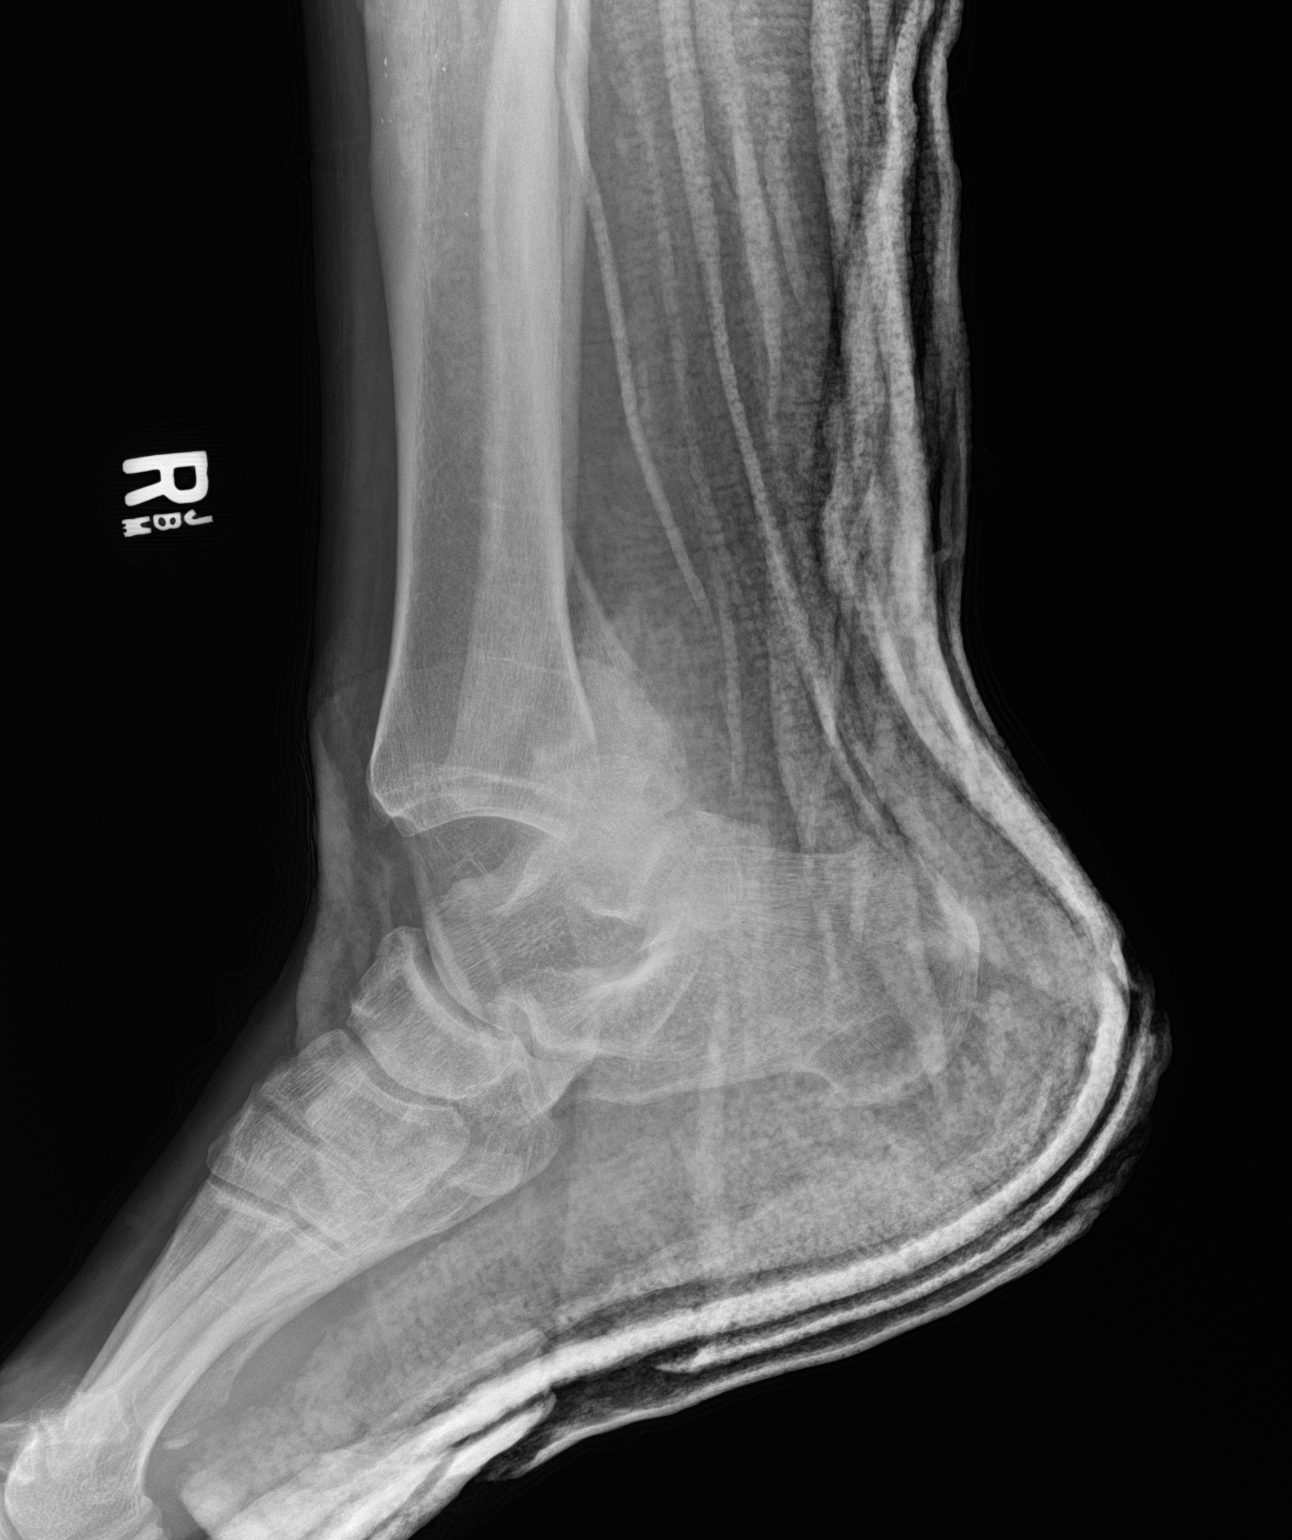

[1 of 1 positions shown; findings below may reference images not displayed]

FINDINGS: Single lateral view is provided. No appreciable change in alignment
status post reduction and casting.
IMPRESSION: No appreciable change in alignment on this single lateral view.
Please see earlier ankle reports, with AP and lateral views, for
detailed characterization of the fractures.

## 2018-05-28 IMAGING — DX DG CHEST 2V
2 series · 2 of 2 positions shown · non-contrast
Comparison: 05/20/17

CLINICAL DATA: Shortness of Breath

EXAM:
CHEST  2 VIEW

[w chest lat]
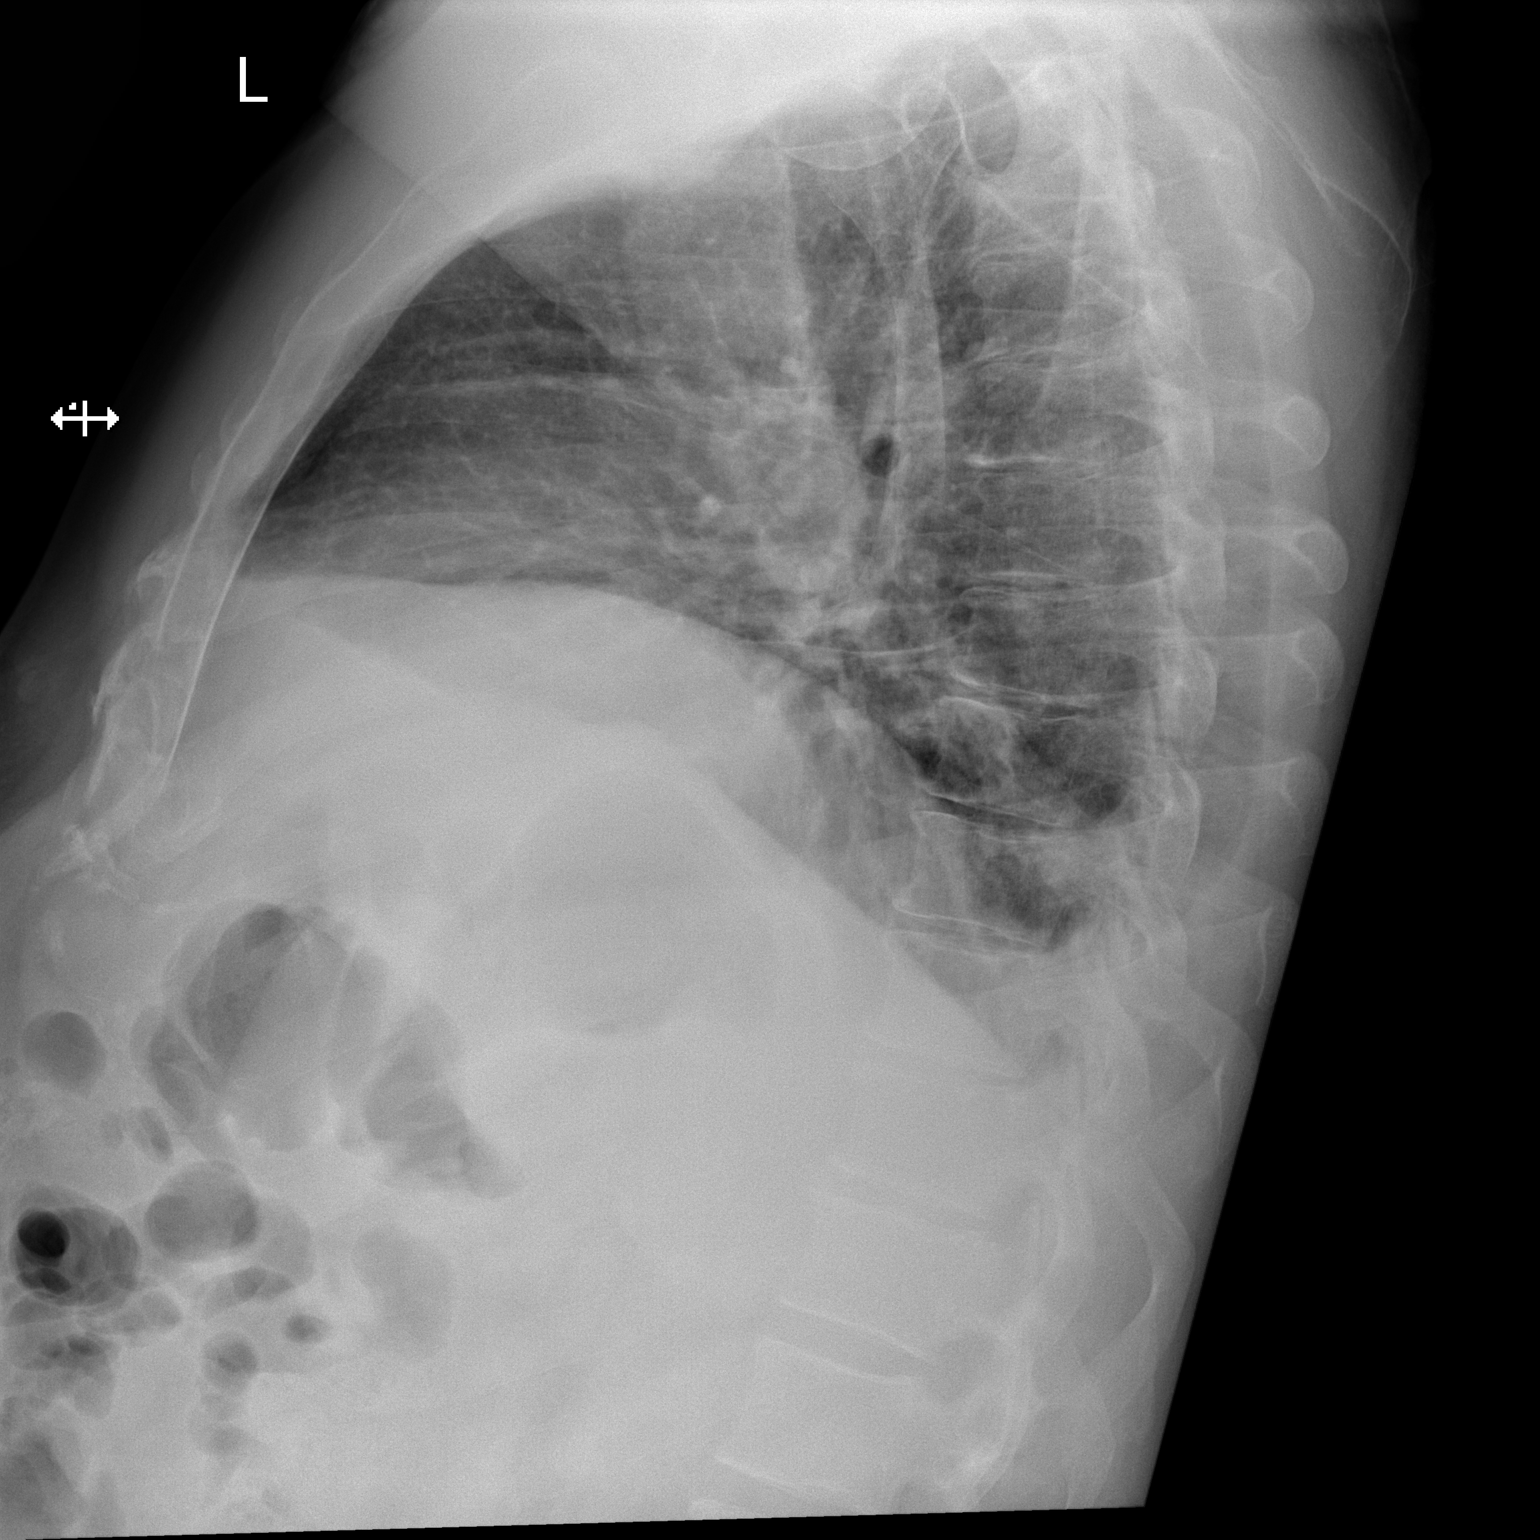

[x chest ap]
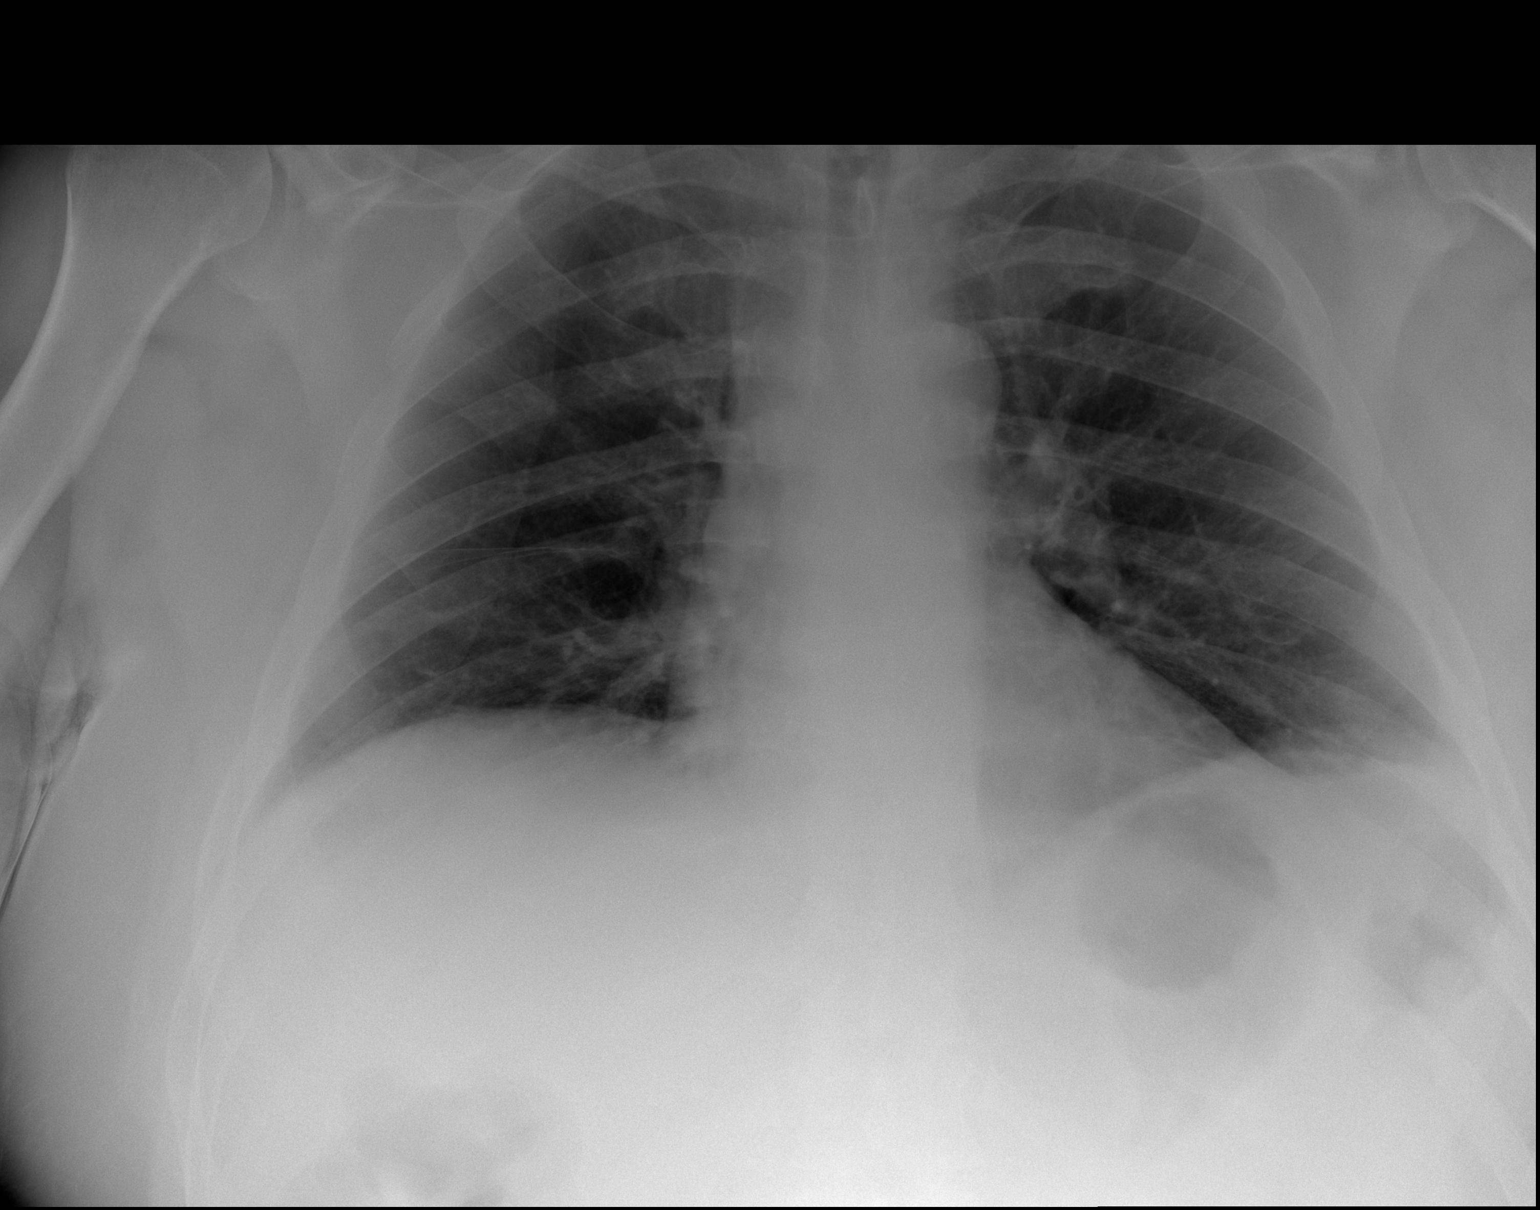

[2 of 2 positions shown; findings below may reference images not displayed]

FINDINGS: Cardiac shadow is within normal limits. Lungs are well aerated
bilaterally with evidence of very minimal effusions posteriorly as
well as some mild bibasilar atelectasis. No bony abnormality is
seen.
IMPRESSION: Bibasilar atelectatic changes and small effusions posteriorly.

## 2020-09-14 ENCOUNTER — Encounter: Payer: Self-pay | Admitting: Gastroenterology
